# Patient Record
Sex: Male | Born: 1950 | Race: White | Hispanic: No | Marital: Married | Smoking: Former smoker
Health system: Southern US, Community
[De-identification: ages and names within clinical notes are randomized; demographics above are authoritative.]

## PROBLEM LIST (undated history)

## (undated) DIAGNOSIS — I10 Essential (primary) hypertension: Secondary | ICD-10-CM

## (undated) DIAGNOSIS — E119 Type 2 diabetes mellitus without complications: Secondary | ICD-10-CM

## (undated) DIAGNOSIS — I639 Cerebral infarction, unspecified: Secondary | ICD-10-CM

## (undated) DIAGNOSIS — I2510 Atherosclerotic heart disease of native coronary artery without angina pectoris: Secondary | ICD-10-CM

## (undated) DIAGNOSIS — C801 Malignant (primary) neoplasm, unspecified: Secondary | ICD-10-CM

## (undated) HISTORY — PX: OTHER SURGICAL HISTORY: SHX169

## (undated) HISTORY — PX: TUMOR EXCISION: SHX421

---

## 2013-08-10 DIAGNOSIS — L719 Rosacea, unspecified: Secondary | ICD-10-CM | POA: Insufficient documentation

## 2016-10-06 DIAGNOSIS — I639 Cerebral infarction, unspecified: Secondary | ICD-10-CM | POA: Insufficient documentation

## 2016-10-06 DIAGNOSIS — E785 Hyperlipidemia, unspecified: Secondary | ICD-10-CM | POA: Insufficient documentation

## 2016-11-26 DIAGNOSIS — I6509 Occlusion and stenosis of unspecified vertebral artery: Secondary | ICD-10-CM | POA: Insufficient documentation

## 2016-12-08 DIAGNOSIS — R7989 Other specified abnormal findings of blood chemistry: Secondary | ICD-10-CM | POA: Insufficient documentation

## 2016-12-08 DIAGNOSIS — R972 Elevated prostate specific antigen [PSA]: Secondary | ICD-10-CM | POA: Insufficient documentation

## 2016-12-08 DIAGNOSIS — N419 Inflammatory disease of prostate, unspecified: Secondary | ICD-10-CM | POA: Insufficient documentation

## 2018-09-23 DIAGNOSIS — I6930 Unspecified sequelae of cerebral infarction: Secondary | ICD-10-CM | POA: Insufficient documentation

## 2018-09-28 DIAGNOSIS — H2512 Age-related nuclear cataract, left eye: Secondary | ICD-10-CM | POA: Insufficient documentation

## 2018-10-19 DIAGNOSIS — H2511 Age-related nuclear cataract, right eye: Secondary | ICD-10-CM | POA: Insufficient documentation

## 2019-05-10 DIAGNOSIS — M5416 Radiculopathy, lumbar region: Secondary | ICD-10-CM | POA: Insufficient documentation

## 2019-05-10 DIAGNOSIS — M545 Low back pain, unspecified: Secondary | ICD-10-CM | POA: Insufficient documentation

## 2019-05-10 DIAGNOSIS — M47816 Spondylosis without myelopathy or radiculopathy, lumbar region: Secondary | ICD-10-CM | POA: Insufficient documentation

## 2019-06-28 DIAGNOSIS — F418 Other specified anxiety disorders: Secondary | ICD-10-CM | POA: Insufficient documentation

## 2020-02-22 ENCOUNTER — Ambulatory Visit
Admission: EM | Admit: 2020-02-22 | Discharge: 2020-02-22 | Disposition: A | Discharge status: Home / Self Care | Payer: Medicare HMO

## 2020-02-22 ENCOUNTER — Other Ambulatory Visit: Payer: Self-pay

## 2020-02-22 ENCOUNTER — Encounter: Payer: Self-pay | Admitting: Emergency Medicine

## 2020-02-22 DIAGNOSIS — S61402A Unspecified open wound of left hand, initial encounter: Secondary | ICD-10-CM

## 2020-02-22 DIAGNOSIS — L03011 Cellulitis of right finger: Secondary | ICD-10-CM | POA: Diagnosis not present

## 2020-02-22 HISTORY — DX: Essential (primary) hypertension: I10

## 2020-02-22 HISTORY — DX: Type 2 diabetes mellitus without complications: E11.9

## 2020-02-22 HISTORY — DX: Malignant (primary) neoplasm, unspecified: C80.1

## 2020-02-22 HISTORY — DX: Cerebral infarction, unspecified: I63.9

## 2020-02-22 HISTORY — DX: Atherosclerotic heart disease of native coronary artery without angina pectoris: I25.10

## 2020-02-22 MED ORDER — TETANUS-DIPHTH-ACELL PERTUSSIS 5-2.5-18.5 LF-MCG/0.5 IM SUSP
0.5000 mL | Freq: Once | INTRAMUSCULAR | Status: AC
  Administered 2020-02-22: 20:00:00 0.5 mL via INTRAMUSCULAR

## 2020-02-22 MED ORDER — DOXYCYCLINE HYCLATE 100 MG PO CAPS: 100.0000 mg | ORAL_CAPSULE | Freq: Two times a day (BID) | ORAL | 0 refills | Status: DC

## 2020-02-22 NOTE — ED Provider Notes (Signed)
[place]   [id number] 02/22/20 Arrival Time: [id number]  CC:  Skin infection; wound check  SUBJECTIVE:  Robert Brown is a 69 y.o. male who presents with a possible infection of RT ring finger x 4 days. Had hang nail he was picking at, and then started having redness and swelling.  Localizes the infection to RT ring finger.  Describes it as painful, red, and swelling.  Has tried expressing/ picking at it at home without relief.  Symptoms are made worse to the touch.  Denies fever, chills, nausea, vomiting, changes in bowel or bladder function.    Also reports wound to left hand x 1 day.  Occurred after catching inside of hand on screen door.  Has tried covering, but bandage always comes off.    Unknown last tetanus.    ROS: As per HPI.  All other pertinent ROS negative.     Past Medical History:  Diagnosis Date  . CAD (coronary artery disease)   . Cancer ([place])   . Diabetes mellitus without complication ([place])   . Hypertension   . Stroke [facility])    Past Surgical History:  Procedure Laterality Date  . bone grafts    . TUMOR EXCISION     No Known Allergies No current facility-administered medications on file prior to encounter.   Current Outpatient Medications on File Prior to Encounter  Medication Sig Dispense Refill  . finasteride (PROSCAR) 5 MG tablet     . isosorbide mononitrate (IMDUR) 30 MG 24 hr tablet Take by mouth.    . losartan (COZAAR) 100 MG tablet Take by mouth.    . metFORMIN (GLUCOPHAGE-XR) 500 MG 24 hr tablet TAKE 2 TABLETS BY MOUTH WITH BREAKFAST    . aspirin 325 MG tablet Take by mouth.    . tamsulosin (FLOMAX) 0.4 MG CAPS capsule Take 0.4 mg by mouth 2 (two) times daily.     Social History   Socioeconomic History  . Marital status: Married    Spouse name: Not on file  . Number of children: Not on file  . Years of education: Not on file  . Highest education level: Not on file  Occupational History  . Not on file  Tobacco Use  . Smoking status:  Never Smoker  Substance and Sexual Activity  . Alcohol use: Never  . Drug use: Never  . Sexual activity: Not on file  Other Topics Concern  . Not on file  Social History Narrative  . Not on file   Social Determinants of Health   Financial Resource Strain:   . Difficulty of Paying Living Expenses:   Food Insecurity:   . Worried About Charity fundraiser in the Last Year:   . Arboriculturist in the Last Year:   Transportation Needs:   . Film/video editor (Medical):   Marland Kitchen Lack of Transportation (Non-Medical):   Physical Activity:   . Days of Exercise per Week:   . Minutes of Exercise per Session:   Stress:   . Feeling of Stress :   Social Connections:   . Frequency of Communication with Friends and Family:   . Frequency of Social Gatherings with Friends and Family:   . Attends Religious Services:   . Active Member of Clubs or Organizations:   . Attends Archivist Meetings:   Marland Kitchen Marital Status:   Intimate Partner Violence:   . Fear of Current or Ex-Partner:   . Emotionally Abused:   Marland Kitchen Physically Abused:   .  Sexually Abused:    Family History  Problem Relation Age of Onset  . Diabetes Mother   . Diabetes Father     OBJECTIVE: Vitals:   02/22/20 1804  BP: (!) 162/83  Pulse: 75  Resp: 20  Temp: 97.6 F (36.4 C)  TempSrc: Oral  SpO2: 92%    General appearance: alert; no distress Head: NCAT Lungs: Normal respiratory effort Heart:  Radial pulse 2+ bilaterally Extremities: no edema Skin: warm and dry; paronychia present to medial aspect of RT fourth digit with surrounding erythema and swelling, no obvious drainage or bleeding, scab formation; healed avulsion skin tear < 1 cm in length to medial aspect of LT hand, scab formation, no obvious bleeding or discharge Psychological: alert and cooperative; normal mood and affect  ASSESSMENT & PLAN:  1. Acute paronychia of finger of right hand   2. Avulsion of skin of left hand, initial encounter     Meds  ordered this encounter  Medications  . doxycycline (VIBRAMYCIN) 100 MG capsule    Sig: Take 1 capsule (100 mg total) by mouth 2 (two) times daily.    Dispense:  20 capsule    Refill:  0    Order Specific Question:   Supervising Provider    Answer:   Raylene Everts [id number]    Perform frequent warm soak to help facilitate drainage Wash daily with warm water and mild soap Keep covered to avoid secondary infection Dressing applied Doxycycline prescribed for infection.  Take as directed and to completion Use OTC ibuprofen/tylenol as needed for pain  Return or go to the ED if you have any new or worsening symptoms such as worsening toe pain, nausea, vomiting, increased redness, swelling, fever, chills, etc...  Reviewed expectations re: course of current medical issues. Questions answered. Outlined signs and symptoms indicating need for more acute intervention. Patient verbalized understanding. After Visit Summary given.   Lestine Box, PA-C 02/22/20 1909

## 2020-02-22 NOTE — Discharge Instructions (Signed)
Perform frequent warm soak to help facilitate drainage Wash daily with warm water and mild soap Keep covered to avoid secondary infection Dressing applied Doxycycline prescribed for infection.  Take as directed and to completion Use OTC ibuprofen/tylenol as needed for pain  Return or go to the ED if you have any new or worsening symptoms such as worsening toe pain, nausea, vomiting, increased redness, swelling, fever, chills, etc..Marland Kitchen

## 2020-02-22 NOTE — ED Triage Notes (Addendum)
Patient has a paronychia to right ring finger.  Patient thought there was a hang nail, but pulled it.  Now there is redness, swelling and pain to area around nail.  This injury for 4 days.    Left hand has a skin laceration to lateral left hand, just behind little finger. Looks like a skin tear, wound looks closed.  This occurred yesterday .  Injured with a screen door  Unknown last tetanus

## 2020-03-06 ENCOUNTER — Ambulatory Visit
Admission: EM | Admit: 2020-03-06 | Discharge: 2020-03-06 | Disposition: A | Discharge status: Home / Self Care | Payer: Medicare HMO | Attending: Emergency Medicine | Admitting: Emergency Medicine

## 2020-03-06 ENCOUNTER — Other Ambulatory Visit: Payer: Self-pay

## 2020-03-06 ENCOUNTER — Encounter: Payer: Self-pay | Admitting: Emergency Medicine

## 2020-03-06 ENCOUNTER — Ambulatory Visit (INDEPENDENT_AMBULATORY_CARE_PROVIDER_SITE_OTHER): Payer: Medicare HMO

## 2020-03-06 DIAGNOSIS — L03011 Cellulitis of right finger: Secondary | ICD-10-CM

## 2020-03-06 MED ORDER — CEFTRIAXONE SODIUM 1 G IJ SOLR
1.0000 g | Freq: Once | INTRAMUSCULAR | Status: AC
  Administered 2020-03-06: 1 g via INTRAMUSCULAR

## 2020-03-06 MED ORDER — CEPHALEXIN 500 MG PO CAPS: 500.0000 mg | ORAL_CAPSULE | Freq: Four times a day (QID) | ORAL | 0 refills | Status: DC

## 2020-03-06 NOTE — Discharge Instructions (Addendum)
Perform frequent warm soaks to help facilitate drainage Wash daily with warm water and mild soap Keep covered to avoid secondary infection Dressing applied Rocephin 1 g was given in office keflex will be prescribed Use OTC ibuprofen/Tylenol as needed for pain relief Return or go to ED if you have any new or worsening symptoms such as worsening pain, nausea, vomiting, increased redness, swelling, fever, chills, etc..Marland KitchenMarland Kitchen

## 2020-03-06 NOTE — ED Provider Notes (Signed)
[facility]    CSN: [id number] Arrival date & time: 03/06/20  1317      History   Chief Complaint Chief Complaint  Patient presents with  . Finger Injury    HPI Robert Brown is a 69 y.o. male.   With hx of Diabetes present to the urgent care with a possible infection of the right ring finger for the past 17 days.  Was seen previously seen at the urgent care on 02/22/2020 and was prescribed doxycycline.  Reports symptom has been getting worse.  Localized infection to the right ring finger.  Described as painful red and swollen.  Has tried to express it at home without relief.  Symptoms are made worse to touch.  Denies fever, chills, nausea, vomiting, diarrhea, change in bladder function.  The history is provided by the patient. No language interpreter was used.    Past Medical History:  Diagnosis Date  . CAD (coronary artery disease)   . Cancer ([place])   . Diabetes mellitus without complication ([place])   . Hypertension   . Stroke [facility])     There are no problems to display for this patient.   Past Surgical History:  Procedure Laterality Date  . bone grafts    . TUMOR EXCISION         Home Medications    Prior to Admission medications   Medication Sig Start Date End Date Taking? Authorizing Provider  aspirin 325 MG tablet Take by mouth.    [provider]  doxycycline (VIBRAMYCIN) 100 MG capsule Take 1 capsule (100 mg total) by mouth 2 (two) times daily. 02/22/20   Wurst, [place], PA-C  finasteride (PROSCAR) 5 MG tablet  02/09/20   [provider]  isosorbide mononitrate (IMDUR) 30 MG 24 hr tablet Take by mouth. 01/26/20   [provider]  losartan (COZAAR) 100 MG tablet Take by mouth. 01/12/20   [provider]  metFORMIN (GLUCOPHAGE-XR) 500 MG 24 hr tablet TAKE 2 TABLETS BY MOUTH WITH BREAKFAST 01/12/20   [provider]  tamsulosin (FLOMAX) 0.4 MG CAPS capsule Take 0.4 mg by mouth 2 (two) times daily. 12/23/19    [provider]    Family History Family History  Problem Relation Age of Onset  . Diabetes Mother   . Diabetes Father     Social History Social History   Tobacco Use  . Smoking status: Never Smoker  Substance Use Topics  . Alcohol use: Never  . Drug use: Never     Allergies   Patient has no known allergies.   Review of Systems Review of Systems  Constitutional: Negative.   Respiratory: Negative.   Cardiovascular: Negative.   Skin: Positive for color change and wound.  All other systems reviewed and are negative.    Physical Exam Triage Vital Signs ED Triage Vitals  Enc Vitals Group     BP      Pulse      Resp      Temp      Temp src      SpO2      Weight      Height      Head Circumference      Peak Flow      Pain Score      Pain Loc      Pain Edu?      Excl. in [place]?    No data found.  Updated Vital Signs BP (!) 164/80 (BP Location: Right Arm)  Pulse 71   Temp 97.8 F (36.6 C) (Oral)   Resp 18   SpO2 96%   Visual Acuity Right Eye Distance:   Left Eye Distance:   Bilateral Distance:    Right Eye Near:   Left Eye Near:    Bilateral Near:     Physical Exam Vitals and nursing note reviewed.  Constitutional:      General: He is not in acute distress.    Appearance: Normal appearance. He is normal weight. He is not ill-appearing, toxic-appearing or diaphoretic.  Cardiovascular:     Rate and Rhythm: Normal rate and regular rhythm.     Pulses: Normal pulses.     Heart sounds: Normal heart sounds. No murmur. No friction rub. No gallop.   Pulmonary:     Effort: Pulmonary effort is normal. No respiratory distress.     Breath sounds: Normal breath sounds. No stridor. No wheezing, rhonchi or rales.  Chest:     Chest wall: No tenderness.  Musculoskeletal:     Right hand: Swelling and tenderness present. Normal sensation.  Skin:    General: Skin is warm.     Findings: Erythema present.     Comments: Paronychia present to medial  aspect of right fourth digit with surrounding erythema and swelling.  No obvious drainage.  Bleeding present.  Scab was removed.  Neurological:     Mental Status: He is alert.      UC Treatments / Results  Labs (all labs ordered are listed, but only abnormal results are displayed) Labs Reviewed - No data to display  EKG   Radiology DG Finger Ring Right  Result Date: 03/06/2020 CLINICAL DATA:  Worsening infection around the right 4th fingernail. EXAM: RIGHT RING FINGER 2+V COMPARISON:  None. FINDINGS: There is no evidence of fracture or dislocation. There is no evidence of arthropathy or other focal bone abnormality. Soft tissues are unremarkable. IMPRESSION: Normal examination.  No soft tissue gas or changes of osteomyelitis. Electronically Signed   By: Claudie Revering M.D.   On: 03/06/2020 14:40    Procedures Procedures (including critical care time)  Medications Ordered in UC Medications  cefTRIAXone (ROCEPHIN) injection 1 g (1 g Intramuscular Given 03/06/20 1409)    Initial Impression / Assessment and Plan / UC Course  I have reviewed the triage vital signs and the nursing notes.  Pertinent labs & imaging results that were available during my care of the patient were reviewed by me and considered in my medical decision making (see chart for details).     X-ray is negative for soft tissue gas or changes of osteomyelitis.  I have reviewed the x-ray myself and the radiologist interpretation.  I am in agreement with the radiologist interpretation.  Patient is stable for discharge.  Rocephin 1 g IM was given in office.  Keflex will be prescribed.  Was advised to follow-up with PCP if symptoms does not resolve for further evaluation.  Final Clinical Impressions(s) / UC Diagnoses   Final diagnoses:  Acute paronychia of finger of right hand     Discharge Instructions     Perform frequent warm soaks to help facilitate drainage Wash daily with warm water and mild soap Keep  covered to avoid secondary infection Dressing applied Rocephin 1 g was given in office keflex will be prescribed Use OTC ibuprofen/Tylenol as needed for pain relief Return or go to ED if you have any new or worsening symptoms such as worsening pain, nausea, vomiting, increased redness, swelling, fever, chills,  etc..Marland KitchenMarland Kitchen    ED Prescriptions    None     PDMP not reviewed this encounter.   Emerson [place], [place] 03/06/20 1447

## 2020-03-06 NOTE — ED Triage Notes (Signed)
Pt sts seen here on 4/7 of infection around right ring finger nail; pt sts was given antibiotics but finger has increased pain and redness; pt with some drainage noted

## 2020-06-21 ENCOUNTER — Encounter (HOSPITAL_COMMUNITY): Payer: Self-pay | Admitting: Emergency Medicine

## 2020-06-21 ENCOUNTER — Emergency Department (HOSPITAL_COMMUNITY): Payer: Medicare HMO

## 2020-06-21 ENCOUNTER — Emergency Department (HOSPITAL_COMMUNITY)
Admission: EM | Admit: 2020-06-21 | Discharge: 2020-06-21 | Disposition: A | Discharge status: Home / Self Care | Payer: Medicare HMO | Attending: Emergency Medicine | Admitting: Emergency Medicine

## 2020-06-21 ENCOUNTER — Other Ambulatory Visit: Payer: Self-pay

## 2020-06-21 DIAGNOSIS — R509 Fever, unspecified: Secondary | ICD-10-CM | POA: Diagnosis not present

## 2020-06-21 DIAGNOSIS — I10 Essential (primary) hypertension: Secondary | ICD-10-CM | POA: Diagnosis not present

## 2020-06-21 DIAGNOSIS — Z7982 Long term (current) use of aspirin: Secondary | ICD-10-CM | POA: Diagnosis not present

## 2020-06-21 DIAGNOSIS — T63331A Toxic effect of venom of brown recluse spider, accidental (unintentional), initial encounter: Secondary | ICD-10-CM | POA: Insufficient documentation

## 2020-06-21 DIAGNOSIS — E119 Type 2 diabetes mellitus without complications: Secondary | ICD-10-CM | POA: Diagnosis not present

## 2020-06-21 DIAGNOSIS — Z7984 Long term (current) use of oral hypoglycemic drugs: Secondary | ICD-10-CM | POA: Diagnosis not present

## 2020-06-21 DIAGNOSIS — I2510 Atherosclerotic heart disease of native coronary artery without angina pectoris: Secondary | ICD-10-CM | POA: Diagnosis not present

## 2020-06-21 DIAGNOSIS — Z79899 Other long term (current) drug therapy: Secondary | ICD-10-CM | POA: Insufficient documentation

## 2020-06-21 DIAGNOSIS — R21 Rash and other nonspecific skin eruption: Secondary | ICD-10-CM | POA: Diagnosis not present

## 2020-06-21 DIAGNOSIS — L03313 Cellulitis of chest wall: Secondary | ICD-10-CM | POA: Insufficient documentation

## 2020-06-21 DIAGNOSIS — Z859 Personal history of malignant neoplasm, unspecified: Secondary | ICD-10-CM | POA: Diagnosis not present

## 2020-06-21 DIAGNOSIS — R609 Edema, unspecified: Secondary | ICD-10-CM | POA: Diagnosis present

## 2020-06-21 LAB — CBC WITH DIFFERENTIAL/PLATELET
Abs Immature Granulocytes: 0.07 10*3/uL (ref 0.00–0.07)
Basophils Absolute: 0 10*3/uL (ref 0.0–0.1)
Basophils Relative: 0 %
Eosinophils Absolute: 0.4 10*3/uL (ref 0.0–0.5)
Eosinophils Relative: 4 %
HCT: 41.1 % (ref 39.0–52.0)
Hemoglobin: 12.7 g/dL — ABNORMAL LOW (ref 13.0–17.0)
Immature Granulocytes: 1 %
Lymphocytes Relative: 14 %
Lymphs Abs: 1.4 10*3/uL (ref 0.7–4.0)
MCH: 27.8 pg (ref 26.0–34.0)
MCHC: 30.9 g/dL (ref 30.0–36.0)
MCV: 89.9 fL (ref 80.0–100.0)
Monocytes Absolute: 0.9 10*3/uL (ref 0.1–1.0)
Monocytes Relative: 8 %
Neutro Abs: 7.7 10*3/uL (ref 1.7–7.7)
Neutrophils Relative %: 73 %
Platelets: 158 10*3/uL (ref 150–400)
RBC: 4.57 MIL/uL (ref 4.22–5.81)
RDW: 13.9 % (ref 11.5–15.5)
WBC: 10.5 10*3/uL (ref 4.0–10.5)
nRBC: 0 % (ref 0.0–0.2)

## 2020-06-21 LAB — COMPREHENSIVE METABOLIC PANEL
ALT: 17 U/L (ref 0–44)
AST: 14 U/L — ABNORMAL LOW (ref 15–41)
Albumin: 3.4 g/dL — ABNORMAL LOW (ref 3.5–5.0)
Alkaline Phosphatase: 95 U/L (ref 38–126)
Anion gap: 10 (ref 5–15)
BUN: 20 mg/dL (ref 8–23)
CO2: 25 mmol/L (ref 22–32)
Calcium: 8.6 mg/dL — ABNORMAL LOW (ref 8.9–10.3)
Chloride: 100 mmol/L (ref 98–111)
Creatinine, Ser: 1.3 mg/dL — ABNORMAL HIGH (ref 0.61–1.24)
GFR calc Af Amer: >60 mL/min (ref >60)
GFR calc non Af Amer: 56 mL/min — ABNORMAL LOW (ref >60)
Glucose, Bld: 224 mg/dL — ABNORMAL HIGH (ref 70–99)
Potassium: 3.7 mmol/L (ref 3.5–5.1)
Sodium: 135 mmol/L (ref 135–145)
Total Bilirubin: 0.5 mg/dL (ref 0.3–1.2)
Total Protein: 7.1 g/dL (ref 6.5–8.1)

## 2020-06-21 LAB — LACTIC ACID, PLASMA: Lactic Acid, Venous: 1.3 mmol/L (ref 0.5–1.9)

## 2020-06-21 MED ORDER — DOXYCYCLINE HYCLATE 100 MG PO CAPS: 100.0000 mg | ORAL_CAPSULE | Freq: Two times a day (BID) | ORAL | 0 refills | Status: DC

## 2020-06-21 MED ORDER — DIPHENHYDRAMINE HCL 25 MG PO CAPS
25.0000 mg | ORAL_CAPSULE | Freq: Once | ORAL | Status: AC
  Administered 2020-06-21: 25 mg via ORAL
  Filled 2020-06-21: qty 1

## 2020-06-21 MED ORDER — DOXYCYCLINE HYCLATE 100 MG PO TABS
100.0000 mg | ORAL_TABLET | Freq: Once | ORAL | Status: AC
  Administered 2020-06-21: 100 mg via ORAL
  Filled 2020-06-21: qty 1

## 2020-06-21 NOTE — Discharge Instructions (Addendum)
Please take the doxycycline antibiotics, as directed.  I would like you to continue monitoring your evidence of infection.   As of today's encounter, there is no abscess that is amenable to drainage.  You do have induration and skin changes concerning for bacterial infection.  You have been taking Bactrim, yet your symptoms have continued to worsen.  I would like for you to discontinue your Bactrim and take doxycycline instead.  Please return to the ED or seek immediate medical attention if your symptoms continue to worsen.  If you develop any fevers or chills, that could also suggest systemic illness.  Check your temperature regularly.

## 2020-06-21 NOTE — ED Provider Notes (Signed)
[facility] EMERGENCY DEPARTMENT Provider Note   CSN: [id number] Arrival date & time: 06/21/20  1446     History Chief Complaint  Patient presents with  . Insect Bite    Robert Brown is a 69 y.o. male with PMH significant for type II DM on Metformin, HTN, and HLD who presents the ED with complaints of insect bite.  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  He was evaluated by his [place] PA-C provider on 06/18/2020 and he has been taking his prescribed Bactrim each day since, with little improvement.  In fact, he states that his swelling and spreading redness has worsened.  He notes that he had mild drainage from the bite this morning, mostly bloody.  He also endorses intermittent fevers and chills.  He states that he had similar bites in the area of his groin a few weeks ago that have largely improved.  He denies any obvious tick bites.  HPI     Past Medical History:  Diagnosis Date  . CAD (coronary artery disease)   . Cancer ([place])   . Diabetes mellitus without complication ([place])   . Hypertension   . Stroke [facility])     There are no problems to display for this patient.   Past Surgical History:  Procedure Laterality Date  . bone grafts    . TUMOR EXCISION         Family History  Problem Relation Age of Onset  . Diabetes Mother   . Diabetes Father     Social History   Tobacco Use  . Smoking status: Never Smoker  . Smokeless tobacco: Never Used  Vaping Use  . Vaping Use: Never used  Substance Use Topics  . Alcohol use: Never  . Drug use: Never    Home Medications Prior to Admission medications   Medication Sig Start Date End Date Taking? Authorizing Provider  aspirin 325 MG tablet Take by mouth.    [provider]  cephALEXin (KEFLEX) 500 MG capsule Take 1 capsule (500 mg total) by mouth 4 (four) times daily. 03/06/20   Avegno, Darrelyn Hillock, FNP  doxycycline (VIBRAMYCIN) 100 MG capsule Take 1 capsule  (100 mg total) by mouth 2 (two) times daily. 06/21/20   Corena Herter, PA-C  finasteride (PROSCAR) 5 MG tablet  02/09/20   [provider]  isosorbide mononitrate (IMDUR) 30 MG 24 hr tablet Take by mouth. 01/26/20   [provider]  losartan (COZAAR) 100 MG tablet Take by mouth. 01/12/20   [provider]  metFORMIN (GLUCOPHAGE-XR) 500 MG 24 hr tablet TAKE 2 TABLETS BY MOUTH WITH BREAKFAST 01/12/20   [provider]  tamsulosin (FLOMAX) 0.4 MG CAPS capsule Take 0.4 mg by mouth 2 (two) times daily. 12/23/19   [provider]    Allergies    Patient has no known allergies.  Review of Systems   Review of Systems  Constitutional: Positive for chills and fever.  Respiratory: Negative for shortness of breath.   Cardiovascular: Negative for chest pain.  Skin: Positive for rash and wound.  Neurological: Negative for weakness and numbness.    Physical Exam Updated Vital Signs BP 128/81 (BP Location: Right Arm)   Pulse 76   Temp 98.5 F (36.9 C) (Oral)   Resp 20   Ht 5\' 10"  (1.778 m)   Wt 104.3 kg   SpO2 100%   BMI 33.00 kg/m   Physical Exam Vitals and  nursing note reviewed. Exam conducted with a chaperone present.  HENT:     Head: Normocephalic and atraumatic.  Eyes:     General: No scleral icterus.    Conjunctiva/sclera: Conjunctivae normal.  Cardiovascular:     Rate and Rhythm: Normal rate and regular rhythm.     Pulses: Normal pulses.     Heart sounds: Normal heart sounds.  Pulmonary:     Effort: Pulmonary effort is normal. No respiratory distress.     Breath sounds: Normal breath sounds.  Skin:    General: Skin is dry.     Capillary Refill: Capillary refill takes less than 2 seconds.     Comments: Large area of erythema and warmth extending from right axillary region towards right breast.  5 x 2 cm area of induration around central bite lesion.  Small area of mild necrosis, no significant ulceration.  Nonbleeding.  No active  drainage.  No obvious fluctuance noted.  Neurological:     Mental Status: He is alert and oriented to person, place, and time.     GCS: GCS eye subscore is 4. GCS verbal subscore is 5. GCS motor subscore is 6.  Psychiatric:        Mood and Affect: Mood normal.        Behavior: Behavior normal.        Thought Content: Thought content normal.         ED Results / Procedures / Treatments   Labs (all labs ordered are listed, but only abnormal results are displayed) Labs Reviewed  COMPREHENSIVE METABOLIC PANEL - Abnormal; Notable for the following components:      Result Value   Glucose, Bld 224 (*)    Creatinine, Ser 1.30 (*)    Calcium 8.6 (*)    Albumin 3.4 (*)    AST 14 (*)    GFR calc non Af Amer 56 (*)    All other components within normal limits  CBC WITH DIFFERENTIAL/PLATELET - Abnormal; Notable for the following components:   Hemoglobin 12.7 (*)    All other components within normal limits  LACTIC ACID, PLASMA  URINALYSIS, ROUTINE W REFLEX MICROSCOPIC    EKG None  Radiology DG Chest 2 View  Result Date: 06/21/2020 CLINICAL DATA:  Possible spider bite and cellulitis. EXAM: CHEST - 2 VIEW COMPARISON:  None. FINDINGS: The heart size and mediastinal contours are within normal limits. Both lungs are clear. The visualized skeletal structures are unremarkable. IMPRESSION: No active cardiopulmonary disease. Electronically Signed   By: Marijo Conception M.D.   On: 06/21/2020 15:34    Procedures Procedures (including critical care time)  Medications Ordered in ED Medications  doxycycline (VIBRA-TABS) tablet 100 mg (has no administration in time range)  diphenhydrAMINE (BENADRYL) capsule 25 mg (25 mg Oral Provided for home use 06/21/20 2237)    ED Course  I have reviewed the triage vital signs and the nursing notes.  Pertinent labs & imaging results that were available during my care of the patient were reviewed by me and considered in my medical decision making (see chart  for details).    MDM Rules/Calculators/A&P                          Patient presents to the ED with history and physical exam suggestive of cellulitis infection.  I placed an ultrasound on the area which revealed cobblestoning concerning for cellulitis, but no large fluid collections concerning for an abscess that would  otherwise be amenable to drainage.  Patient's laboratory work-up was notable for hyperglycemia 224, but otherwise largely unremarkable.  His lactic acid was WNL and his WBC borderline at 10.5.  No large leukocytosis.  His vital signs have been stable and WNL here in the ED.  No fevers or tachycardia.  Patient had been started on Bactrim and he has not had improvement.  Rather than concluding that he has failed outpatient therapy, feels it is reasonable to first discharge him home with doxycycline to see if that helps to remedy his infection.  If the redness and swelling continues to extend, he can then return to the ED for possible admission for IV antibiotics.  He is not ill-appearing at this time.  Discussed case with Dr. Karle Starch who saw images and agrees that patient is reasonable for discharge.  Patient be placed on doxycycline and encouraged him to continue monitoring for spreading infection.  If patient develops fevers and his condition worsens, he is strongly encouraged to return to the ED for repeat work-up.  At that point, I feel as though he would warrant admission for IV antibiotics.  All of the evaluation and work-up results were discussed with the patient and any family at bedside.  Patient and/or family were informed that while patient is appropriate for discharge at this time, some medical emergencies may only develop or become detectable after a period of time.  I specifically instructed patient and/or family to return to return to the ED or seek immediate medical attention for any new or worsening symptoms.  They were provided opportunity to ask any additional questions  and have none at this time.  Prior to discharge patient is feeling well, agreeable with plan for discharge home.  They have expressed understanding of verbal discharge instructions as well as return precautions and are agreeable to the plan.    Final Clinical Impression(s) / ED Diagnoses Final diagnoses:  Cellulitis of chest wall  Brown recluse spider bite or sting, accidental or unintentional, initial encounter    Rx / DC Orders ED Discharge Orders         Ordered    doxycycline (VIBRAMYCIN) 100 MG capsule  2 times daily     Discontinue  Reprint     06/21/20 2251           Corena Herter, PA-C 06/21/20 2253    Truddie Hidden, MD 06/22/20 [phone]

## 2020-06-21 NOTE — ED Notes (Signed)
Pt aggressive and yelling at staff about not having his wife in the back with him. Explained to pt that due to our policy with covid, pts in the hallway are not allowed to have visitors for their safety. Pt continues to be disrespectful towards staff after explanation. EDP made aware and stated he would see him next.

## 2020-06-21 NOTE — ED Triage Notes (Signed)
Pt c/o a potential spider bite on RT chest. Pt states Home Health came and evaluated him and thought it was brown recluse bite. Pt started on antibiotics. Pt reports worsening pain and swelling and subjective fever.

## 2021-05-25 ENCOUNTER — Encounter (HOSPITAL_COMMUNITY): Payer: Self-pay

## 2021-05-25 ENCOUNTER — Other Ambulatory Visit: Payer: Self-pay

## 2021-05-25 ENCOUNTER — Observation Stay (HOSPITAL_COMMUNITY)
Admission: EM | Admit: 2021-05-25 | Discharge: 2021-05-27 | Disposition: A | Discharge status: Home / Self Care | Payer: Medicare HMO | Attending: Internal Medicine | Admitting: Internal Medicine

## 2021-05-25 ENCOUNTER — Emergency Department (HOSPITAL_COMMUNITY): Payer: Medicare HMO

## 2021-05-25 DIAGNOSIS — I2510 Atherosclerotic heart disease of native coronary artery without angina pectoris: Secondary | ICD-10-CM | POA: Diagnosis present

## 2021-05-25 DIAGNOSIS — Z8673 Personal history of transient ischemic attack (TIA), and cerebral infarction without residual deficits: Secondary | ICD-10-CM | POA: Insufficient documentation

## 2021-05-25 DIAGNOSIS — R0902 Hypoxemia: Secondary | ICD-10-CM

## 2021-05-25 DIAGNOSIS — Z8546 Personal history of malignant neoplasm of prostate: Secondary | ICD-10-CM | POA: Diagnosis not present

## 2021-05-25 DIAGNOSIS — E119 Type 2 diabetes mellitus without complications: Secondary | ICD-10-CM

## 2021-05-25 DIAGNOSIS — E1159 Type 2 diabetes mellitus with other circulatory complications: Secondary | ICD-10-CM

## 2021-05-25 DIAGNOSIS — C61 Malignant neoplasm of prostate: Secondary | ICD-10-CM | POA: Diagnosis present

## 2021-05-25 DIAGNOSIS — Z7984 Long term (current) use of oral hypoglycemic drugs: Secondary | ICD-10-CM | POA: Insufficient documentation

## 2021-05-25 DIAGNOSIS — U071 COVID-19: Secondary | ICD-10-CM | POA: Diagnosis not present

## 2021-05-25 DIAGNOSIS — E785 Hyperlipidemia, unspecified: Secondary | ICD-10-CM | POA: Diagnosis not present

## 2021-05-25 DIAGNOSIS — J9601 Acute respiratory failure with hypoxia: Secondary | ICD-10-CM | POA: Diagnosis not present

## 2021-05-25 DIAGNOSIS — R0602 Shortness of breath: Secondary | ICD-10-CM | POA: Diagnosis not present

## 2021-05-25 DIAGNOSIS — E039 Hypothyroidism, unspecified: Secondary | ICD-10-CM | POA: Diagnosis present

## 2021-05-25 DIAGNOSIS — Z79899 Other long term (current) drug therapy: Secondary | ICD-10-CM | POA: Diagnosis not present

## 2021-05-25 DIAGNOSIS — I10 Essential (primary) hypertension: Secondary | ICD-10-CM | POA: Diagnosis not present

## 2021-05-25 DIAGNOSIS — J449 Chronic obstructive pulmonary disease, unspecified: Secondary | ICD-10-CM | POA: Diagnosis not present

## 2021-05-25 DIAGNOSIS — Z7982 Long term (current) use of aspirin: Secondary | ICD-10-CM | POA: Diagnosis not present

## 2021-05-25 DIAGNOSIS — R059 Cough, unspecified: Secondary | ICD-10-CM | POA: Diagnosis present

## 2021-05-25 LAB — LACTIC ACID, PLASMA
Lactic Acid, Venous: 1.4 mmol/L (ref 0.5–1.9)
Lactic Acid, Venous: 1.5 mmol/L (ref 0.5–1.9)

## 2021-05-25 LAB — COMPREHENSIVE METABOLIC PANEL
ALT: 21 U/L (ref 0–44)
AST: 22 U/L (ref 15–41)
Albumin: 3.9 g/dL (ref 3.5–5.0)
Alkaline Phosphatase: 83 U/L (ref 38–126)
Anion gap: 11 (ref 5–15)
BUN: 18 mg/dL (ref 8–23)
CO2: 27 mmol/L (ref 22–32)
Calcium: 8.7 mg/dL — ABNORMAL LOW (ref 8.9–10.3)
Chloride: 101 mmol/L (ref 98–111)
Creatinine, Ser: 1 mg/dL (ref 0.61–1.24)
GFR, Estimated: >60 mL/min (ref >60)
Glucose, Bld: 105 mg/dL — ABNORMAL HIGH (ref 70–99)
Potassium: 3.2 mmol/L — ABNORMAL LOW (ref 3.5–5.1)
Sodium: 139 mmol/L (ref 135–145)
Total Bilirubin: 0.7 mg/dL (ref 0.3–1.2)
Total Protein: 7.3 g/dL (ref 6.5–8.1)

## 2021-05-25 LAB — PROCALCITONIN: Procalcitonin: <0.1 ng/mL

## 2021-05-25 LAB — FERRITIN: Ferritin: 131 ng/mL (ref 24–336)

## 2021-05-25 LAB — URINALYSIS, ROUTINE W REFLEX MICROSCOPIC
Bacteria, UA: NONE SEEN
Bilirubin Urine: NEGATIVE
Glucose, UA: NEGATIVE mg/dL
Ketones, ur: NEGATIVE mg/dL
Nitrite: NEGATIVE
Protein, ur: NEGATIVE mg/dL
Specific Gravity, Urine: 1.013 (ref 1.005–1.030)
pH: 7 (ref 5.0–8.0)

## 2021-05-25 LAB — CBC WITH DIFFERENTIAL/PLATELET
Abs Immature Granulocytes: 0.08 10*3/uL — ABNORMAL HIGH (ref 0.00–0.07)
Basophils Absolute: 0.1 10*3/uL (ref 0.0–0.1)
Basophils Relative: 0 %
Eosinophils Absolute: 0.2 10*3/uL (ref 0.0–0.5)
Eosinophils Relative: 1 %
HCT: 43.2 % (ref 39.0–52.0)
Hemoglobin: 14.1 g/dL (ref 13.0–17.0)
Immature Granulocytes: 1 %
Lymphocytes Relative: 6 %
Lymphs Abs: 0.8 10*3/uL (ref 0.7–4.0)
MCH: 28.9 pg (ref 26.0–34.0)
MCHC: 32.6 g/dL (ref 30.0–36.0)
MCV: 88.5 fL (ref 80.0–100.0)
Monocytes Absolute: 1.2 10*3/uL — ABNORMAL HIGH (ref 0.1–1.0)
Monocytes Relative: 9 %
Neutro Abs: 11.3 10*3/uL — ABNORMAL HIGH (ref 1.7–7.7)
Neutrophils Relative %: 83 %
Platelets: ADEQUATE 10*3/uL (ref 150–400)
RBC: 4.88 MIL/uL (ref 4.22–5.81)
RDW: 13.7 % (ref 11.5–15.5)
WBC: 13.6 10*3/uL — ABNORMAL HIGH (ref 4.0–10.5)
nRBC: 0 % (ref 0.0–0.2)

## 2021-05-25 LAB — HEMOGLOBIN A1C
Hgb A1c MFr Bld: 7.2 % — ABNORMAL HIGH (ref 4.8–5.6)
Mean Plasma Glucose: 159.94 mg/dL

## 2021-05-25 LAB — RESP PANEL BY RT-PCR (FLU A&B, COVID) ARPGX2
Influenza A by PCR: NEGATIVE
Influenza B by PCR: NEGATIVE
SARS Coronavirus 2 by RT PCR: POSITIVE — AB

## 2021-05-25 LAB — MRSA NEXT GEN BY PCR, NASAL: MRSA by PCR Next Gen: NOT DETECTED

## 2021-05-25 LAB — HIV ANTIBODY (ROUTINE TESTING W REFLEX): HIV Screen 4th Generation wRfx: NONREACTIVE

## 2021-05-25 LAB — TROPONIN I (HIGH SENSITIVITY): Troponin I (High Sensitivity): 4 ng/L (ref <18)

## 2021-05-25 LAB — GLUCOSE, CAPILLARY
Glucose-Capillary: 115 mg/dL — ABNORMAL HIGH (ref 70–99)
Glucose-Capillary: 292 mg/dL — ABNORMAL HIGH (ref 70–99)
Glucose-Capillary: 302 mg/dL — ABNORMAL HIGH (ref 70–99)

## 2021-05-25 LAB — BRAIN NATRIURETIC PEPTIDE: B Natriuretic Peptide: 81 pg/mL (ref 0.0–100.0)

## 2021-05-25 LAB — D-DIMER, QUANTITATIVE: D-Dimer, Quant: 0.77 ug/mL-FEU — ABNORMAL HIGH (ref 0.00–0.50)

## 2021-05-25 LAB — C-REACTIVE PROTEIN: CRP: 2.1 mg/dL — ABNORMAL HIGH (ref <1.0)

## 2021-05-25 MED ORDER — ONDANSETRON HCL 4 MG/2ML IJ SOLN: 4.0000 mg | Freq: Four times a day (QID) | INTRAMUSCULAR | Status: DC | PRN

## 2021-05-25 MED ORDER — SODIUM CHLORIDE 0.9 % IV SOLN
100.0000 mg | Freq: Every day | INTRAVENOUS | Status: DC
  Administered 2021-05-26: 100 mg via INTRAVENOUS
  Filled 2021-05-25: qty 100
  Filled 2021-05-25 (×2): qty 20

## 2021-05-25 MED ORDER — AMLODIPINE BESYLATE 5 MG PO TABS
5.0000 mg | ORAL_TABLET | Freq: Two times a day (BID) | ORAL | Status: DC
  Administered 2021-05-25 – 2021-05-27 (×5): 5 mg via ORAL
  Filled 2021-05-25 (×5): qty 1

## 2021-05-25 MED ORDER — ONDANSETRON HCL 4 MG PO TABS: 4.0000 mg | ORAL_TABLET | Freq: Four times a day (QID) | ORAL | Status: DC | PRN

## 2021-05-25 MED ORDER — FINASTERIDE 5 MG PO TABS
5.0000 mg | ORAL_TABLET | Freq: Every day | ORAL | Status: DC
  Administered 2021-05-25 – 2021-05-27 (×3): 5 mg via ORAL
  Filled 2021-05-25 (×3): qty 1

## 2021-05-25 MED ORDER — REMDESIVIR 100 MG IV SOLR: 200.0000 mg | Freq: Once | INTRAVENOUS | Status: DC

## 2021-05-25 MED ORDER — SODIUM CHLORIDE 0.9 % IV SOLN: 100.0000 mg | Freq: Every day | INTRAVENOUS | Status: DC

## 2021-05-25 MED ORDER — MUPIROCIN 2 % EX OINT
1.0000 "application " | TOPICAL_OINTMENT | Freq: Two times a day (BID) | CUTANEOUS | Status: DC
  Administered 2021-05-25: 1 via NASAL
  Filled 2021-05-25: qty 22

## 2021-05-25 MED ORDER — ROSUVASTATIN CALCIUM 10 MG PO TABS
5.0000 mg | ORAL_TABLET | Freq: Every day | ORAL | Status: DC
  Administered 2021-05-25 – 2021-05-27 (×3): 5 mg via ORAL
  Filled 2021-05-25 (×3): qty 1

## 2021-05-25 MED ORDER — CARVEDILOL 12.5 MG PO TABS
25.0000 mg | ORAL_TABLET | Freq: Two times a day (BID) | ORAL | Status: DC
  Administered 2021-05-25 – 2021-05-27 (×5): 25 mg via ORAL
  Filled 2021-05-25 (×5): qty 2

## 2021-05-25 MED ORDER — HYDROCOD POLST-CPM POLST ER 10-8 MG/5ML PO SUER
5.0000 mL | Freq: Two times a day (BID) | ORAL | Status: DC | PRN
Start: 2021-05-25 — End: 2021-05-27
  Administered 2021-05-27: 5 mL via ORAL
  Filled 2021-05-25: qty 5

## 2021-05-25 MED ORDER — ALBUTEROL SULFATE HFA 108 (90 BASE) MCG/ACT IN AERS
2.0000 | INHALATION_SPRAY | RESPIRATORY_TRACT | Status: DC | PRN
  Administered 2021-05-25: 2 via RESPIRATORY_TRACT
  Filled 2021-05-25 (×2): qty 6.7

## 2021-05-25 MED ORDER — LEVOTHYROXINE SODIUM 100 MCG PO TABS
100.0000 ug | ORAL_TABLET | Freq: Every day | ORAL | Status: DC
  Administered 2021-05-25 – 2021-05-27 (×3): 100 ug via ORAL
  Filled 2021-05-25 (×2): qty 1
  Filled 2021-05-25: qty 2

## 2021-05-25 MED ORDER — TAMSULOSIN HCL 0.4 MG PO CAPS
0.4000 mg | ORAL_CAPSULE | Freq: Two times a day (BID) | ORAL | Status: DC
  Administered 2021-05-25 – 2021-05-27 (×5): 0.4 mg via ORAL
  Filled 2021-05-25 (×5): qty 1

## 2021-05-25 MED ORDER — ACETAMINOPHEN 325 MG PO TABS: 650.0000 mg | ORAL_TABLET | Freq: Four times a day (QID) | ORAL | Status: DC | PRN

## 2021-05-25 MED ORDER — CHLORHEXIDINE GLUCONATE CLOTH 2 % EX PADS: 6.0000 | MEDICATED_PAD | Freq: Every day | CUTANEOUS | Status: DC

## 2021-05-25 MED ORDER — ENOXAPARIN SODIUM 40 MG/0.4ML IJ SOSY
40.0000 mg | PREFILLED_SYRINGE | INTRAMUSCULAR | Status: DC
  Administered 2021-05-25 – 2021-05-27 (×3): 40 mg via SUBCUTANEOUS
  Filled 2021-05-25 (×3): qty 0.4

## 2021-05-25 MED ORDER — IPRATROPIUM-ALBUTEROL 20-100 MCG/ACT IN AERS
1.0000 | INHALATION_SPRAY | Freq: Four times a day (QID) | RESPIRATORY_TRACT | Status: DC
  Administered 2021-05-25: 1 via RESPIRATORY_TRACT
  Filled 2021-05-25: qty 4

## 2021-05-25 MED ORDER — GUAIFENESIN-DM 100-10 MG/5ML PO SYRP
10.0000 mL | ORAL_SOLUTION | ORAL | Status: DC | PRN
  Administered 2021-05-26 – 2021-05-27 (×2): 10 mL via ORAL
  Filled 2021-05-25 (×2): qty 10

## 2021-05-25 MED ORDER — SODIUM CHLORIDE 0.9 % IV SOLN
100.0000 mg | INTRAVENOUS | Status: AC
  Administered 2021-05-25 (×2): 100 mg via INTRAVENOUS
  Filled 2021-05-25 (×2): qty 20

## 2021-05-25 MED ORDER — IPRATROPIUM-ALBUTEROL 20-100 MCG/ACT IN AERS: 1.0000 | INHALATION_SPRAY | Freq: Four times a day (QID) | RESPIRATORY_TRACT | Status: DC | PRN

## 2021-05-25 MED ORDER — METHYLPREDNISOLONE SODIUM SUCC 125 MG IJ SOLR
0.5000 mg/kg | Freq: Two times a day (BID) | INTRAMUSCULAR | Status: DC
  Administered 2021-05-25 – 2021-05-27 (×5): 51.875 mg via INTRAVENOUS
  Filled 2021-05-25 (×5): qty 2

## 2021-05-25 MED ORDER — INSULIN ASPART 100 UNIT/ML IJ SOLN
0.0000 [IU] | Freq: Three times a day (TID) | INTRAMUSCULAR | Status: DC
  Administered 2021-05-25 – 2021-05-26 (×2): 8 [IU] via SUBCUTANEOUS
  Administered 2021-05-26 – 2021-05-27 (×4): 5 [IU] via SUBCUTANEOUS

## 2021-05-25 MED ORDER — INSULIN ASPART 100 UNIT/ML IJ SOLN
0.0000 [IU] | Freq: Every day | INTRAMUSCULAR | Status: DC
  Administered 2021-05-25: 4 [IU] via SUBCUTANEOUS
  Administered 2021-05-26: 2 [IU] via SUBCUTANEOUS

## 2021-05-25 MED ORDER — ISOSORBIDE MONONITRATE ER 60 MG PO TB24
60.0000 mg | ORAL_TABLET | Freq: Every day | ORAL | Status: DC
  Administered 2021-05-25 – 2021-05-27 (×3): 60 mg via ORAL
  Filled 2021-05-25 (×3): qty 1

## 2021-05-25 MED ORDER — EZETIMIBE 10 MG PO TABS
10.0000 mg | ORAL_TABLET | Freq: Every day | ORAL | Status: DC
  Administered 2021-05-25 – 2021-05-27 (×3): 10 mg via ORAL
  Filled 2021-05-25 (×3): qty 1

## 2021-05-25 MED ORDER — ASPIRIN EC 81 MG PO TBEC
81.0000 mg | DELAYED_RELEASE_TABLET | Freq: Every day | ORAL | Status: DC
  Administered 2021-05-25 – 2021-05-27 (×3): 81 mg via ORAL
  Filled 2021-05-25 (×3): qty 1

## 2021-05-25 MED ORDER — PREDNISONE 20 MG PO TABS: 50.0000 mg | ORAL_TABLET | Freq: Every day | ORAL | Status: DC

## 2021-05-25 NOTE — ED Notes (Signed)
Patient sitting on edge of bed. Alert & oriented X4, Currently awaiting bed assignment

## 2021-05-25 NOTE — ED Triage Notes (Signed)
Pt BIB [facility] from home for SOB. Also c/o nausea. Hx copd and CHF. No o2 at baseline, 87-91% on room air. Placed on 2LNC 95-97% on 2L.

## 2021-05-25 NOTE — ED Provider Notes (Signed)
[facility] EMERGENCY DEPARTMENT Provider Note   CSN: [id number] Arrival date & time: 05/25/21  7622     History Chief Complaint  Patient presents with   Cough    Robert Brown is a 70 y.o. male.   Cough Cough characteristics:  Dry Sputum characteristics:  White Severity:  Mild Onset quality:  Gradual Duration:  2 days Timing:  Constant Progression:  Worsening Chronicity:  New Smoker: no   Context: upper respiratory infection   Relieved by:  None tried Worsened by:  Nothing Ineffective treatments:  None tried Associated symptoms: chills, myalgias, shortness of breath and sore throat   Associated symptoms: no chest pain, no ear pain, no fever and no sinus congestion       Past Medical History:  Diagnosis Date   CAD (coronary artery disease)    Cancer ([place])    Diabetes mellitus without complication ([place])    Hypertension    Stroke [facility])     Patient Active Problem List   Diagnosis Date Noted   Acute respiratory failure with hypoxia ([place]) 05/25/2021    Past Surgical History:  Procedure Laterality Date   bone grafts     TUMOR EXCISION         Family History  Problem Relation Age of Onset   Diabetes Mother    Diabetes Father     Social History   Tobacco Use   Smoking status: Never   Smokeless tobacco: Never  Vaping Use   Vaping Use: Never used  Substance Use Topics   Alcohol use: Never   Drug use: Never    Home Medications Prior to Admission medications   Medication Sig Start Date End Date Taking? Authorizing Provider  aspirin 325 MG tablet Take by mouth.    [provider]  cephALEXin (KEFLEX) 500 MG capsule Take 1 capsule (500 mg total) by mouth 4 (four) times daily. 03/06/20   Avegno, Darrelyn Hillock, FNP  doxycycline (VIBRAMYCIN) 100 MG capsule Take 1 capsule (100 mg total) by mouth 2 (two) times daily. 06/21/20   Corena Herter, PA-C  finasteride (PROSCAR) 5 MG tablet  02/09/20   [provider]  isosorbide mononitrate (IMDUR)  30 MG 24 hr tablet Take by mouth. 01/26/20   [provider]  losartan (COZAAR) 100 MG tablet Take by mouth. 01/12/20   [provider]  metFORMIN (GLUCOPHAGE-XR) 500 MG 24 hr tablet TAKE 2 TABLETS BY MOUTH WITH BREAKFAST 01/12/20   [provider]  tamsulosin (FLOMAX) 0.4 MG CAPS capsule Take 0.4 mg by mouth 2 (two) times daily. 12/23/19   [provider]    Allergies    Lisinopril and Statins  Review of Systems   Review of Systems  Constitutional:  Positive for chills. Negative for fever.  HENT:  Positive for sore throat. Negative for ear pain.   Respiratory:  Positive for cough and shortness of breath.   Cardiovascular:  Negative for chest pain.  Musculoskeletal:  Positive for myalgias.  All other systems reviewed and are negative.  Physical Exam Updated Vital Signs BP (!) 151/87   Pulse 96   Temp 99.5 F (37.5 C) (Oral)   Resp (!) 24   Ht 5\' 10"  (1.778 m)   Wt 104 kg   SpO2 95%   BMI 32.90 kg/m   Physical Exam Vitals and nursing note reviewed.  Constitutional:      Appearance: He is well-developed.  HENT:     Head: Normocephalic and atraumatic.     Mouth/Throat:  Mouth: Mucous membranes are moist.     Pharynx: Oropharynx is clear.  Eyes:     Pupils: Pupils are equal, round, and reactive to light.  Cardiovascular:     Rate and Rhythm: Normal rate.  Pulmonary:     Effort: Pulmonary effort is normal. Tachypnea present. No respiratory distress.     Breath sounds: Wheezing present.  Abdominal:     General: There is no distension.  Musculoskeletal:        General: Normal range of motion.     Cervical back: Normal range of motion.  Skin:    General: Skin is warm and dry.     Coloration: Skin is not jaundiced or pale.  Neurological:     General: No focal deficit present.     Mental Status: He is alert.    ED Results / Procedures / Treatments   Labs (all labs ordered are listed, but only abnormal results are displayed) Labs  Reviewed  RESP PANEL BY RT-PCR (FLU A&B, COVID) ARPGX2 - Abnormal; Notable for the following components:      Result Value   SARS Coronavirus 2 by RT PCR POSITIVE (*)    All other components within normal limits  URINALYSIS, ROUTINE W REFLEX MICROSCOPIC - Abnormal; Notable for the following components:   Hgb urine dipstick SMALL (*)    Leukocytes,Ua SMALL (*)    All other components within normal limits  CBC WITH DIFFERENTIAL/PLATELET - Abnormal; Notable for the following components:   WBC 13.6 (*)    Neutro Abs 11.3 (*)    Monocytes Absolute 1.2 (*)    Abs Immature Granulocytes 0.08 (*)    All other components within normal limits  COMPREHENSIVE METABOLIC PANEL - Abnormal; Notable for the following components:   Potassium 3.2 (*)    Glucose, Bld 105 (*)    Calcium 8.7 (*)    All other components within normal limits  LACTIC ACID, PLASMA  BRAIN NATRIURETIC PEPTIDE  LACTIC ACID, PLASMA  TROPONIN I (HIGH SENSITIVITY)    EKG None  Radiology DG Chest 2 View  Result Date: 05/25/2021 CLINICAL DATA:  Shortness of breath, nausea. History of COPD and CHF. EXAM: CHEST - 2 VIEW COMPARISON:  Chest x-ray dated 06/21/2020. FINDINGS: Heart size and mediastinal contours are stable. Lungs are clear. No pleural effusion or pneumothorax is seen. Osseous structures about the chest are unremarkable. IMPRESSION: No active cardiopulmonary disease. No evidence of pneumonia or pulmonary edema. Electronically Signed   By: Franki Cabot M.D.   On: 05/25/2021 04:56    Procedures .Critical Care  Date/Time: 05/25/2021 5:16 AM Performed by: Merrily Pew, MD Authorized by: Merrily Pew, MD   Critical care provider statement:    Critical care time (minutes):  45   Critical care was necessary to treat or prevent imminent or life-threatening deterioration of the following conditions:  Respiratory failure   Critical care was time spent personally by me on the following activities:  Discussions with  consultants, evaluation of patient's response to treatment, examination of patient, ordering and performing treatments and interventions, ordering and review of laboratory studies, ordering and review of radiographic studies, pulse oximetry, re-evaluation of patient's condition, obtaining history from patient or surrogate and review of old charts  Medications Ordered in ED Medications  albuterol (VENTOLIN HFA) 108 (90 Base) MCG/ACT inhaler 2 puff (2 puffs Inhalation Given 05/25/21 0402)    ED Course  I have reviewed the triage vital signs and the nursing notes.  Pertinent labs & imaging results  that were available during my care of the patient were reviewed by me and considered in my medical decision making (see chart for details).    MDM Rules/Calculators/A&P                         Hypoxia with covid. Will need admitted. Will check labs.  Labs ok.   Final Clinical Impression(s) / ED Diagnoses Final diagnoses:  Hypoxia  COVID    Rx / DC Orders ED Discharge Orders     None        Parth Mccormac, Corene Cornea, MD 05/25/21 [phone]

## 2021-05-25 NOTE — ED Notes (Signed)
Date and time results received: 05/25/21 0451   Test: COVID Critical Value: POSITIVE  Name of Provider Notified: [place], MD

## 2021-05-25 NOTE — H&P (Signed)
History and Physical    Robert Brown:[id number] DOB: 1951/06/30 DOA: 05/25/2021  PCP: Chester Holstein, MD  Patient coming from: home  I have personally briefly reviewed patient's old medical records in [place]  Chief Complaint: shortness of breath  HPI: Robert Brown is a 70 y.o. male with medical history significant of diabetes, hypertension, coronary artery disease, hypothyroidism, prostate cancer, COPD, prior tobacco abuse but stopped smoking more than 12 years ago.  Patient reports that for the past 2 to 3 days he has had a mildly productive cough.  Yesterday he became increasingly short of breath.  When his shortness of breath worsened, he came to the ER for evaluation.  He was unaware of having any fevers at home, but felt that he may have had a fever on arrival to the ER.  He reports that last week he did have some nausea and vomiting.  He has not had any chest pain.  His wife also has similar symptoms.  He reports that he has been vaccinated for COVID-19.  He received to initial vaccine doses and also received 2 booster doses, the most recent dose being approximately 4 months ago.  He received Pfizer vaccine.  ED Course: On arrival to the emergency room, he was noted to be short of breath and wheezing.  Chest x-ray did not indicate any evidence of pneumonia.  COVID-19 is positive.  Oxygen saturations noted to be 87 to 91% on room air.  He was placed on 2 L of oxygen with improvement of oxygen saturation to 95 to 97%. He was hypertensive on arrival and tachycardic.  He has been referred for admission.  Review of Systems: Review of Systems  Constitutional:  Positive for chills and malaise/fatigue. Negative for fever.  HENT:  Negative for congestion and sore throat.   Eyes:  Negative for blurred vision and double vision.  Respiratory:  Positive for cough, sputum production, shortness of breath and wheezing.   Cardiovascular:  Positive for leg swelling. Negative for chest pain.   Gastrointestinal:  Positive for nausea and vomiting. Negative for abdominal pain and diarrhea.  Genitourinary:  Positive for frequency. Negative for dysuria.  Musculoskeletal:  Positive for myalgias.  Neurological:  Negative for dizziness, focal weakness, loss of consciousness and headaches.     Past Medical History:  Diagnosis Date   CAD (coronary artery disease)    Cancer ([place])    Diabetes mellitus without complication ([place])    Hypertension    Stroke [facility])     Past Surgical History:  Procedure Laterality Date   bone grafts     TUMOR EXCISION      Social History:  reports that he has never smoked. He has never used smokeless tobacco. He reports that he does not drink alcohol and does not use drugs.  Allergies  Allergen Reactions   Lisinopril     Medicines ending in -pril   Statins     Family History  Problem Relation Age of Onset   Diabetes Mother    Diabetes Father    Family history: Family history reviewed and not pertinent  Prior to Admission medications   Medication Sig Start Date End Date Taking? Authorizing Provider  aspirin 81 MG EC tablet Take by mouth. 04/18/21 04/18/22 Yes [provider]  amLODipine (NORVASC) 5 MG tablet Take 5 mg by mouth 2 (two) times daily. 02/21/21   [provider]  carvedilol (COREG) 25 MG tablet Take 25 mg by mouth 2 (two) times  daily. 02/21/21   [provider]  ezetimibe (ZETIA) 10 MG tablet Take 10 mg by mouth daily. 04/03/21   [provider]  finasteride (PROSCAR) 5 MG tablet  02/09/20   [provider]  glipiZIDE (GLUCOTROL XL) 5 MG 24 hr tablet Take 5 mg by mouth daily. 02/21/21   [provider]  hydrochlorothiazide (MICROZIDE) 12.5 MG capsule Take 12.5 mg by mouth daily. 11/29/20   [provider]  isosorbide mononitrate (IMDUR) 60 MG 24 hr tablet Take 60 mg by mouth daily. 02/27/21   [provider]  levothyroxine (SYNTHROID) 100 MCG tablet Take 100 mcg by mouth  daily. 04/18/21   [provider]  losartan (COZAAR) 100 MG tablet Take by mouth. 01/12/20   [provider]  metFORMIN (GLUCOPHAGE-XR) 500 MG 24 hr tablet TAKE 2 TABLETS BY MOUTH WITH BREAKFAST 01/12/20   [provider]  nabumetone (RELAFEN) 750 MG tablet Take 750 mg by mouth 2 (two) times daily. 02/21/21   [provider]  predniSONE (DELTASONE) 10 MG tablet Take by mouth. 01/26/21   [provider]  rosuvastatin (CRESTOR) 5 MG tablet Take 5 mg by mouth daily. 04/26/21   [provider]  tamsulosin (FLOMAX) 0.4 MG CAPS capsule Take 0.4 mg by mouth 2 (two) times daily. 12/23/19   [provider]    Physical Exam: Vitals:   05/25/21 0412 05/25/21 0415 05/25/21 0630 05/25/21 0700  BP:   107/61   Pulse:  96 82 78  Resp:  (!) 24 19 15   Temp:      TempSrc:      SpO2:  95% 96% 96%  Weight: 104 kg     Height: 5\' 10"  (1.778 m)       Constitutional: NAD, calm, comfortable Eyes: PERRL, lids and conjunctivae normal ENMT: Mucous membranes are moist. Posterior pharynx clear of any exudate or lesions.Normal dentition. Nose with prior surgical scar and discoloration of skin Neck: normal, supple, no masses, no thyromegaly Respiratory: bilateral wheezes. Normal respiratory effort. No accessory muscle use.  Cardiovascular: Regular rate and rhythm, no murmurs / rubs / gallops. 1+ extremity edema. 2+ pedal pulses. No carotid bruits.  Abdomen: no tenderness, no masses palpated. No hepatosplenomegaly. Bowel sounds positive.  Musculoskeletal: no clubbing / cyanosis. No joint deformity upper and lower extremities. Good ROM, no contractures. Normal muscle tone.  Skin: no rashes, lesions, ulcers. No induration Neurologic: CN 2-12 grossly intact. Sensation intact, DTR normal. Strength 5/5 in all 4.  Psychiatric: Normal judgment and insight. Alert and oriented x 3. Normal mood.    Labs on Admission: I have personally reviewed following labs and imaging  studies  CBC: Recent Labs  Lab 05/25/21 0335  WBC 13.6*  NEUTROABS 11.3*  HGB 14.1  HCT 43.2  MCV 88.5  PLT PLATELET CLUMPS NOTED ON SMEAR, COUNT APPEARS ADEQUATE   Basic Metabolic Panel: Recent Labs  Lab 05/25/21 0335  NA 139  K 3.2*  CL 101  CO2 27  GLUCOSE 105*  BUN 18  CREATININE 1.00  CALCIUM 8.7*   GFR: Estimated Creatinine Clearance: 83 mL/min (by C-G formula based on SCr of 1 mg/dL). Liver Function Tests: Recent Labs  Lab 05/25/21 0335  AST 22  ALT 21  ALKPHOS 83  BILITOT 0.7  PROT 7.3  ALBUMIN 3.9   No results for input(s): LIPASE, AMYLASE in the last 168 hours. No results for input(s): AMMONIA in the last 168 hours. Coagulation Profile: No results for input(s): INR, PROTIME in the  last 168 hours. Cardiac Enzymes: No results for input(s): CKTOTAL, CKMB, CKMBINDEX, TROPONINI in the last 168 hours. BNP (last 3 results) No results for input(s): PROBNP in the last 8760 hours. HbA1C: No results for input(s): HGBA1C in the last 72 hours. CBG: No results for input(s): GLUCAP in the last 168 hours. Lipid Profile: No results for input(s): CHOL, HDL, LDLCALC, TRIG, CHOLHDL, LDLDIRECT in the last 72 hours. Thyroid Function Tests: No results for input(s): TSH, T4TOTAL, FREET4, T3FREE, THYROIDAB in the last 72 hours. Anemia Panel: No results for input(s): VITAMINB12, FOLATE, FERRITIN, TIBC, IRON, RETICCTPCT in the last 72 hours. Urine analysis:    Component Value Date/Time   COLORURINE YELLOW 05/25/2021 0336   APPEARANCEUR CLEAR 05/25/2021 0336   LABSPEC 1.013 05/25/2021 0336   PHURINE 7.0 05/25/2021 0336   GLUCOSEU NEGATIVE 05/25/2021 0336   HGBUR SMALL (A) 05/25/2021 0336   BILIRUBINUR NEGATIVE 05/25/2021 0336   KETONESUR NEGATIVE 05/25/2021 0336   PROTEINUR NEGATIVE 05/25/2021 0336   NITRITE NEGATIVE 05/25/2021 0336   LEUKOCYTESUR SMALL (A) 05/25/2021 0336    Radiological Exams on Admission: DG Chest 2 View  Result Date: 05/25/2021 CLINICAL  DATA:  Shortness of breath, nausea. History of COPD and CHF. EXAM: CHEST - 2 VIEW COMPARISON:  Chest x-ray dated 06/21/2020. FINDINGS: Heart size and mediastinal contours are stable. Lungs are clear. No pleural effusion or pneumothorax is seen. Osseous structures about the chest are unremarkable. IMPRESSION: No active cardiopulmonary disease. No evidence of pneumonia or pulmonary edema. Electronically Signed   By: Franki Cabot M.D.   On: 05/25/2021 04:56    EKG: Independently reviewed. Sinus tachycardia  Assessment/Plan Active Problems:   Acute respiratory failure with hypoxia ([facility])   COVID-19 virus infection   HTN (hypertension)   HLD (hyperlipidemia)   DM (diabetes mellitus), type 2 ([facility])   CAD (coronary artery disease)   Hypothyroidism   Prostate cancer ([facility])   COPD (chronic obstructive pulmonary disease) ([facility])     Acute respiratory failure with hypoxia secondary to COVID-19 infection -Currently on 2 L of oxygen, wean down as tolerated -Started on remdesivir -Started on intravenous steroids -Continue supportive measures with antitussives, pulmonary hygiene, bronchodilators  COPD -He does have wheezing at this time -Reports that he normally uses albuterol as needed, he is not on any chronic steroid inhalers -We will continue on steroids and bronchodilators  Diabetes -Non-insulin-dependent, will hold oral agents for now -Start on sliding scale insulin, monitor blood sugars  Hypertension -Continue on amlodipine, Coreg -Hold losartan and hydrochlorothiazide for now  Hypothyroidism -Continue Synthroid  Hyperlipidemia -Continue statin  Prostate cancer -Chronically on tamsulosin and finasteride -Follow-up urology  Coronary artery disease -No complaints of chest pain at this time -Continue on Imdur, Coreg, statin, aspirin  DVT prophylaxis: lovenox  Code Status: partial code, no cpr/defibrillation  Family Communication: discussed with patient  Disposition Plan:  discharge home once resp status has improved  Consults called:   Admission status: telemetry, observation   Kathie Dike MD [facility]   If 7PM-7AM, please contact night-coverage [email or web address]   05/25/2021, 9:12 AM

## 2021-05-25 NOTE — ED Notes (Signed)
ED Provider at bedside. 

## 2021-05-25 NOTE — ED Notes (Signed)
Patient transported to X-ray 

## 2021-05-26 DIAGNOSIS — J9601 Acute respiratory failure with hypoxia: Secondary | ICD-10-CM | POA: Diagnosis not present

## 2021-05-26 DIAGNOSIS — U071 COVID-19: Secondary | ICD-10-CM | POA: Diagnosis not present

## 2021-05-26 DIAGNOSIS — J449 Chronic obstructive pulmonary disease, unspecified: Secondary | ICD-10-CM | POA: Diagnosis not present

## 2021-05-26 DIAGNOSIS — E1159 Type 2 diabetes mellitus with other circulatory complications: Secondary | ICD-10-CM | POA: Diagnosis not present

## 2021-05-26 LAB — CBC WITH DIFFERENTIAL/PLATELET
Abs Immature Granulocytes: 0.07 10*3/uL (ref 0.00–0.07)
Basophils Absolute: 0 10*3/uL (ref 0.0–0.1)
Basophils Relative: 0 %
Eosinophils Absolute: 0 10*3/uL (ref 0.0–0.5)
Eosinophils Relative: 0 %
HCT: 41.6 % (ref 39.0–52.0)
Hemoglobin: 13.1 g/dL (ref 13.0–17.0)
Immature Granulocytes: 1 %
Lymphocytes Relative: 8 %
Lymphs Abs: 0.7 10*3/uL (ref 0.7–4.0)
MCH: 28.4 pg (ref 26.0–34.0)
MCHC: 31.5 g/dL (ref 30.0–36.0)
MCV: 90.2 fL (ref 80.0–100.0)
Monocytes Absolute: 0.4 10*3/uL (ref 0.1–1.0)
Monocytes Relative: 5 %
Neutro Abs: 7.7 10*3/uL (ref 1.7–7.7)
Neutrophils Relative %: 86 %
Platelets: 140 10*3/uL — ABNORMAL LOW (ref 150–400)
RBC: 4.61 MIL/uL (ref 4.22–5.81)
RDW: 13.8 % (ref 11.5–15.5)
WBC: 8.9 10*3/uL (ref 4.0–10.5)
nRBC: 0 % (ref 0.0–0.2)

## 2021-05-26 LAB — COMPREHENSIVE METABOLIC PANEL
ALT: 20 U/L (ref 0–44)
AST: 17 U/L (ref 15–41)
Albumin: 3.3 g/dL — ABNORMAL LOW (ref 3.5–5.0)
Alkaline Phosphatase: 73 U/L (ref 38–126)
Anion gap: 7 (ref 5–15)
BUN: 25 mg/dL — ABNORMAL HIGH (ref 8–23)
CO2: 30 mmol/L (ref 22–32)
Calcium: 8.2 mg/dL — ABNORMAL LOW (ref 8.9–10.3)
Chloride: 101 mmol/L (ref 98–111)
Creatinine, Ser: 1.01 mg/dL (ref 0.61–1.24)
GFR, Estimated: >60 mL/min (ref >60)
Glucose, Bld: 187 mg/dL — ABNORMAL HIGH (ref 70–99)
Potassium: 3.1 mmol/L — ABNORMAL LOW (ref 3.5–5.1)
Sodium: 138 mmol/L (ref 135–145)
Total Bilirubin: 0.4 mg/dL (ref 0.3–1.2)
Total Protein: 6.6 g/dL (ref 6.5–8.1)

## 2021-05-26 LAB — FERRITIN: Ferritin: 159 ng/mL (ref 24–336)

## 2021-05-26 LAB — GLUCOSE, CAPILLARY
Glucose-Capillary: 203 mg/dL — ABNORMAL HIGH (ref 70–99)
Glucose-Capillary: 296 mg/dL — ABNORMAL HIGH (ref 70–99)
Glucose-Capillary: 234 mg/dL — ABNORMAL HIGH (ref 70–99)
Glucose-Capillary: 225 mg/dL — ABNORMAL HIGH (ref 70–99)

## 2021-05-26 LAB — C-REACTIVE PROTEIN: CRP: 11.9 mg/dL — ABNORMAL HIGH (ref <1.0)

## 2021-05-26 LAB — D-DIMER, QUANTITATIVE: D-Dimer, Quant: 0.53 ug/mL-FEU — ABNORMAL HIGH (ref 0.00–0.50)

## 2021-05-26 MED ORDER — POTASSIUM CHLORIDE CRYS ER 20 MEQ PO TBCR
40.0000 meq | EXTENDED_RELEASE_TABLET | ORAL | Status: AC
  Administered 2021-05-26 (×2): 40 meq via ORAL
  Filled 2021-05-26 (×2): qty 2

## 2021-05-26 MED ORDER — INSULIN GLARGINE 100 UNIT/ML ~~LOC~~ SOLN
15.0000 [IU] | Freq: Every day | SUBCUTANEOUS | Status: DC
  Administered 2021-05-26 – 2021-05-27 (×2): 15 [IU] via SUBCUTANEOUS
  Filled 2021-05-26 (×3): qty 0.15

## 2021-05-26 NOTE — Progress Notes (Signed)
Report given to 300 dept nurse.  Taken to room via wheelchair.

## 2021-05-26 NOTE — Progress Notes (Signed)
SATURATION QUALIFICATIONS: (This note is used to comply with regulatory documentation for home oxygen)  Patient Saturations on Room Air at Rest = 93%  Patient Saturations on Room Air while Ambulating = 90%  Ambulated in room only due to being COVID patient.

## 2021-05-26 NOTE — Progress Notes (Signed)
PROGRESS NOTE    Robert Brown  VHQ:[id number] DOB: Jul 16, 1951 DOA: 05/25/2021 PCP: Chester Holstein, MD    Brief Narrative:  70 year old male with a history of coronary artery disease, hypertension, diabetes, COPD, admitted to the hospital with shortness of breath and productive cough.  Found to have acute respiratory failure with hypoxia secondary to COVID-19 infection.   Assessment & Plan:   Active Problems:   Acute respiratory failure with hypoxia (HCC)   COVID-19 virus infection   HTN (hypertension)   HLD (hyperlipidemia)   DM (diabetes mellitus), type 2 (HCC)   CAD (coronary artery disease)   Hypothyroidism   Prostate cancer (HCC)   COPD (chronic obstructive pulmonary disease) (HCC)   Acute respiratory failure with hypoxia secondary to COVID-19 infection -Initially required 2 L of oxygen, weaning off as tolerated -Continue on remdesivir -Continue on intravenous steroids -Continue supportive measures with antitussives, pulmonary hygiene, bronchodilators -Overall inflammatory markers have been trending up, CRP 2.1->11.9 -Encouraged to lay in prone position as much as possible -We will continue to monitor   COPD -Continues to have mild wheeze -Reports that he normally uses albuterol as needed, he is not on any chronic steroid inhalers -We will continue on steroids and bronchodilators   Diabetes -Non-insulin-dependent, will hold oral agents for now -Start on sliding scale insulin, monitor blood sugars -Blood sugars consistently elevated, will start Lantus   Hypertension -Continue on amlodipine, Coreg -Hold losartan and hydrochlorothiazide for now   Hypothyroidism -Continue Synthroid   Hyperlipidemia -Continue statin  Hypokalemia -Replace   Prostate cancer -Chronically on tamsulosin and finasteride -Follow-up urology   Coronary artery disease -No complaints of chest pain at this time -Continue on Imdur, Coreg, statin, aspirin   DVT prophylaxis:  enoxaparin (LOVENOX) injection 40 mg Start: 05/25/21 1200  Code Status: Partial code, no CPR or defibrillation Family Communication: Discussed with patient Disposition Plan: Status is: Observation  The patient remains OBS appropriate and will d/c before 2 midnights.  Dispo: The patient is from: Home              Anticipated d/c is to: Home              Patient currently is not medically stable to d/c.   Difficult to place patient No         Consultants:    Procedures:    Antimicrobials:      Subjective: He feels that his breathing may be mildly better, but has been having frequent coughing spells where he feels he loses his breath.  Objective: Vitals:   05/26/21 0803 05/26/21 0819 05/26/21 0930 05/26/21 1208  BP: (!) 145/72  (!) 168/55   Pulse: 87 93 89 81  Resp: (!) 23 (!) 26 (!) 23 16  Temp:    98.4 F (36.9 C)  TempSrc:    Oral  SpO2: 91% 93% 95% 92%  Weight:      Height:        Intake/Output Summary (Last 24 hours) at 05/26/2021 1210 Last data filed at 05/25/2021 1900 Gross per 24 hour  Intake 0 ml  Output 250 ml  Net -250 ml   Filed Weights   05/25/21 0412 05/25/21 1132  Weight: 104 kg 106.4 kg    Examination:  General exam: Appears calm and comfortable  Respiratory system: Clear to auscultation. Respiratory effort normal. Cardiovascular system: S1 & S2 heard, RRR. No JVD, murmurs, rubs, gallops or clicks. No pedal edema. Gastrointestinal system: Abdomen is nondistended, soft and nontender. No  organomegaly or masses felt. Normal bowel sounds heard. Central nervous system: Alert and oriented. No focal neurological deficits. Extremities: Symmetric 5 x 5 power. Skin: No rashes, lesions or ulcers Psychiatry: Judgement and insight appear normal. Mood & affect appropriate.     Data Reviewed: I have personally reviewed following labs and imaging studies  CBC: Recent Labs  Lab 05/25/21 0335 05/26/21 0204  WBC 13.6* 8.9  NEUTROABS 11.3* 7.7   HGB 14.1 13.1  HCT 43.2 41.6  MCV 88.5 90.2  PLT PLATELET CLUMPS NOTED ON SMEAR, COUNT APPEARS ADEQUATE 737*   Basic Metabolic Panel: Recent Labs  Lab 05/25/21 0335 05/26/21 0204  NA 139 138  K 3.2* 3.1*  CL 101 101  CO2 27 30  GLUCOSE 105* 187*  BUN 18 25*  CREATININE 1.00 1.01  CALCIUM 8.7* 8.2*   GFR: Estimated Creatinine Clearance: 83.2 mL/min (by C-G formula based on SCr of 1.01 mg/dL). Liver Function Tests: Recent Labs  Lab 05/25/21 0335 05/26/21 0204  AST 22 17  ALT 21 20  ALKPHOS 83 73  BILITOT 0.7 0.4  PROT 7.3 6.6  ALBUMIN 3.9 3.3*   No results for input(s): LIPASE, AMYLASE in the last 168 hours. No results for input(s): AMMONIA in the last 168 hours. Coagulation Profile: No results for input(s): INR, PROTIME in the last 168 hours. Cardiac Enzymes: No results for input(s): CKTOTAL, CKMB, CKMBINDEX, TROPONINI in the last 168 hours. BNP (last 3 results) No results for input(s): PROBNP in the last 8760 hours. HbA1C: Recent Labs    05/25/21 0539  HGBA1C 7.2*   CBG: Recent Labs  Lab 05/25/21 1239 05/25/21 1723 05/25/21 2058 05/26/21 0757 05/26/21 1158  GLUCAP 115* 292* 302* 203* 296*   Lipid Profile: No results for input(s): CHOL, HDL, LDLCALC, TRIG, CHOLHDL, LDLDIRECT in the last 72 hours. Thyroid Function Tests: No results for input(s): TSH, T4TOTAL, FREET4, T3FREE, THYROIDAB in the last 72 hours. Anemia Panel: Recent Labs    05/25/21 0729 05/26/21 0204  FERRITIN 131 159   Sepsis Labs: Recent Labs  Lab 05/25/21 0539 05/25/21 0546 05/25/21 0739  PROCALCITON  --  <0.10  --   LATICACIDVEN 1.4  --  1.5    Recent Results (from the past 240 hour(s))  Resp Panel by RT-PCR (Flu A&B, Covid) Nasopharyngeal Swab     Status: Abnormal   Collection Time: 05/25/21  3:36 AM   Specimen: Nasopharyngeal Swab; Nasopharyngeal(NP) swabs in vial transport medium  Result Value Ref Range Status   SARS Coronavirus 2 by RT PCR POSITIVE (A) NEGATIVE  Final    Comment: RESULT CALLED TO, READ BACK BY AND VERIFIED WITH: SAPPELT,J @ 0449 ON 05/25/21 BY JUW (NOTE) SARS-CoV-2 target nucleic acids are DETECTED.  The SARS-CoV-2 RNA is generally detectable in upper respiratory specimens during the acute phase of infection. Positive results are indicative of the presence of the identified virus, but do not rule out bacterial infection or co-infection with other pathogens not detected by the test. Clinical correlation with patient history and other diagnostic information is necessary to determine patient infection status. The expected result is Negative.  Fact Sheet for Patients: EntrepreneurPulse.com.au  Fact Sheet for Healthcare Providers: IncredibleEmployment.be  This test is not yet approved or cleared by the [place] FDA and  has been authorized for detection and/or diagnosis of SARS-CoV-2 by FDA under an Emergency Use Authorization (EUA).  This EUA will remain in effect (meaning this test can be  used) for the duration of  the COVID-19 declaration  under Section 564(b)(1) of the Act, 21 U.S.C. section 360bbb-3(b)(1), unless the authorization is terminated or revoked sooner.     Influenza A by PCR NEGATIVE NEGATIVE Final   Influenza B by PCR NEGATIVE NEGATIVE Final    Comment: (NOTE) The Xpert Xpress SARS-CoV-2/FLU/RSV plus assay is intended as an aid in the diagnosis of influenza from Nasopharyngeal swab specimens and should not be used as a sole basis for treatment. Nasal washings and aspirates are unacceptable for Xpert Xpress SARS-CoV-2/FLU/RSV testing.  Fact Sheet for Patients: EntrepreneurPulse.com.au  Fact Sheet for Healthcare Providers: IncredibleEmployment.be  This test is not yet approved or cleared by the [place] FDA and has been authorized for detection and/or diagnosis of SARS-CoV-2 by FDA under an Emergency Use Authorization (EUA).  This EUA will remain in effect (meaning this test can be used) for the duration of the COVID-19 declaration under Section 564(b)(1) of the Act, 21 U.S.C. section 360bbb-3(b)(1), unless the authorization is terminated or revoked.  Performed at [facility], [address] [place], [place] [address]   MRSA Next Gen by PCR, Nasal     Status: None   Collection Time: 05/25/21 12:00 PM   Specimen: Nasal Mucosa; Nasal Swab  Result Value Ref Range Status   MRSA by PCR Next Gen NOT DETECTED NOT DETECTED Final    Comment: (NOTE) The GeneXpert MRSA Assay (FDA approved for NASAL specimens only), is one component of a comprehensive MRSA colonization surveillance program. It is not intended to diagnose MRSA infection nor to guide or monitor treatment for MRSA infections. Test performance is not FDA approved in patients less than 40 years old. Performed at [facility], [address], [place], [place] [address]          Radiology Studies: DG Chest 2 View  Result Date: 05/25/2021 CLINICAL DATA:  Shortness of breath, nausea. History of COPD and CHF. EXAM: CHEST - 2 VIEW COMPARISON:  Chest x-ray dated 06/21/2020. FINDINGS: Heart size and mediastinal contours are stable. Lungs are clear. No pleural effusion or pneumothorax is seen. Osseous structures about the chest are unremarkable. IMPRESSION: No active cardiopulmonary disease. No evidence of pneumonia or pulmonary edema. Electronically Signed   By: Franki Cabot M.D.   On: 05/25/2021 04:56        Scheduled Meds:  amLODipine  5 mg Oral BID   aspirin EC  81 mg Oral Daily   carvedilol  25 mg Oral BID WC   Chlorhexidine Gluconate Cloth  6 each Topical Q0600   enoxaparin (LOVENOX) injection  40 mg Subcutaneous Q24H   ezetimibe  10 mg Oral Daily   finasteride  5 mg Oral Daily   insulin aspart  0-15 Units Subcutaneous TID WC   insulin aspart  0-5 Units Subcutaneous QHS   isosorbide mononitrate  60 mg Oral Daily   levothyroxine  100 mcg Oral  Daily   methylPREDNISolone (SOLU-MEDROL) injection  0.5 mg/kg Intravenous Q12H   Followed by   Derrill Memo ON 05/28/2021] predniSONE  50 mg Oral Daily   rosuvastatin  5 mg Oral Daily   tamsulosin  0.4 mg Oral BID   Continuous Infusions:  remdesivir 100 mg in NS 100 mL 100 mg (05/26/21 0933)     LOS: 0 days    Time spent: 43mins    Kathie Dike, MD [facility]   If 7PM-7AM, please contact night-coverage [email or web address]  05/26/2021, 12:10 PM

## 2021-05-26 NOTE — Care Management Obs Status (Signed)
[place] NOTIFICATION   Patient Details  Name: Danon Lograsso MRN: [id number] Date of Birth: 12-02-1950   Medicare Observation Status Notification Given:  Yes    Natasha Bence, LCSW 05/26/2021, 4:32 PM

## 2021-05-27 DIAGNOSIS — J9601 Acute respiratory failure with hypoxia: Secondary | ICD-10-CM | POA: Diagnosis not present

## 2021-05-27 DIAGNOSIS — J449 Chronic obstructive pulmonary disease, unspecified: Secondary | ICD-10-CM | POA: Diagnosis not present

## 2021-05-27 DIAGNOSIS — U071 COVID-19: Secondary | ICD-10-CM | POA: Diagnosis not present

## 2021-05-27 DIAGNOSIS — E1159 Type 2 diabetes mellitus with other circulatory complications: Secondary | ICD-10-CM | POA: Diagnosis not present

## 2021-05-27 LAB — COMPREHENSIVE METABOLIC PANEL
ALT: 25 U/L (ref 0–44)
AST: 21 U/L (ref 15–41)
Albumin: 3.1 g/dL — ABNORMAL LOW (ref 3.5–5.0)
Alkaline Phosphatase: 64 U/L (ref 38–126)
Anion gap: 8 (ref 5–15)
BUN: 35 mg/dL — ABNORMAL HIGH (ref 8–23)
CO2: 30 mmol/L (ref 22–32)
Calcium: 8.6 mg/dL — ABNORMAL LOW (ref 8.9–10.3)
Chloride: 103 mmol/L (ref 98–111)
Creatinine, Ser: 1.16 mg/dL (ref 0.61–1.24)
GFR, Estimated: >60 mL/min (ref >60)
Glucose, Bld: 205 mg/dL — ABNORMAL HIGH (ref 70–99)
Potassium: 4.3 mmol/L (ref 3.5–5.1)
Sodium: 141 mmol/L (ref 135–145)
Total Bilirubin: 0.3 mg/dL (ref 0.3–1.2)
Total Protein: 6.3 g/dL — ABNORMAL LOW (ref 6.5–8.1)

## 2021-05-27 LAB — CBC WITH DIFFERENTIAL/PLATELET
Abs Immature Granulocytes: 0.13 10*3/uL — ABNORMAL HIGH (ref 0.00–0.07)
Basophils Absolute: 0 10*3/uL (ref 0.0–0.1)
Basophils Relative: 0 %
Eosinophils Absolute: 0 10*3/uL (ref 0.0–0.5)
Eosinophils Relative: 0 %
HCT: 42.3 % (ref 39.0–52.0)
Hemoglobin: 13.2 g/dL (ref 13.0–17.0)
Immature Granulocytes: 1 %
Lymphocytes Relative: 7 %
Lymphs Abs: 1.1 10*3/uL (ref 0.7–4.0)
MCH: 28.6 pg (ref 26.0–34.0)
MCHC: 31.2 g/dL (ref 30.0–36.0)
MCV: 91.6 fL (ref 80.0–100.0)
Monocytes Absolute: 0.8 10*3/uL (ref 0.1–1.0)
Monocytes Relative: 5 %
Neutro Abs: 13.7 10*3/uL — ABNORMAL HIGH (ref 1.7–7.7)
Neutrophils Relative %: 87 %
Platelets: 147 10*3/uL — ABNORMAL LOW (ref 150–400)
RBC: 4.62 MIL/uL (ref 4.22–5.81)
RDW: 14.2 % (ref 11.5–15.5)
WBC: 15.7 10*3/uL — ABNORMAL HIGH (ref 4.0–10.5)
nRBC: 0 % (ref 0.0–0.2)

## 2021-05-27 LAB — GLUCOSE, CAPILLARY
Glucose-Capillary: 232 mg/dL — ABNORMAL HIGH (ref 70–99)
Glucose-Capillary: 228 mg/dL — ABNORMAL HIGH (ref 70–99)
Glucose-Capillary: 219 mg/dL — ABNORMAL HIGH (ref 70–99)

## 2021-05-27 LAB — C-REACTIVE PROTEIN: CRP: 4.5 mg/dL — ABNORMAL HIGH (ref <1.0)

## 2021-05-27 LAB — FERRITIN: Ferritin: 238 ng/mL (ref 24–336)

## 2021-05-27 LAB — MAGNESIUM: Magnesium: 2.4 mg/dL (ref 1.7–2.4)

## 2021-05-27 LAB — D-DIMER, QUANTITATIVE: D-Dimer, Quant: 0.35 ug/mL-FEU (ref 0.00–0.50)

## 2021-05-27 MED ORDER — HYDROXYZINE HCL 25 MG PO TABS: 25.0000 mg | ORAL_TABLET | Freq: Three times a day (TID) | ORAL | Status: DC | PRN

## 2021-05-27 MED ORDER — GUAIFENESIN-DM 100-10 MG/5ML PO SYRP: 10.0000 mL | ORAL_SOLUTION | Freq: Four times a day (QID) | ORAL | 0 refills | Status: DC

## 2021-05-27 MED ORDER — HYDROCOD POLST-CPM POLST ER 10-8 MG/5ML PO SUER: 5.0000 mL | Freq: Two times a day (BID) | ORAL | 0 refills | Status: DC | PRN

## 2021-05-27 MED ORDER — PREDNISONE 50 MG PO TABS: 50.0000 mg | ORAL_TABLET | Freq: Every day | ORAL | 0 refills | Status: DC

## 2021-05-27 NOTE — Clinical Social Work Note (Signed)
Listing of providers accepting new patients placed in patient's discharge packet.    Chandria Rookstool, Clydene Pugh, LCSW

## 2021-05-27 NOTE — Discharge Summary (Signed)
Physician Discharge Summary  Robert Brown HFW:[id number] DOB: February 18, 1951 DOA: 05/25/2021  PCP: Chester Holstein, MD  Admit date: 05/25/2021 Discharge date: 05/27/2021  Admitted From: home Disposition:  home  Recommendations for Outpatient Follow-up:  Follow up with PCP in 1-2 weeks Please obtain BMP/CBC in one week  Discharge Condition:stable CODE STATUS:full code Diet recommendation: heart healthy, carb modified  Brief/Interim Summary: 70 year old male with a history of coronary artery disease, hypertension, diabetes, COPD, admitted to the hospital with shortness of breath and productive cough.  Found to have acute respiratory failure with hypoxia secondary to COVID-19 infection.  Discharge Diagnoses:  Active Problems:   Acute respiratory failure with hypoxia (HCC)   COVID-19 virus infection   HTN (hypertension)   HLD (hyperlipidemia)   DM (diabetes mellitus), type 2 (HCC)   CAD (coronary artery disease)   Hypothyroidism   Prostate cancer (HCC)   COPD (chronic obstructive pulmonary disease) ([place])  Acute respiratory failure with hypoxia secondary to COVID-19 infection -Initially required 2 L of oxygen. He has now weaned off to room air -Treated with remdesivir -Treated with steroids -Continue supportive measures with antitussives, pulmonary hygiene, bronchodilators -Overall inflammatory markers have been trending down, CRP 2.1->11.9->4.5 -Encouraged to lay in prone position as much as possible   COPD -Continues to have mild wheeze -Reports that he normally uses albuterol as needed, he is not on any chronic steroid inhalers -We will continue on steroids and bronchodilators   Diabetes -blood sugars elevated in the setting of steroids -expect blood sugars to normalized as steroids are tapered -resume oral agents on discharge   Hypertension -Continue on amlodipine, Coreg, losartan and hctz   Hypothyroidism -Continue Synthroid   Hyperlipidemia -Continue statin    Hypokalemia -Replace   Prostate cancer -Chronically on tamsulosin and finasteride -Follow-up urology   Coronary artery disease -No complaints of chest pain at this time -Continue on Imdur, Coreg, statin, aspirin  Discharge Instructions  Discharge Instructions     Diet - low sodium heart healthy   Complete by: As directed    Increase activity slowly   Complete by: As directed       Allergies as of 05/27/2021       Reactions   Lisinopril    Medicines ending in -pril   Statins         Medication List     TAKE these medications    albuterol 108 (90 Base) MCG/ACT inhaler Commonly known as: VENTOLIN HFA Inhale 1-2 puffs into the lungs every 6 (six) hours as needed for shortness of breath or wheezing.   amLODipine 5 MG tablet Commonly known as: NORVASC Take 5 mg by mouth 2 (two) times daily.   aspirin 81 MG EC tablet Take 81 mg by mouth daily.   carvedilol 25 MG tablet Commonly known as: COREG Take 25 mg by mouth 2 (two) times daily.   chlorpheniramine-HYDROcodone 10-8 MG/5ML Suer Commonly known as: Tussionex Pennkinetic ER Take 5 mLs by mouth every 12 (twelve) hours as needed for cough.   ezetimibe 10 MG tablet Commonly known as: ZETIA Take 10 mg by mouth daily.   finasteride 5 MG tablet Commonly known as: PROSCAR Take 5 mg by mouth daily.   glipiZIDE 5 MG 24 hr tablet Commonly known as: GLUCOTROL XL Take 5 mg by mouth daily.   guaiFENesin-dextromethorphan 100-10 MG/5ML syrup Commonly known as: ROBITUSSIN DM Take 10 mLs by mouth every 6 (six) hours.   hydrochlorothiazide 12.5 MG capsule Commonly known as: MICROZIDE Take 12.5 mg  by mouth daily.   isosorbide mononitrate 60 MG 24 hr tablet Commonly known as: IMDUR Take 60 mg by mouth daily.   levothyroxine 100 MCG tablet Commonly known as: SYNTHROID Take 100 mcg by mouth daily.   losartan 100 MG tablet Commonly known as: COZAAR Take 100 mg by mouth daily.   metFORMIN 500 MG 24 hr  tablet Commonly known as: GLUCOPHAGE-XR Take 1,000 mg by mouth daily with breakfast.   nabumetone 750 MG tablet Commonly known as: RELAFEN Take 750 mg by mouth 2 (two) times daily.   predniSONE 50 MG tablet Commonly known as: DELTASONE Take 1 tablet (50 mg total) by mouth daily. Start taking on: May 28, 2021   rosuvastatin 5 MG tablet Commonly known as: CRESTOR Take 5 mg by mouth daily.   tamsulosin 0.4 MG Caps capsule Commonly known as: FLOMAX Take 0.4 mg by mouth 2 (two) times daily.        Allergies  Allergen Reactions   Lisinopril     Medicines ending in -pril   Statins     Consultations:    Procedures/Studies: DG Chest 2 View  Result Date: 05/25/2021 CLINICAL DATA:  Shortness of breath, nausea. History of COPD and CHF. EXAM: CHEST - 2 VIEW COMPARISON:  Chest x-ray dated 06/21/2020. FINDINGS: Heart size and mediastinal contours are stable. Lungs are clear. No pleural effusion or pneumothorax is seen. Osseous structures about the chest are unremarkable. IMPRESSION: No active cardiopulmonary disease. No evidence of pneumonia or pulmonary edema. Electronically Signed   By: Franki Cabot M.D.   On: 05/25/2021 04:56      Subjective: Shortness of breath is improving. Continues to have cough which is minimally productive. Having some difficulty expectorating sputum  Discharge Exam: Vitals:   05/27/21 0415 05/27/21 0755 05/27/21 0805 05/27/21 1503  BP: (!) 148/75 138/75 138/75 (!) 174/89  Pulse: 79 72  85  Resp: 18 18  18   Temp: 98 F (36.7 C)   98.1 F (36.7 C)  TempSrc: Oral   Oral  SpO2: 93% 94%  95%  Weight:      Height:        General: Pt is alert, awake, not in acute distress Cardiovascular: RRR, S1/S2 +, no rubs, no gallops Respiratory: scattered rhonchi and wheezes bilaterally Abdominal: Soft, NT, ND, bowel sounds + Extremities: no edema, no cyanosis    The results of significant diagnostics from this hospitalization (including imaging,  microbiology, ancillary and laboratory) are listed below for reference.     Microbiology: Recent Results (from the past 240 hour(s))  Resp Panel by RT-PCR (Flu A&B, Covid) Nasopharyngeal Swab     Status: Abnormal   Collection Time: 05/25/21  3:36 AM   Specimen: Nasopharyngeal Swab; Nasopharyngeal(NP) swabs in vial transport medium  Result Value Ref Range Status   SARS Coronavirus 2 by RT PCR POSITIVE (A) NEGATIVE Final    Comment: RESULT CALLED TO, READ BACK BY AND VERIFIED WITH: SAPPELT,J @ 0449 ON 05/25/21 BY JUW (NOTE) SARS-CoV-2 target nucleic acids are DETECTED.  The SARS-CoV-2 RNA is generally detectable in upper respiratory specimens during the acute phase of infection. Positive results are indicative of the presence of the identified virus, but do not rule out bacterial infection or co-infection with other pathogens not detected by the test. Clinical correlation with patient history and other diagnostic information is necessary to determine patient infection status. The expected result is Negative.  Fact Sheet for Patients: EntrepreneurPulse.com.au  Fact Sheet for Healthcare Providers: IncredibleEmployment.be  This test  is not yet approved or cleared by the [place] and  has been authorized for detection and/or diagnosis of SARS-CoV-2 by FDA under an Emergency Use Authorization (EUA).  This EUA will remain in effect (meaning this test can be  used) for the duration of  the COVID-19 declaration under Section 564(b)(1) of the Act, 21 U.S.C. section 360bbb-3(b)(1), unless the authorization is terminated or revoked sooner.     Influenza A by PCR NEGATIVE NEGATIVE Final   Influenza B by PCR NEGATIVE NEGATIVE Final    Comment: (NOTE) The Xpert Xpress SARS-CoV-2/FLU/RSV plus assay is intended as an aid in the diagnosis of influenza from Nasopharyngeal swab specimens and should not be used as a sole basis for treatment. Nasal  washings and aspirates are unacceptable for Xpert Xpress SARS-CoV-2/FLU/RSV testing.  Fact Sheet for Patients: EntrepreneurPulse.com.au  Fact Sheet for Healthcare Providers: IncredibleEmployment.be  This test is not yet approved or cleared by the [place] FDA and has been authorized for detection and/or diagnosis of SARS-CoV-2 by FDA under an Emergency Use Authorization (EUA). This EUA will remain in effect (meaning this test can be used) for the duration of the COVID-19 declaration under Section 564(b)(1) of the Act, 21 U.S.C. section 360bbb-3(b)(1), unless the authorization is terminated or revoked.  Performed at [facility], [address], [place], [place] [address]   MRSA Next Gen by PCR, Nasal     Status: None   Collection Time: 05/25/21 12:00 PM   Specimen: Nasal Mucosa; Nasal Swab  Result Value Ref Range Status   MRSA by PCR Next Gen NOT DETECTED NOT DETECTED Final    Comment: (NOTE) The GeneXpert MRSA Assay (FDA approved for NASAL specimens only), is one component of a comprehensive MRSA colonization surveillance program. It is not intended to diagnose MRSA infection nor to guide or monitor treatment for MRSA infections. Test performance is not FDA approved in patients less than 36 years old. Performed at [facility], [address], [place], [place] [address]      Labs: BNP (last 3 results) Recent Labs    05/25/21 0539  BNP 85.2   Basic Metabolic Panel: Recent Labs  Lab 05/25/21 0335 05/26/21 0204 05/27/21 0515  NA 139 138 141  K 3.2* 3.1* 4.3  CL 101 101 103  CO2 27 30 30   GLUCOSE 105* 187* 205*  BUN 18 25* 35*  CREATININE 1.00 1.01 1.16  CALCIUM 8.7* 8.2* 8.6*  MG  --   --  2.4   Liver Function Tests: Recent Labs  Lab 05/25/21 0335 05/26/21 0204 05/27/21 0515  AST 22 17 21   ALT 21 20 25   ALKPHOS 83 73 64  BILITOT 0.7 0.4 0.3  PROT 7.3 6.6 6.3*  ALBUMIN 3.9 3.3* 3.1*   No results for input(s):  LIPASE, AMYLASE in the last 168 hours. No results for input(s): AMMONIA in the last 168 hours. CBC: Recent Labs  Lab 05/25/21 0335 05/26/21 0204 05/27/21 0515  WBC 13.6* 8.9 15.7*  NEUTROABS 11.3* 7.7 13.7*  HGB 14.1 13.1 13.2  HCT 43.2 41.6 42.3  MCV 88.5 90.2 91.6  PLT PLATELET CLUMPS NOTED ON SMEAR, COUNT APPEARS ADEQUATE 140* 147*   Cardiac Enzymes: No results for input(s): CKTOTAL, CKMB, CKMBINDEX, TROPONINI in the last 168 hours. BNP: Invalid input(s): POCBNP CBG: Recent Labs  Lab 05/26/21 1158 05/26/21 1635 05/26/21 2128 05/27/21 0744 05/27/21 1146  GLUCAP 296* 234* 225* 232* 228*   D-Dimer Recent Labs    05/26/21 0204 05/27/21 0515  DDIMER 0.53* 0.35   Hgb A1c Recent Labs    05/25/21 0539  HGBA1C 7.2*   Lipid Profile No results for input(s): CHOL, HDL, LDLCALC, TRIG, CHOLHDL, LDLDIRECT in the last 72 hours. Thyroid function studies No results for input(s): TSH, T4TOTAL, T3FREE, THYROIDAB in the last 72 hours.  Invalid input(s): FREET3 Anemia work up Recent Labs    05/26/21 0204 05/27/21 0700  FERRITIN 159 238   Urinalysis    Component Value Date/Time   COLORURINE YELLOW 05/25/2021 0336   APPEARANCEUR CLEAR 05/25/2021 0336   LABSPEC 1.013 05/25/2021 0336   PHURINE 7.0 05/25/2021 0336   GLUCOSEU NEGATIVE 05/25/2021 0336   HGBUR SMALL (A) 05/25/2021 0336   BILIRUBINUR NEGATIVE 05/25/2021 0336   KETONESUR NEGATIVE 05/25/2021 0336   PROTEINUR NEGATIVE 05/25/2021 0336   NITRITE NEGATIVE 05/25/2021 0336   LEUKOCYTESUR SMALL (A) 05/25/2021 0336   Sepsis Labs Invalid input(s): PROCALCITONIN,  WBC,  LACTICIDVEN Microbiology Recent Results (from the past 240 hour(s))  Resp Panel by RT-PCR (Flu A&B, Covid) Nasopharyngeal Swab     Status: Abnormal   Collection Time: 05/25/21  3:36 AM   Specimen: Nasopharyngeal Swab; Nasopharyngeal(NP) swabs in vial transport medium  Result Value Ref Range Status   SARS Coronavirus 2 by RT PCR POSITIVE (A)  NEGATIVE Final    Comment: RESULT CALLED TO, READ BACK BY AND VERIFIED WITH: SAPPELT,J @ 0449 ON 05/25/21 BY JUW (NOTE) SARS-CoV-2 target nucleic acids are DETECTED.  The SARS-CoV-2 RNA is generally detectable in upper respiratory specimens during the acute phase of infection. Positive results are indicative of the presence of the identified virus, but do not rule out bacterial infection or co-infection with other pathogens not detected by the test. Clinical correlation with patient history and other diagnostic information is necessary to determine patient infection status. The expected result is Negative.  Fact Sheet for Patients: EntrepreneurPulse.com.au  Fact Sheet for Healthcare Providers: IncredibleEmployment.be  This test is not yet approved or cleared by the [place] FDA and  has been authorized for detection and/or diagnosis of SARS-CoV-2 by FDA under an Emergency Use Authorization (EUA).  This EUA will remain in effect (meaning this test can be  used) for the duration of  the COVID-19 declaration under Section 564(b)(1) of the Act, 21 U.S.C. section 360bbb-3(b)(1), unless the authorization is terminated or revoked sooner.     Influenza A by PCR NEGATIVE NEGATIVE Final   Influenza B by PCR NEGATIVE NEGATIVE Final    Comment: (NOTE) The Xpert Xpress SARS-CoV-2/FLU/RSV plus assay is intended as an aid in the diagnosis of influenza from Nasopharyngeal swab specimens and should not be used as a sole basis for treatment. Nasal washings and aspirates are unacceptable for Xpert Xpress SARS-CoV-2/FLU/RSV testing.  Fact Sheet for Patients: EntrepreneurPulse.com.au  Fact Sheet for Healthcare Providers: IncredibleEmployment.be  This test is not yet approved or cleared by the [place] FDA and has been authorized for detection and/or diagnosis of SARS-CoV-2 by FDA under an Emergency Use  Authorization (EUA). This EUA will remain in effect (meaning this test can be used) for the duration of the COVID-19 declaration under Section 564(b)(1) of the Act, 21 U.S.C. section 360bbb-3(b)(1), unless the authorization is terminated or revoked.  Performed at [facility], [address] [place], [place] [address]   MRSA Next Gen by PCR, Nasal     Status: None   Collection Time: 05/25/21 12:00 PM   Specimen: Nasal Mucosa; Nasal Swab  Result Value Ref Range Status   MRSA by  PCR Next Gen NOT DETECTED NOT DETECTED Final    Comment: (NOTE) The GeneXpert MRSA Assay (FDA approved for NASAL specimens only), is one component of a comprehensive MRSA colonization surveillance program. It is not intended to diagnose MRSA infection nor to guide or monitor treatment for MRSA infections. Test performance is not FDA approved in patients less than 62 years old. Performed at [facility], [address], [place], [place] [address]      Time coordinating discharge: 49mins  SIGNED:   Kathie Dike, MD  [facility] 05/27/2021, 3:58 PM   If 7PM-7AM, please contact night-coverage [email or web address]

## 2021-05-27 NOTE — Progress Notes (Signed)
Inpatient Diabetes Program Recommendations  AACE/ADA: New Consensus Statement on Inpatient Glycemic Control   Target Ranges:  Prepandial:   less than 140 mg/dL      Peak postprandial:   less than 180 mg/dL (1-2 hours)      Critically ill patients:  140 - 180 mg/dL   Results for Robert Brown, Robert Brown (MRN [id number]) as of 05/27/2021 08:11  Ref. Range 05/26/2021 07:57 05/26/2021 11:58 05/26/2021 16:35 05/26/2021 21:28 05/27/2021 07:44  Glucose-Capillary Latest Ref Range: 70 - 99 mg/dL 203 (H) 296 (H) 234 (H) 225 (H) 232 (H)    Review of Glycemic Control  Diabetes history: DM2 Outpatient Diabetes medications: Glipizide XL 5 mg daily, Metformin XR 1000 mg QAM Current orders for Inpatient glycemic control: Lantus 15 units daily, Novolog 0-15 units TID with meals, Novolog 0-5 units QHS; Solumedrol 51.875 mg Q12H  Inpatient Diabetes Program Recommendations:    Insulin: If steroids are continued, please consider increasing Lantus to 18 units QHS and ordering Novolog 6 units TID with meals for meal coverage if patient eats at least 50% of meals.  Thanks, Barnie Alderman, RN, MSN, CDE Diabetes Coordinator Inpatient Diabetes Program [phone] (Team Pager from 8am to 5pm)

## 2021-05-27 NOTE — Discharge Instructions (Signed)
You will need to isolate at home until 06/15/21

## 2021-07-25 ENCOUNTER — Encounter (INDEPENDENT_AMBULATORY_CARE_PROVIDER_SITE_OTHER): Payer: Self-pay | Admitting: *Deleted

## 2021-08-26 ENCOUNTER — Ambulatory Visit: Payer: Medicare HMO | Admitting: Urology

## 2021-10-16 ENCOUNTER — Ambulatory Visit: Payer: Medicare HMO | Admitting: Urology

## 2021-10-22 ENCOUNTER — Ambulatory Visit: Payer: Medicare HMO | Admitting: Urology

## 2021-10-22 NOTE — Progress Notes (Deleted)
   Assessment: 1. Prostate cancer [facility])      Plan: ***  Chief Complaint: No chief complaint on file.   History of Present Illness:  Robert Brown is a 70 y.o. year old male who is seen in consultation from [place], [facility]  for evaluation of ***.   Past Medical History:  Past Medical History:  Diagnosis Date   CAD (coronary artery disease)    Cancer ([place])    Diabetes mellitus without complication ([place])    Hypertension    Stroke [facility])     Past Surgical History:  Past Surgical History:  Procedure Laterality Date   bone grafts     TUMOR EXCISION      Allergies:  Allergies  Allergen Reactions   Lisinopril     Medicines ending in -pril   Statins     Family History:  Family History  Problem Relation Age of Onset   Diabetes Mother    Diabetes Father     Social History:  Social History   Tobacco Use   Smoking status: Never   Smokeless tobacco: Never  Vaping Use   Vaping Use: Never used  Substance Use Topics   Alcohol use: Never   Drug use: Never    Review of symptoms:  Constitutional:  Negative for unexplained weight loss, night sweats, fever, chills ENT:  Negative for nose bleeds, sinus pain, painful swallowing CV:  Negative for chest pain, shortness of breath, exercise intolerance, palpitations, loss of consciousness Resp:  Negative for cough, wheezing, shortness of breath GI:  Negative for nausea, vomiting, diarrhea, bloody stools GU:  Positives noted in HPI; otherwise negative for gross hematuria, dysuria, urinary incontinence Neuro:  Negative for seizures, poor balance, limb weakness, slurred speech Psych:  Negative for lack of energy, depression, anxiety Endocrine:  Negative for polydipsia, polyuria, symptoms of hypoglycemia (dizziness, hunger, sweating) Hematologic:  Negative for anemia, purpura, petechia, prolonged or excessive bleeding, use of anticoagulants  Allergic:  Negative for difficulty breathing or choking as a  result of exposure to anything; no shellfish allergy; no allergic response (rash/itch) to materials, foods  Physical exam: There were no vitals taken for this visit. GENERAL APPEARANCE:  Well appearing, well developed, well nourished, NAD HEENT: Atraumatic, Normocephalic, oropharynx clear. NECK: Supple without lymphadenopathy or thyromegaly. LUNGS: Clear to auscultation bilaterally. HEART: Regular Rate and Rhythm without murmurs, gallops, or rubs. ABDOMEN: Soft, non-tender, No Masses. EXTREMITIES: Moves all extremities well.  Without clubbing, cyanosis, or edema. NEUROLOGIC:  Alert and oriented x 3, normal gait, CN II-XII grossly intact.  MENTAL STATUS:  Appropriate. BACK:  Non-tender to palpation.  No CVAT SKIN:  Warm, dry and intact.    Results: No results found for this or any previous visit (from the past 24 hour(s)).

## 2021-10-29 DIAGNOSIS — D123 Benign neoplasm of transverse colon: Secondary | ICD-10-CM | POA: Insufficient documentation

## 2021-12-20 ENCOUNTER — Encounter: Payer: Self-pay | Admitting: Emergency Medicine

## 2021-12-20 ENCOUNTER — Emergency Department (HOSPITAL_COMMUNITY): Payer: Medicare HMO

## 2021-12-20 ENCOUNTER — Ambulatory Visit
Admission: EM | Admit: 2021-12-20 | Discharge: 2021-12-20 | Disposition: A | Discharge status: Nursing Home (Includes ICF) | Payer: Medicare HMO

## 2021-12-20 ENCOUNTER — Other Ambulatory Visit: Payer: Self-pay

## 2021-12-20 ENCOUNTER — Inpatient Hospital Stay (HOSPITAL_COMMUNITY)
Admission: EM | Admit: 2021-12-20 | Discharge: 2021-12-23 | DRG: 193 | Disposition: A | Discharge status: Home / Self Care | Payer: Medicare HMO | Attending: Internal Medicine | Admitting: Internal Medicine

## 2021-12-20 ENCOUNTER — Encounter (HOSPITAL_COMMUNITY): Payer: Self-pay

## 2021-12-20 DIAGNOSIS — Z6834 Body mass index (BMI) 34.0-34.9, adult: Secondary | ICD-10-CM

## 2021-12-20 DIAGNOSIS — Z8616 Personal history of COVID-19: Secondary | ICD-10-CM

## 2021-12-20 DIAGNOSIS — Z8673 Personal history of transient ischemic attack (TIA), and cerebral infarction without residual deficits: Secondary | ICD-10-CM

## 2021-12-20 DIAGNOSIS — J9621 Acute and chronic respiratory failure with hypoxia: Secondary | ICD-10-CM | POA: Diagnosis present

## 2021-12-20 DIAGNOSIS — E039 Hypothyroidism, unspecified: Secondary | ICD-10-CM | POA: Diagnosis present

## 2021-12-20 DIAGNOSIS — I2583 Coronary atherosclerosis due to lipid rich plaque: Secondary | ICD-10-CM

## 2021-12-20 DIAGNOSIS — I2510 Atherosclerotic heart disease of native coronary artery without angina pectoris: Secondary | ICD-10-CM | POA: Diagnosis present

## 2021-12-20 DIAGNOSIS — E1165 Type 2 diabetes mellitus with hyperglycemia: Secondary | ICD-10-CM | POA: Diagnosis present

## 2021-12-20 DIAGNOSIS — Z79899 Other long term (current) drug therapy: Secondary | ICD-10-CM | POA: Diagnosis not present

## 2021-12-20 DIAGNOSIS — J441 Chronic obstructive pulmonary disease with (acute) exacerbation: Secondary | ICD-10-CM

## 2021-12-20 DIAGNOSIS — Z8546 Personal history of malignant neoplasm of prostate: Secondary | ICD-10-CM

## 2021-12-20 DIAGNOSIS — J189 Pneumonia, unspecified organism: Secondary | ICD-10-CM | POA: Diagnosis present

## 2021-12-20 DIAGNOSIS — J440 Chronic obstructive pulmonary disease with acute lower respiratory infection: Secondary | ICD-10-CM | POA: Diagnosis present

## 2021-12-20 DIAGNOSIS — E785 Hyperlipidemia, unspecified: Secondary | ICD-10-CM | POA: Diagnosis present

## 2021-12-20 DIAGNOSIS — N400 Enlarged prostate without lower urinary tract symptoms: Secondary | ICD-10-CM | POA: Diagnosis present

## 2021-12-20 DIAGNOSIS — I10 Essential (primary) hypertension: Secondary | ICD-10-CM | POA: Diagnosis present

## 2021-12-20 DIAGNOSIS — I160 Hypertensive urgency: Secondary | ICD-10-CM | POA: Diagnosis not present

## 2021-12-20 DIAGNOSIS — E1159 Type 2 diabetes mellitus with other circulatory complications: Secondary | ICD-10-CM | POA: Diagnosis not present

## 2021-12-20 DIAGNOSIS — C61 Malignant neoplasm of prostate: Secondary | ICD-10-CM | POA: Diagnosis present

## 2021-12-20 DIAGNOSIS — Z7984 Long term (current) use of oral hypoglycemic drugs: Secondary | ICD-10-CM | POA: Diagnosis not present

## 2021-12-20 DIAGNOSIS — Z7982 Long term (current) use of aspirin: Secondary | ICD-10-CM | POA: Diagnosis not present

## 2021-12-20 DIAGNOSIS — E669 Obesity, unspecified: Secondary | ICD-10-CM | POA: Diagnosis present

## 2021-12-20 DIAGNOSIS — J449 Chronic obstructive pulmonary disease, unspecified: Secondary | ICD-10-CM | POA: Diagnosis not present

## 2021-12-20 DIAGNOSIS — Z833 Family history of diabetes mellitus: Secondary | ICD-10-CM

## 2021-12-20 DIAGNOSIS — J9601 Acute respiratory failure with hypoxia: Secondary | ICD-10-CM | POA: Diagnosis not present

## 2021-12-20 DIAGNOSIS — Z7952 Long term (current) use of systemic steroids: Secondary | ICD-10-CM

## 2021-12-20 DIAGNOSIS — E119 Type 2 diabetes mellitus without complications: Secondary | ICD-10-CM

## 2021-12-20 LAB — TROPONIN I (HIGH SENSITIVITY)
Troponin I (High Sensitivity): 5 ng/L (ref <18)
Troponin I (High Sensitivity): 5 ng/L (ref <18)

## 2021-12-20 LAB — CBC WITH DIFFERENTIAL/PLATELET
Abs Immature Granulocytes: 0.03 10*3/uL (ref 0.00–0.07)
Basophils Absolute: 0 10*3/uL (ref 0.0–0.1)
Basophils Relative: 1 %
Eosinophils Absolute: 0.5 10*3/uL (ref 0.0–0.5)
Eosinophils Relative: 8 %
HCT: 44.1 % (ref 39.0–52.0)
Hemoglobin: 14.4 g/dL (ref 13.0–17.0)
Immature Granulocytes: 1 %
Lymphocytes Relative: 27 %
Lymphs Abs: 1.7 10*3/uL (ref 0.7–4.0)
MCH: 28.9 pg (ref 26.0–34.0)
MCHC: 32.7 g/dL (ref 30.0–36.0)
MCV: 88.4 fL (ref 80.0–100.0)
Monocytes Absolute: 0.8 10*3/uL (ref 0.1–1.0)
Monocytes Relative: 13 %
Neutro Abs: 3.2 10*3/uL (ref 1.7–7.7)
Neutrophils Relative %: 50 %
Platelets: 158 10*3/uL (ref 150–400)
RBC: 4.99 MIL/uL (ref 4.22–5.81)
RDW: 13.9 % (ref 11.5–15.5)
WBC: 6.4 10*3/uL (ref 4.0–10.5)
nRBC: 0 % (ref 0.0–0.2)

## 2021-12-20 LAB — RESP PANEL BY RT-PCR (FLU A&B, COVID) ARPGX2
Influenza A by PCR: NEGATIVE
Influenza B by PCR: NEGATIVE
SARS Coronavirus 2 by RT PCR: NEGATIVE

## 2021-12-20 LAB — HEMOGLOBIN A1C
Hgb A1c MFr Bld: 7 % — ABNORMAL HIGH (ref 4.8–5.6)
Mean Plasma Glucose: 154.2 mg/dL

## 2021-12-20 LAB — COMPREHENSIVE METABOLIC PANEL
ALT: 23 U/L (ref 0–44)
AST: 20 U/L (ref 15–41)
Albumin: 3.8 g/dL (ref 3.5–5.0)
Alkaline Phosphatase: 85 U/L (ref 38–126)
Anion gap: 10 (ref 5–15)
BUN: 24 mg/dL — ABNORMAL HIGH (ref 8–23)
CO2: 30 mmol/L (ref 22–32)
Calcium: 9 mg/dL (ref 8.9–10.3)
Chloride: 101 mmol/L (ref 98–111)
Creatinine, Ser: 1.16 mg/dL (ref 0.61–1.24)
GFR, Estimated: >60 mL/min (ref >60)
Glucose, Bld: 139 mg/dL — ABNORMAL HIGH (ref 70–99)
Potassium: 3.3 mmol/L — ABNORMAL LOW (ref 3.5–5.1)
Sodium: 141 mmol/L (ref 135–145)
Total Bilirubin: 0.6 mg/dL (ref 0.3–1.2)
Total Protein: 7.6 g/dL (ref 6.5–8.1)

## 2021-12-20 LAB — GLUCOSE, CAPILLARY: Glucose-Capillary: 384 mg/dL — ABNORMAL HIGH (ref 70–99)

## 2021-12-20 LAB — STREP PNEUMONIAE URINARY ANTIGEN: Strep Pneumo Urinary Antigen: NEGATIVE

## 2021-12-20 LAB — BRAIN NATRIURETIC PEPTIDE: B Natriuretic Peptide: 24 pg/mL (ref 0.0–100.0)

## 2021-12-20 MED ORDER — IPRATROPIUM-ALBUTEROL 0.5-2.5 (3) MG/3ML IN SOLN
3.0000 mL | Freq: Once | RESPIRATORY_TRACT | Status: AC
  Administered 2021-12-20: 3 mL via RESPIRATORY_TRACT
  Filled 2021-12-20: qty 3

## 2021-12-20 MED ORDER — SODIUM CHLORIDE 0.9 % IV SOLN
500.0000 mg | Freq: Once | INTRAVENOUS | Status: AC
  Administered 2021-12-20: 500 mg via INTRAVENOUS
  Filled 2021-12-20: qty 5

## 2021-12-20 MED ORDER — NABUMETONE 500 MG PO TABS
750.0000 mg | ORAL_TABLET | Freq: Two times a day (BID) | ORAL | Status: DC
  Administered 2021-12-21 – 2021-12-23 (×5): 750 mg via ORAL
  Filled 2021-12-20 (×10): qty 2

## 2021-12-20 MED ORDER — SODIUM CHLORIDE 0.9 % IV SOLN
500.0000 mg | INTRAVENOUS | Status: DC
  Administered 2021-12-21: 500 mg via INTRAVENOUS
  Filled 2021-12-20: qty 5

## 2021-12-20 MED ORDER — IOHEXOL 350 MG/ML SOLN
100.0000 mL | Freq: Once | INTRAVENOUS | Status: AC | PRN
  Administered 2021-12-20: 100 mL via INTRAVENOUS

## 2021-12-20 MED ORDER — LOSARTAN POTASSIUM 50 MG PO TABS
100.0000 mg | ORAL_TABLET | Freq: Every day | ORAL | Status: DC
  Administered 2021-12-20 – 2021-12-23 (×4): 100 mg via ORAL
  Filled 2021-12-20 (×2): qty 2
  Filled 2021-12-20: qty 4
  Filled 2021-12-20: qty 2

## 2021-12-20 MED ORDER — IPRATROPIUM-ALBUTEROL 0.5-2.5 (3) MG/3ML IN SOLN
3.0000 mL | Freq: Four times a day (QID) | RESPIRATORY_TRACT | Status: DC
  Administered 2021-12-20 – 2021-12-23 (×12): 3 mL via RESPIRATORY_TRACT
  Filled 2021-12-20 (×12): qty 3

## 2021-12-20 MED ORDER — INSULIN ASPART 100 UNIT/ML IJ SOLN
0.0000 [IU] | Freq: Every day | INTRAMUSCULAR | Status: DC
  Administered 2021-12-20: 5 [IU] via SUBCUTANEOUS
  Administered 2021-12-21: 3 [IU] via SUBCUTANEOUS
  Administered 2021-12-22: 2 [IU] via SUBCUTANEOUS

## 2021-12-20 MED ORDER — SODIUM CHLORIDE 0.9 % IV SOLN
2.0000 g | INTRAVENOUS | Status: DC
  Administered 2021-12-21: 2 g via INTRAVENOUS
  Filled 2021-12-20: qty 20

## 2021-12-20 MED ORDER — ALBUTEROL SULFATE (2.5 MG/3ML) 0.083% IN NEBU
2.5000 mg | INHALATION_SOLUTION | RESPIRATORY_TRACT | Status: DC | PRN
  Administered 2021-12-21 – 2021-12-23 (×2): 2.5 mg via RESPIRATORY_TRACT
  Filled 2021-12-20 (×2): qty 3

## 2021-12-20 MED ORDER — LEVOTHYROXINE SODIUM 100 MCG PO TABS
100.0000 ug | ORAL_TABLET | Freq: Every day | ORAL | Status: DC
  Administered 2021-12-21 – 2021-12-23 (×3): 100 ug via ORAL
  Filled 2021-12-20 (×3): qty 1

## 2021-12-20 MED ORDER — INSULIN ASPART 100 UNIT/ML IJ SOLN
0.0000 [IU] | Freq: Three times a day (TID) | INTRAMUSCULAR | Status: DC
  Administered 2021-12-21 (×3): 5 [IU] via SUBCUTANEOUS
  Administered 2021-12-22: 3 [IU] via SUBCUTANEOUS
  Administered 2021-12-22: 8 [IU] via SUBCUTANEOUS
  Administered 2021-12-22: 5 [IU] via SUBCUTANEOUS
  Administered 2021-12-23: 2 [IU] via SUBCUTANEOUS
  Administered 2021-12-23: 3 [IU] via SUBCUTANEOUS

## 2021-12-20 MED ORDER — METHYLPREDNISOLONE SODIUM SUCC 125 MG IJ SOLR
125.0000 mg | Freq: Every day | INTRAMUSCULAR | Status: AC
  Administered 2021-12-21: 125 mg via INTRAVENOUS
  Filled 2021-12-20: qty 2

## 2021-12-20 MED ORDER — ISOSORBIDE MONONITRATE ER 60 MG PO TB24
60.0000 mg | ORAL_TABLET | Freq: Every day | ORAL | Status: DC
  Administered 2021-12-20 – 2021-12-23 (×4): 60 mg via ORAL
  Filled 2021-12-20 (×4): qty 1

## 2021-12-20 MED ORDER — CARVEDILOL 12.5 MG PO TABS
25.0000 mg | ORAL_TABLET | Freq: Two times a day (BID) | ORAL | Status: DC
  Administered 2021-12-20 – 2021-12-23 (×6): 25 mg via ORAL
  Filled 2021-12-20 (×6): qty 2

## 2021-12-20 MED ORDER — SODIUM CHLORIDE 0.9 % IV SOLN
1.0000 g | Freq: Once | INTRAVENOUS | Status: AC
  Administered 2021-12-20: 1 g via INTRAVENOUS
  Filled 2021-12-20: qty 10

## 2021-12-20 MED ORDER — TAMSULOSIN HCL 0.4 MG PO CAPS
0.4000 mg | ORAL_CAPSULE | Freq: Two times a day (BID) | ORAL | Status: DC
Start: 2021-12-20 — End: 2021-12-23
  Administered 2021-12-20 – 2021-12-23 (×6): 0.4 mg via ORAL
  Filled 2021-12-20 (×6): qty 1

## 2021-12-20 MED ORDER — FINASTERIDE 5 MG PO TABS
5.0000 mg | ORAL_TABLET | Freq: Every day | ORAL | Status: DC
  Administered 2021-12-20 – 2021-12-23 (×4): 5 mg via ORAL
  Filled 2021-12-20 (×4): qty 1

## 2021-12-20 MED ORDER — ACETAMINOPHEN 325 MG PO TABS
650.0000 mg | ORAL_TABLET | Freq: Four times a day (QID) | ORAL | Status: DC | PRN
  Administered 2021-12-20: 650 mg via ORAL
  Filled 2021-12-20: qty 2

## 2021-12-20 MED ORDER — ACETAMINOPHEN 650 MG RE SUPP: 650.0000 mg | Freq: Four times a day (QID) | RECTAL | Status: DC | PRN

## 2021-12-20 MED ORDER — ROSUVASTATIN CALCIUM 10 MG PO TABS
5.0000 mg | ORAL_TABLET | Freq: Every day | ORAL | Status: DC
  Administered 2021-12-20 – 2021-12-23 (×4): 5 mg via ORAL
  Filled 2021-12-20 (×4): qty 1

## 2021-12-20 MED ORDER — PREDNISONE 20 MG PO TABS
40.0000 mg | ORAL_TABLET | Freq: Every day | ORAL | Status: DC
  Administered 2021-12-22: 40 mg via ORAL
  Filled 2021-12-20: qty 2

## 2021-12-20 MED ORDER — ENOXAPARIN SODIUM 60 MG/0.6ML IJ SOSY
50.0000 mg | PREFILLED_SYRINGE | INTRAMUSCULAR | Status: DC
  Administered 2021-12-20 – 2021-12-22 (×3): 50 mg via SUBCUTANEOUS
  Filled 2021-12-20 (×3): qty 0.6

## 2021-12-20 MED ORDER — LEVOTHYROXINE SODIUM 50 MCG PO TABS: 100.0000 ug | ORAL_TABLET | Freq: Every day | ORAL | Status: DC

## 2021-12-20 MED ORDER — METHYLPREDNISOLONE SODIUM SUCC 125 MG IJ SOLR
125.0000 mg | Freq: Once | INTRAMUSCULAR | Status: AC
  Administered 2021-12-20: 125 mg via INTRAVENOUS
  Filled 2021-12-20: qty 2

## 2021-12-20 MED ORDER — ASPIRIN EC 81 MG PO TBEC
81.0000 mg | DELAYED_RELEASE_TABLET | Freq: Every day | ORAL | Status: DC
  Administered 2021-12-20 – 2021-12-23 (×4): 81 mg via ORAL
  Filled 2021-12-20 (×4): qty 1

## 2021-12-20 MED ORDER — GUAIFENESIN ER 600 MG PO TB12
1200.0000 mg | ORAL_TABLET | Freq: Two times a day (BID) | ORAL | Status: DC
  Administered 2021-12-20 – 2021-12-23 (×6): 1200 mg via ORAL
  Filled 2021-12-20 (×6): qty 2

## 2021-12-20 NOTE — ED Notes (Addendum)
EMS at bedside. Report given to EMS and Nira Conn, Agricultural consultant at [facility] ED.

## 2021-12-20 NOTE — ED Notes (Signed)
Patient transported to CT 

## 2021-12-20 NOTE — ED Notes (Signed)
Patient is being discharged from the Urgent Care and sent to the Emergency Department via EMS . Per PA, patient is in need of higher level of care due to shortness of breath/chest pain. Patient is aware and verbalizes understanding of plan of care.  Vitals:   12/20/21 1335 12/20/21 1336  BP: (!) 191/112   Pulse: 87   Resp: (!) 26   Temp: 97.7 F (36.5 C)   SpO2: (!) 88% 90%

## 2021-12-20 NOTE — Plan of Care (Signed)

## 2021-12-20 NOTE — ED Provider Notes (Signed)
[facility] EMERGENCY DEPARTMENT Provider Note   CSN: [id number] Arrival date & time: 12/20/21  1407     History  Chief Complaint  Patient presents with   Shortness of Breath    Robert Brown is a 71 y.o. male.   Shortness of Breath  Patient reporting with progressively worsening chest tightness and shortness of breath x1 week.  States has been having shortness of breath and chest pain intermittently since "the summer 1952".  He does endorse is gotten worse in all of the last week.  Chest pain feels more like tightness, does not radiate elsewhere.  Associated with shortness of breath, denies any exertional component.  Does endorse the chest pain is worse with inhalation.  He is also having a productive cough and feeling fatigued.  Denies any fevers, body aches, nausea, vomiting, diarrhea.  He has been using his albuterol inhaler without any relief.  Patient does not use oxygen at home.  He was seen earlier today at urgent care and he was found to be hypoxic, was put on 4 L of oxygen and advised to go to the ED for additional work-up at that time.  PMH: hypertension, diabetes, hyperlipidemia, coronary artery disease, prostate cancer.  Home Medications Prior to Admission medications   Medication Sig Start Date End Date Taking? Authorizing Provider  albuterol (VENTOLIN HFA) 108 (90 Base) MCG/ACT inhaler Inhale 1-2 puffs into the lungs every 6 (six) hours as needed for shortness of breath or wheezing. 11/25/18   [provider]  amLODipine (NORVASC) 5 MG tablet Take 5 mg by mouth 2 (two) times daily. 02/21/21   [provider]  aspirin 81 MG EC tablet Take 81 mg by mouth daily. 04/18/21 04/18/22  [provider]  carvedilol (COREG) 25 MG tablet Take 25 mg by mouth 2 (two) times daily. 02/21/21   [provider]  chlorpheniramine-HYDROcodone (TUSSIONEX PENNKINETIC ER) 10-8 MG/5ML SUER Take 5 mLs by mouth every 12 (twelve) hours as needed for cough. 05/27/21   Kathie Dike, MD  ezetimibe (ZETIA) 10 MG tablet Take 10 mg by mouth daily. 04/03/21   [provider]  finasteride (PROSCAR) 5 MG tablet Take 5 mg by mouth daily. 02/09/20   [provider]  glipiZIDE (GLUCOTROL XL) 5 MG 24 hr tablet Take 5 mg by mouth daily. 02/21/21   [provider]  guaiFENesin-dextromethorphan (ROBITUSSIN DM) 100-10 MG/5ML syrup Take 10 mLs by mouth every 6 (six) hours. 05/27/21   Kathie Dike, MD  hydrochlorothiazide (MICROZIDE) 12.5 MG capsule Take 12.5 mg by mouth daily. 11/29/20   [provider]  isosorbide mononitrate (IMDUR) 60 MG 24 hr tablet Take 60 mg by mouth daily. 02/27/21   [provider]  levothyroxine (SYNTHROID) 100 MCG tablet Take 100 mcg by mouth daily. 04/18/21   [provider]  losartan (COZAAR) 100 MG tablet Take 100 mg by mouth daily. 01/12/20   [provider]  metFORMIN (GLUCOPHAGE-XR) 500 MG 24 hr tablet Take 1,000 mg by mouth daily with breakfast. 01/12/20   [provider]  nabumetone (RELAFEN) 750 MG tablet Take 750 mg by mouth 2 (two) times daily. 02/21/21   [provider]  predniSONE (DELTASONE) 50 MG tablet Take 1 tablet (50 mg total) by mouth daily. 05/28/21   Kathie Dike, MD  rosuvastatin (CRESTOR) 5 MG tablet Take 5 mg by mouth daily. 04/26/21   [provider]  tamsulosin (FLOMAX) 0.4 MG CAPS capsule Take 0.4 mg by mouth 2 (two) times daily. 12/23/19  [provider]      Allergies    Lisinopril and Statins    Review of Systems   Review of Systems  Respiratory:  Positive for shortness of breath.    Physical Exam Updated Vital Signs BP (!) 150/70    Pulse 83    Temp 97.9 F (36.6 C) (Oral)    Resp (!) 25    Ht 5\' 10"  (1.778 m)    Wt 108.9 kg    SpO2 93%    BMI 34.44 kg/m  Physical Exam Vitals and nursing note reviewed. Exam conducted with a chaperone present.  Constitutional:      Appearance: Normal appearance. He is obese. He is  ill-appearing.  HENT:     Head: Normocephalic and atraumatic.  Eyes:     General: No scleral icterus.       Right eye: No discharge.        Left eye: No discharge.     Extraocular Movements: Extraocular movements intact.     Pupils: Pupils are equal, round, and reactive to light.  Cardiovascular:     Rate and Rhythm: Regular rhythm. Tachycardia present.     Pulses: Normal pulses.     Heart sounds: Normal heart sounds. No murmur heard.   No friction rub. No gallop.  Pulmonary:     Effort: Tachypnea and accessory muscle usage present. No respiratory distress.     Breath sounds: Rhonchi and rales present. No wheezing.  Abdominal:     General: Abdomen is flat. Bowel sounds are normal. There is no distension.     Palpations: Abdomen is soft.     Tenderness: There is no abdominal tenderness.  Skin:    General: Skin is warm and dry.     Coloration: Skin is not jaundiced.  Neurological:     Mental Status: He is alert. Mental status is at baseline.     Coordination: Coordination normal.    ED Results / Procedures / Treatments   Labs (all labs ordered are listed, but only abnormal results are displayed) Labs Reviewed  COMPREHENSIVE METABOLIC PANEL - Abnormal; Notable for the following components:      Result Value   Potassium 3.3 (*)    Glucose, Bld 139 (*)    BUN 24 (*)    All other components within normal limits  RESP PANEL BY RT-PCR (FLU A&B, COVID) ARPGX2  CBC WITH DIFFERENTIAL/PLATELET  BRAIN NATRIURETIC PEPTIDE  TROPONIN I (HIGH SENSITIVITY)  TROPONIN I (HIGH SENSITIVITY)    EKG EKG Interpretation  Date/Time:  Friday December 20 2021 14:15:24 EST Ventricular Rate:  89 PR Interval:  179 QRS Duration: 91 QT Interval:  370 QTC Calculation: 451 R Axis:   46 Text Interpretation: Sinus rhythm Anteroseptal infarct, age indeterminate Confirmed by Fredia Sorrow [phone]) on 12/20/2021 2:22:32 PM  Radiology CT Angio Chest PE W/Cm &/Or Wo Cm  Result Date:  12/20/2021 CLINICAL DATA:  Cough and shortness of breath for the past week. EXAM: CT ANGIOGRAPHY CHEST WITH CONTRAST TECHNIQUE: Multidetector CT imaging of the chest was performed using the standard protocol during bolus administration of intravenous contrast. Multiplanar CT image reconstructions and MIPs were obtained to evaluate the vascular anatomy. RADIATION DOSE REDUCTION: This exam was performed according to the departmental dose-optimization program which includes automated exposure control, adjustment of the mA and/or kV according to patient size and/or use of iterative reconstruction technique. CONTRAST:  145mL OMNIPAQUE IOHEXOL 350 MG/ML SOLN COMPARISON:  Chest x-ray from same day. FINDINGS: Cardiovascular: Satisfactory opacification  of the pulmonary arteries to the segmental level. No evidence of pulmonary embolism. Mild cardiomegaly. Trace pericardial fluid. Reflux of contrast into the IVC and hepatic veins. No thoracic aortic aneurysm or dissection. Coronary, aortic arch, and branch vessel atherosclerotic vascular disease. Mediastinum/Nodes: Mildly enlarged mediastinal and bilateral hilar lymph nodes measuring up to 1.4 cm in short axis. The thyroid gland, trachea, and esophagus demonstrate no significant findings. Lungs/Pleura: Prominent central peribronchial thickening. Small peribronchovascular consolidation in the central right lower lobe with surrounding scattered small ground-glass nodules. Volume loss, atelectasis/scarring, and small chronic cyst in the left lower lobe. No pleural effusion or pneumothorax. Upper Abdomen: No acute abnormality. Musculoskeletal: No chest wall abnormality. No acute or significant osseous findings. Review of the MIP images confirms the above findings. IMPRESSION: 1. No evidence of pulmonary embolism. 2. Small peribronchovascular consolidation in the central right lower lobe with surrounding scattered small ground-glass nodules, suggestive of pneumonia. 3. Mildly  enlarged mediastinal and bilateral hilar lymph nodes, nonspecific, but likely reactive. 4. Mild cardiomegaly with reflux of contrast into the IVC and hepatic veins, suggestive of right heart dysfunction. 5. Aortic Atherosclerosis (ICD10-I70.0). Electronically Signed   By: Titus Dubin M.D.   On: 12/20/2021 15:48   DG Chest Portable 1 View  Result Date: 12/20/2021 CLINICAL DATA:  Shortness of breath, chest pain EXAM: PORTABLE CHEST 1 VIEW COMPARISON:  05/25/2021 FINDINGS: Transverse diameter of heart is increased. There are no signs of pulmonary edema or focal pulmonary consolidation. There is no pleural effusion or pneumothorax. IMPRESSION: Cardiomegaly. There are no signs of pulmonary edema or focal pulmonary consolidation. Electronically Signed   By: Elmer Picker M.D.   On: 12/20/2021 14:36    Procedures .Critical Care Performed by: Sherrill Raring, PA-C Authorized by: Sherrill Raring, PA-C   Critical care provider statement:    Critical care time (minutes):  30   Critical care start time:  12/20/2021 3:30 PM   Critical care end time:  12/20/2021 4:00 PM   Critical care time was exclusive of:  Separately billable procedures and treating other patients   Critical care was necessary to treat or prevent imminent or life-threatening deterioration of the following conditions:  Respiratory failure   Critical care was time spent personally by me on the following activities:  Development of treatment plan with patient or surrogate, discussions with consultants, evaluation of patient's response to treatment, examination of patient, ordering and review of laboratory studies, ordering and review of radiographic studies, ordering and performing treatments and interventions, pulse oximetry, re-evaluation of patient's condition and review of old charts   Care discussed with: admitting provider      Medications Ordered in ED Medications  ipratropium-albuterol (DUONEB) 0.5-2.5 (3) MG/3ML nebulizer solution 3  mL (has no administration in time range)  cefTRIAXone (ROCEPHIN) 1 g in sodium chloride 0.9 % 100 mL IVPB (1 g Intravenous New Bag/Given 12/20/21 1615)  azithromycin (ZITHROMAX) 500 mg in sodium chloride 0.9 % 250 mL IVPB (has no administration in time range)  ipratropium-albuterol (DUONEB) 0.5-2.5 (3) MG/3ML nebulizer solution 3 mL (3 mLs Nebulization Given 12/20/21 1509)  methylPREDNISolone sodium succinate (SOLU-MEDROL) 125 mg/2 mL injection 125 mg (125 mg Intravenous Given 12/20/21 1553)  iohexol (OMNIPAQUE) 350 MG/ML injection 100 mL (100 mLs Intravenous Contrast Given 12/20/21 1528)    ED Course/ Medical Decision Making/ A&P                           Medical Decision Making Amount and/or Complexity  of Data Reviewed Labs: ordered. Radiology: ordered.  Risk Prescription drug management. Decision regarding hospitalization.   This patient presents to the ED for concern of SOB and CP, this involves an extensive number of treatment options, and is a complaint that carries with it a high risk of complications and morbidity.  The differential diagnosis includes PE, pneumonia, COPD exacerbation, other   Co morbidities that complicate the patient evaluation: COPD, CAD, type 2 diabetes   Additional history obtained: -Additional history obtained from previous urgent care provider note taken earlier today.  Patient was newly hypoxic requiring oxygen, also tachypneic on exam. -External records from outside source obtained and reviewed including: Chart review including previous notes, labs, imaging, consultation notes   Lab Tests: -I ordered, reviewed, and interpreted labs.  The pertinent results include: No leukocytosis or anemia, no gross electrolyte derangement or AKI.  He is negative for COVID and flu, BNP is also within normal limits.     EKG -Sinus rhythm, no ischemic findings   Imaging Studies ordered: -I ordered imaging studies including chest x-ray and CTA -I independently  visualized and interpreted imaging which showed Chest x-ray notable for new cardiomegaly, CTA notable for right-sided pneumonia.  Also there is some underlying evidence of cardiomegaly and right heart dysfunction.  -I agree with the radiologist interpretation   Medicines ordered and prescription drug management: -I ordered medication including DuoNeb, Solu-Medrol for shortness of breath. ABX for PNA -Reevaluation of the patient after these medicines showed that the patient improved -I have reviewed the patients home medicines and have made adjustments as needed   Consultations Obtained: I requested consultation with the hospitalist,  and discussed lab and imaging findings as well as pertinent plan - they recommend: Admission   ED Course: Patient presented with new hypoxia, tachypnea, adventitious lung sounds bilaterally.  He normally does not ambulate with oxygen, newly requiring 3 L. Was 88% on RA at [facility].  DuoNeb given, some improvement of his shortness of breath.  He is still tachypneic, also still having chest pain.  CT shows evidence of pneumonia, I suspect the hypoxia is secondary to acute on chronic exacerbation of his COPD secondary to pneumonia.  There is no evidence of a PE on the CTA, there is some secondary evidence of right heart dysfunction.  Patient has a history of CAD, troponin is low making acute ACS unlikely.  Additionally there is cardiomegaly on the chest x-ray but his BNP is within normal limits.   Cardiac Monitoring: The patient was maintained on a cardiac monitor.  I personally viewed and interpreted the cardiac monitored which showed an underlying rhythm of: NSR   Reevaluation: After the interventions noted above, I reevaluated the patient and found that they have :stayed the same   Dispostion: Given new oxygen requirement and acute on chronic COPD exacerbation resulting in respiratory distress and new hypoxia new think patient should be admitted.  We will start  patient on IV antibiotics given PNA.          Final Clinical Impression(s) / ED Diagnoses Final diagnoses:  Acute on chronic respiratory failure with hypoxia ([place])  Community acquired pneumonia of right lung, unspecified part of lung    Rx / DC Orders ED Discharge Orders     None         Sherrill Raring, [place] 12/20/21 1628    Margette Fast, MD 12/31/21 [phone]

## 2021-12-20 NOTE — ED Triage Notes (Incomplete)
Pt reports cough and shortness of breath x 1 week.

## 2021-12-20 NOTE — H&P (Signed)
TRH H&P   Patient Demographics:    Pharell Rolfson, is a 70 y.o. male  MRN: [id number]   DOB - 16-Jun-1951  Admit Date - 12/20/2021  Outpatient Primary MD for the patient is Leonie Douglas, MD  Referring MD/NP/PA: PA Hildred Alamin   Patient coming from: home  Chief Complaint  Patient presents with   Shortness of Breath      HPI:    Rigo Letts  is a 71 y.o. male, with a history of coronary artery disease, hypertension, diabetes, COPD, no oxygen requirement at baseline, hyperlipidemia, CAD, prostate cancer, hypothyroidism, 4 last November, report he never felt back to baseline after his COVID infection, patient presents to ED secondary to complaints of shortness of breath, cough and chest tightness, as well reports cough is productive with orange-colored sputum, he does reports chest pain worsening with dyspnea, denies any fever, chills, reports feeling fatigue.  Patient went to urgent care, as was found to be hypoxic, was started on 4 L oxygen and was sent to ED for further evaluation -in ED given his chest pain CTA was obtained, no PE, but significant for pneumonia, EKG nonacute, first troponin is negative, he is on 4 L nasal cannula upon my evaluation, dips to 86% once put on room air, nevic and wheezing which has improved with steroids and nebulizer treatment, given his hypoxia, pneumonia, [facility] hospitalist consulted to admit.    Review of systems:    In addition to the HPI above,  A full 10 point Review of Systems was done, except as stated above, all other Review of Systems were negative.   With Past History of the following :    Past Medical History:  Diagnosis Date   CAD (coronary artery disease)    Cancer ([place])    Diabetes mellitus without complication ([place])    Hypertension    Stroke [facility])       Past Surgical History:  Procedure Laterality Date   bone grafts     TUMOR  EXCISION        Social History:     Social History   Tobacco Use   Smoking status: Never   Smokeless tobacco: Never  Substance Use Topics   Alcohol use: Never       Family History :     Family History  Problem Relation Age of Onset   Diabetes Mother    Diabetes Father       Home Medications:   Prior to Admission medications   Medication Sig Start Date End Date Taking? Authorizing Provider  albuterol (VENTOLIN HFA) 108 (90 Base) MCG/ACT inhaler Inhale 1-2 puffs into the lungs every 6 (six) hours as needed for shortness of breath or wheezing. 11/25/18   [provider]  amLODipine (NORVASC) 5 MG tablet Take 5 mg by mouth 2 (two) times daily. 02/21/21   [provider]  aspirin 81 MG EC  tablet Take 81 mg by mouth daily. 04/18/21 04/18/22  [provider]  carvedilol (COREG) 25 MG tablet Take 25 mg by mouth 2 (two) times daily. 02/21/21   [provider]  chlorpheniramine-HYDROcodone (TUSSIONEX PENNKINETIC ER) 10-8 MG/5ML SUER Take 5 mLs by mouth every 12 (twelve) hours as needed for cough. 05/27/21   Kathie Dike, MD  ezetimibe (ZETIA) 10 MG tablet Take 10 mg by mouth daily. 04/03/21   [provider]  finasteride (PROSCAR) 5 MG tablet Take 5 mg by mouth daily. 02/09/20   [provider]  glipiZIDE (GLUCOTROL XL) 5 MG 24 hr tablet Take 5 mg by mouth daily. 02/21/21   [provider]  guaiFENesin-dextromethorphan (ROBITUSSIN DM) 100-10 MG/5ML syrup Take 10 mLs by mouth every 6 (six) hours. 05/27/21   Kathie Dike, MD  hydrochlorothiazide (MICROZIDE) 12.5 MG capsule Take 12.5 mg by mouth daily. 11/29/20   [provider]  isosorbide mononitrate (IMDUR) 60 MG 24 hr tablet Take 60 mg by mouth daily. 02/27/21   [provider]  levothyroxine (SYNTHROID) 100 MCG tablet Take 100 mcg by mouth daily. 04/18/21   [provider]  losartan (COZAAR) 100 MG tablet Take 100 mg by mouth daily. 01/12/20   [provider]  metFORMIN (GLUCOPHAGE-XR) 500 MG 24 hr tablet Take 1,000 mg by mouth daily with breakfast. 01/12/20   [provider]  nabumetone (RELAFEN) 750 MG tablet Take 750 mg by mouth 2 (two) times daily. 02/21/21   [provider]  predniSONE (DELTASONE) 50 MG tablet Take 1 tablet (50 mg total) by mouth daily. 05/28/21   Kathie Dike, MD  rosuvastatin (CRESTOR) 5 MG tablet Take 5 mg by mouth daily. 04/26/21   [provider]  tamsulosin (FLOMAX) 0.4 MG CAPS capsule Take 0.4 mg by mouth 2 (two) times daily. 12/23/19   [provider]     Allergies:     Allergies  Allergen Reactions   Lisinopril     Medicines ending in -pril   Statins      Physical Exam:   Vitals  Blood pressure (!) 150/70, pulse 83, temperature 97.9 F (36.6 C), temperature source Oral, resp. rate (!) 25, height 5\' 10"  (1.778 m), weight 108.9 kg, SpO2 93 %.   1. General developed male, laying in bed in mild discomfort due to dyspnea  2. Normal affect and insight, Not Suicidal or Homicidal, Awake Alert, Oriented X 3.  3. No F.N deficits, ALL C.Nerves Intact, Strength 5/5 all 4 extremities, Sensation intact all 4 extremities, Plantars down going.  4. Ears and Eyes appear Normal, Conjunctivae clear, PERRLA. Moist Oral Mucosa.  5. Supple Neck, No JVD, No cervical lymphadenopathy appriciated, No Carotid Bruits.  6. Symmetrical Chest wall movement, Minister entry at the bases, with scattered wheezing  7. RRR, No Gallops, Rubs or Murmurs, No Parasternal Heave.  8. Positive Bowel Sounds, Abdomen Soft, No tenderness, No organomegaly appriciated,No rebound -guarding or rigidity.  9.  No Cyanosis, Normal Skin Turgor, No Skin Rash or Bruise.  10. Good muscle tone,  joints appear normal , no effusions, Normal ROM.     Data Review:    CBC Recent Labs  Lab 12/20/21 1412  WBC 6.4  HGB 14.4  HCT 44.1  PLT 158  MCV 88.4  MCH 28.9  MCHC 32.7  RDW 13.9  LYMPHSABS 1.7   MONOABS 0.8  EOSABS 0.5  BASOSABS 0.0   ------------------------------------------------------------------------------------------------------------------  Chemistries  Recent Labs  Lab 12/20/21 1412  NA 141  K 3.3*  CL 101  CO2 30  GLUCOSE 139*  BUN 24*  CREATININE 1.16  CALCIUM 9.0  AST 20  ALT 23  ALKPHOS 85  BILITOT 0.6   ------------------------------------------------------------------------------------------------------------------ estimated creatinine clearance is 73.3 mL/min (by C-G formula based on SCr of 1.16 mg/dL). ------------------------------------------------------------------------------------------------------------------ No results for input(s): TSH, T4TOTAL, T3FREE, THYROIDAB in the last 72 hours.  Invalid input(s): FREET3  Coagulation profile No results for input(s): INR, PROTIME in the last 168 hours. ------------------------------------------------------------------------------------------------------------------- No results for input(s): DDIMER in the last 72 hours. -------------------------------------------------------------------------------------------------------------------  Cardiac Enzymes No results for input(s): CKMB, TROPONINI, MYOGLOBIN in the last 168 hours.  Invalid input(s): CK ------------------------------------------------------------------------------------------------------------------    Component Value Date/Time   BNP 24.0 12/20/2021 1413     ---------------------------------------------------------------------------------------------------------------  Urinalysis    Component Value Date/Time   COLORURINE YELLOW 05/25/2021 0336   APPEARANCEUR CLEAR 05/25/2021 0336   LABSPEC 1.013 05/25/2021 0336   PHURINE 7.0 05/25/2021 0336   GLUCOSEU NEGATIVE 05/25/2021 0336   HGBUR SMALL (A) 05/25/2021 0336   BILIRUBINUR NEGATIVE 05/25/2021 0336   KETONESUR NEGATIVE 05/25/2021 0336   PROTEINUR NEGATIVE 05/25/2021 0336    NITRITE NEGATIVE 05/25/2021 0336   LEUKOCYTESUR SMALL (A) 05/25/2021 0336    ----------------------------------------------------------------------------------------------------------------   Imaging Results:    CT Angio Chest PE W/Cm &/Or Wo Cm  Result Date: 12/20/2021 CLINICAL DATA:  Cough and shortness of breath for the past week. EXAM: CT ANGIOGRAPHY CHEST WITH CONTRAST TECHNIQUE: Multidetector CT imaging of the chest was performed using the standard protocol during bolus administration of intravenous contrast. Multiplanar CT image reconstructions and MIPs were obtained to evaluate the vascular anatomy. RADIATION DOSE REDUCTION: This exam was performed according to the departmental dose-optimization program which includes automated exposure control, adjustment of the mA and/or kV according to patient size and/or use of iterative reconstruction technique. CONTRAST:  169mL OMNIPAQUE IOHEXOL 350 MG/ML SOLN COMPARISON:  Chest x-ray from same day. FINDINGS: Cardiovascular: Satisfactory opacification of the pulmonary arteries to the segmental level. No evidence of pulmonary embolism. Mild cardiomegaly. Trace pericardial fluid. Reflux of contrast into the IVC and hepatic veins. No thoracic aortic aneurysm or dissection. Coronary, aortic arch, and branch vessel atherosclerotic vascular disease. Mediastinum/Nodes: Mildly enlarged mediastinal and bilateral hilar lymph nodes measuring up to 1.4 cm in short axis. The thyroid gland, trachea, and esophagus demonstrate no significant findings. Lungs/Pleura: Prominent central peribronchial thickening. Small peribronchovascular consolidation in the central right lower lobe with surrounding scattered small ground-glass nodules. Volume loss, atelectasis/scarring, and small chronic cyst in the left lower lobe. No pleural effusion or pneumothorax. Upper Abdomen: No acute abnormality. Musculoskeletal: No chest wall abnormality. No acute or significant osseous findings.  Review of the MIP images confirms the above findings. IMPRESSION: 1. No evidence of pulmonary embolism. 2. Small peribronchovascular consolidation in the central right lower lobe with surrounding scattered small ground-glass nodules, suggestive of pneumonia. 3. Mildly enlarged mediastinal and bilateral hilar lymph nodes, nonspecific, but likely reactive. 4. Mild cardiomegaly with reflux of contrast into the IVC and hepatic veins, suggestive of right heart dysfunction. 5. Aortic Atherosclerosis (ICD10-I70.0). Electronically Signed   By: Titus Dubin M.D.   On: 12/20/2021 15:48   DG Chest Portable 1 View  Result Date: 12/20/2021 CLINICAL DATA:  Shortness of breath, chest pain EXAM: PORTABLE CHEST 1 VIEW COMPARISON:  05/25/2021 FINDINGS: Transverse diameter of heart is increased. There are no signs of pulmonary edema or focal pulmonary consolidation. There is no pleural effusion or pneumothorax. IMPRESSION: Cardiomegaly. There are no signs of  pulmonary edema or focal pulmonary consolidation. Electronically Signed   By: Elmer Picker M.D.   On: 12/20/2021 14:36    My personal review of EKG: Rhythm NSR, Rate  89 /min, QTc 451   Assessment & Plan:    Principal Problem:   Pneumonia Active Problems:   Acute respiratory failure with hypoxia (HCC)   CAP (community acquired pneumonia)   COPD (chronic obstructive pulmonary disease) (HCC)   CAD (coronary artery disease)   DM (diabetes mellitus), type 2 (HCC)   HTN (hypertension)   HLD (hyperlipidemia)   Hypothyroidism   Acute respiratory failure with hypoxia -86 to 87% on room air, was on 3 L nasal cannula, now up to 4 L upon my evaluation. -Secondary to pneumonia and COPD exacerbation. -Was encouraged use incentive spirometry, flutter valve.  Pneumonia -CT chest significant for left pneumonia, will check sputum culture, Legionella and strep pneumonia antigen, will keep on IV Rocephin and azithromycin, started on Mucinex, he was encouraged to  use incentive spirometry and flutter valve.  COPD exacerbation -Significant wheezing on presentation, improved with nebulizer treatment and steroids, continue with scheduled DuoNebs, as needed albuterol, will start on IV Solu-Medrol.   CAD.  Chest pain -Chest pain nontypical, likely related to cough, first troponin is reassuring, will trend troponins, EKG nonacute -Continue with aspirin, statin, beta-blockers, Imdur and lisinopril.  Diabetes -Oral agents glipizide and metformin during hospital stay, will keep on insulin sliding scale as he will be on steroids.   Hypertension -Continue with Coreg, losartan, will resume amlodipine and hydrochlorothiazide if blood pressure is elevated    Hypothyroidism -Continue with Synthroid   Hyperlipidemia -Continue with statin   Prostate cancer -Patient is on tamsulosin and finasteride, -Cardiology   DVT Prophylaxis Heparin -  Lovenox  AM Labs Ordered, also please review Full Orders  Family Communication: Admission, patients condition and plan of care including tests being ordered have been discussed with the patient who indicate understanding and agree with the plan and Code Status.  Code Status Full  Likely DC to  home  Condition GUARDED    Consults called: none    Admission status: inpatient    Time spent in minutes : 65 minutes   Phillips Climes M.D on 12/20/2021 at 4:30 PM   [facility] - Office  [phone]

## 2021-12-20 NOTE — ED Triage Notes (Signed)
Pt reports shortness of breath, chest pain, congested cough x1 week. Pt noted to have moderate dyspnea ambulating to room for triage. Pt room air saturation 88%. Pt placed on 2 liters of oxygen. PA aware and reported to send pt to [facility] ED. EMS called.   Pt provided number for visitor to be picked up.

## 2021-12-20 NOTE — ED Triage Notes (Signed)
Pt presents to ED with complaints of shortness of breath, congested cough x 1 week. Pt sent from UC for O2 sat 88% RA. Pt placed on 4L per [place] by EMS, sats increased to 97%

## 2021-12-20 NOTE — ED Provider Notes (Signed)
[facility]    CSN: [id number] Arrival date & time: 12/20/21  1315      History   Chief Complaint Chief Complaint  Patient presents with   Cough   Shortness of Breath    HPI Robert Brown is a 71 y.o. male.   Patient presenting today with about a week of progressively worsening chest tightness, shortness of breath, productive cough, fatigue.  Not aware of any fevers, body aches, nausea, vomiting, diarrhea.  Tried taking some over-the-counter decongestants and his albuterol inhaler with no relief and he continues to worsen to where he has been unable to sleep for the past day because he cannot catch his breath.  He has a history of COPD on albuterol only, states he has had to be hospitalized for respiratory failure in the past.  Does not use oxygen at home.  Also has a history of hypertension, diabetes, hyperlipidemia, coronary artery disease, prostate cancer.   Past Medical History:  Diagnosis Date   CAD (coronary artery disease)    Cancer ([place])    Diabetes mellitus without complication ([place])    Hypertension    Stroke [facility])     Patient Active Problem List   Diagnosis Date Noted   Acute respiratory failure with hypoxia ([place]) 05/25/2021   COVID-19 virus infection 05/25/2021   HTN (hypertension) 05/25/2021   HLD (hyperlipidemia) 05/25/2021   DM (diabetes mellitus), type 2 ([place]) 05/25/2021   CAD (coronary artery disease) 05/25/2021   Hypothyroidism 05/25/2021   Prostate cancer ([place]) 05/25/2021   COPD (chronic obstructive pulmonary disease) ([place]) 05/25/2021    Past Surgical History:  Procedure Laterality Date   bone grafts     TUMOR EXCISION         Home Medications    Prior to Admission medications   Medication Sig Start Date End Date Taking? Authorizing Provider  albuterol (VENTOLIN HFA) 108 (90 Base) MCG/ACT inhaler Inhale 1-2 puffs into the lungs every 6 (six) hours as needed for shortness of breath or wheezing. 11/25/18   [provider]   amLODipine (NORVASC) 5 MG tablet Take 5 mg by mouth 2 (two) times daily. 02/21/21   [provider]  aspirin 81 MG EC tablet Take 81 mg by mouth daily. 04/18/21 04/18/22  [provider]  carvedilol (COREG) 25 MG tablet Take 25 mg by mouth 2 (two) times daily. 02/21/21   [provider]  chlorpheniramine-HYDROcodone (TUSSIONEX PENNKINETIC ER) 10-8 MG/5ML SUER Take 5 mLs by mouth every 12 (twelve) hours as needed for cough. 05/27/21   Kathie Dike, MD  ezetimibe (ZETIA) 10 MG tablet Take 10 mg by mouth daily. 04/03/21   [provider]  finasteride (PROSCAR) 5 MG tablet Take 5 mg by mouth daily. 02/09/20   [provider]  glipiZIDE (GLUCOTROL XL) 5 MG 24 hr tablet Take 5 mg by mouth daily. 02/21/21   [provider]  guaiFENesin-dextromethorphan (ROBITUSSIN DM) 100-10 MG/5ML syrup Take 10 mLs by mouth every 6 (six) hours. 05/27/21   Kathie Dike, MD  hydrochlorothiazide (MICROZIDE) 12.5 MG capsule Take 12.5 mg by mouth daily. 11/29/20   [provider]  isosorbide mononitrate (IMDUR) 60 MG 24 hr tablet Take 60 mg by mouth daily. 02/27/21   [provider]  levothyroxine (SYNTHROID) 100 MCG tablet Take 100 mcg by mouth daily. 04/18/21   [provider]  losartan (COZAAR) 100 MG tablet Take 100 mg by mouth daily. 01/12/20   [provider]  metFORMIN (GLUCOPHAGE-XR) 500 MG 24 hr  tablet Take 1,000 mg by mouth daily with breakfast. 01/12/20   [provider]  nabumetone (RELAFEN) 750 MG tablet Take 750 mg by mouth 2 (two) times daily. 02/21/21   [provider]  predniSONE (DELTASONE) 50 MG tablet Take 1 tablet (50 mg total) by mouth daily. 05/28/21   Kathie Dike, MD  rosuvastatin (CRESTOR) 5 MG tablet Take 5 mg by mouth daily. 04/26/21   [provider]  tamsulosin (FLOMAX) 0.4 MG CAPS capsule Take 0.4 mg by mouth 2 (two) times daily. 12/23/19   [provider]    Family History Family  History  Problem Relation Age of Onset   Diabetes Mother    Diabetes Father     Social History Social History   Tobacco Use   Smoking status: Never   Smokeless tobacco: Never  Vaping Use   Vaping Use: Never used  Substance Use Topics   Alcohol use: Never   Drug use: Never     Allergies   Lisinopril and Statins   Review of Systems Review of Systems Per HPI  Physical Exam Triage Vital Signs ED Triage Vitals  Enc Vitals Group     BP 12/20/21 1335 (!) 191/112     Pulse Rate 12/20/21 1335 87     Resp 12/20/21 1335 (!) 26     Temp 12/20/21 1335 97.7 F (36.5 C)     Temp Source 12/20/21 1335 Oral     SpO2 12/20/21 1335 (!) 88 %     Weight 12/20/21 1337 250 lb (113.4 kg)     Height 12/20/21 1337 5\' 8"  (1.727 m)     Head Circumference --      Peak Flow --      Pain Score --      Pain Loc --      Pain Edu? --      Excl. in [place]? --    No data found.  Updated Vital Signs BP (!) 191/112 (BP Location: Right Arm)    Pulse 87    Temp 97.7 F (36.5 C) (Oral)    Resp (!) 26    Ht 5\' 8"  (1.727 m)    Wt 250 lb (113.4 kg)    SpO2 90%    BMI 38.01 kg/m   Visual Acuity Right Eye Distance:   Left Eye Distance:   Bilateral Distance:    Right Eye Near:   Left Eye Near:    Bilateral Near:     Physical Exam Vitals and nursing note reviewed.  Constitutional:      General: He is in acute distress.     Appearance: He is well-developed. He is ill-appearing.  HENT:     Head: Atraumatic.     Right Ear: External ear normal.     Left Ear: External ear normal.     Nose: Rhinorrhea present.     Mouth/Throat:     Mouth: Mucous membranes are moist.     Pharynx: Posterior oropharyngeal erythema present. No oropharyngeal exudate.  Eyes:     Conjunctiva/sclera: Conjunctivae normal.     Pupils: Pupils are equal, round, and reactive to light.  Cardiovascular:     Rate and Rhythm: Normal rate and regular rhythm.  Pulmonary:     Effort: Respiratory distress present.     Breath  sounds: Wheezing and rales present.     Comments: Not able to speak in full sentences even on oxygen via nasal cannula.  Using accessory muscles.  Audible wheezes and crackles with  speech Musculoskeletal:     Cervical back: Normal range of motion and neck supple.  Lymphadenopathy:     Cervical: No cervical adenopathy.  Skin:    General: Skin is warm and dry.  Neurological:     Mental Status: He is alert and oriented to person, place, and time.  Psychiatric:        Behavior: Behavior normal.     UC Treatments / Results  Labs (all labs ordered are listed, but only abnormal results are displayed) Labs Reviewed - No data to display  EKG   Radiology CT Angio Chest PE W/Cm &/Or Wo Cm  Result Date: 12/20/2021 CLINICAL DATA:  Cough and shortness of breath for the past week. EXAM: CT ANGIOGRAPHY CHEST WITH CONTRAST TECHNIQUE: Multidetector CT imaging of the chest was performed using the standard protocol during bolus administration of intravenous contrast. Multiplanar CT image reconstructions and MIPs were obtained to evaluate the vascular anatomy. RADIATION DOSE REDUCTION: This exam was performed according to the departmental dose-optimization program which includes automated exposure control, adjustment of the mA and/or kV according to patient size and/or use of iterative reconstruction technique. CONTRAST:  1110mL OMNIPAQUE IOHEXOL 350 MG/ML SOLN COMPARISON:  Chest x-ray from same day. FINDINGS: Cardiovascular: Satisfactory opacification of the pulmonary arteries to the segmental level. No evidence of pulmonary embolism. Mild cardiomegaly. Trace pericardial fluid. Reflux of contrast into the IVC and hepatic veins. No thoracic aortic aneurysm or dissection. Coronary, aortic arch, and branch vessel atherosclerotic vascular disease. Mediastinum/Nodes: Mildly enlarged mediastinal and bilateral hilar lymph nodes measuring up to 1.4 cm in short axis. The thyroid gland, trachea, and esophagus demonstrate  no significant findings. Lungs/Pleura: Prominent central peribronchial thickening. Small peribronchovascular consolidation in the central right lower lobe with surrounding scattered small ground-glass nodules. Volume loss, atelectasis/scarring, and small chronic cyst in the left lower lobe. No pleural effusion or pneumothorax. Upper Abdomen: No acute abnormality. Musculoskeletal: No chest wall abnormality. No acute or significant osseous findings. Review of the MIP images confirms the above findings. IMPRESSION: 1. No evidence of pulmonary embolism. 2. Small peribronchovascular consolidation in the central right lower lobe with surrounding scattered small ground-glass nodules, suggestive of pneumonia. 3. Mildly enlarged mediastinal and bilateral hilar lymph nodes, nonspecific, but likely reactive. 4. Mild cardiomegaly with reflux of contrast into the IVC and hepatic veins, suggestive of right heart dysfunction. 5. Aortic Atherosclerosis (ICD10-I70.0). Electronically Signed   By: Titus Dubin M.D.   On: 12/20/2021 15:48   DG Chest Portable 1 View  Result Date: 12/20/2021 CLINICAL DATA:  Shortness of breath, chest pain EXAM: PORTABLE CHEST 1 VIEW COMPARISON:  05/25/2021 FINDINGS: Transverse diameter of heart is increased. There are no signs of pulmonary edema or focal pulmonary consolidation. There is no pleural effusion or pneumothorax. IMPRESSION: Cardiomegaly. There are no signs of pulmonary edema or focal pulmonary consolidation. Electronically Signed   By: Elmer Picker M.D.   On: 12/20/2021 14:36    Procedures Procedures (including critical care time)  Medications Ordered in UC Medications - No data to display  Initial Impression / Assessment and Plan / UC Course  I have reviewed the triage vital signs and the nursing notes.  Pertinent labs & imaging results that were available during my care of the patient were reviewed by me and considered in my medical decision making (see chart for  details).     Significantly hypertensive, tachypneic, hypoxic at 88% on room air in triage.  He was immediately placed on 2 L of O2 via nasal  cannula in triage which allowed his oxygen to improve to 95% though he was still significantly short of breath.  Discussed with patient that he will require further management in the emergency department which she is agreeable to going via EMS transport.  EMS was called and successfully transported him to the hospital.  Final Clinical Impressions(s) / UC Diagnoses   Final diagnoses:  Acute respiratory failure with hypoxia ([place])  COPD exacerbation ([place])  Hypertensive urgency   Discharge Instructions   None    ED Prescriptions   None    PDMP not reviewed this encounter.   Volney American, [place] 12/20/21 1622

## 2021-12-21 DIAGNOSIS — E039 Hypothyroidism, unspecified: Secondary | ICD-10-CM

## 2021-12-21 DIAGNOSIS — I10 Essential (primary) hypertension: Secondary | ICD-10-CM

## 2021-12-21 DIAGNOSIS — J441 Chronic obstructive pulmonary disease with (acute) exacerbation: Secondary | ICD-10-CM

## 2021-12-21 DIAGNOSIS — E1159 Type 2 diabetes mellitus with other circulatory complications: Secondary | ICD-10-CM

## 2021-12-21 DIAGNOSIS — J9601 Acute respiratory failure with hypoxia: Secondary | ICD-10-CM

## 2021-12-21 DIAGNOSIS — C61 Malignant neoplasm of prostate: Secondary | ICD-10-CM

## 2021-12-21 DIAGNOSIS — E785 Hyperlipidemia, unspecified: Secondary | ICD-10-CM

## 2021-12-21 LAB — CBC
HCT: 42.1 % (ref 39.0–52.0)
Hemoglobin: 13.3 g/dL (ref 13.0–17.0)
MCH: 28.1 pg (ref 26.0–34.0)
MCHC: 31.6 g/dL (ref 30.0–36.0)
MCV: 89 fL (ref 80.0–100.0)
Platelets: 168 10*3/uL (ref 150–400)
RBC: 4.73 MIL/uL (ref 4.22–5.81)
RDW: 13.9 % (ref 11.5–15.5)
WBC: 6.3 10*3/uL (ref 4.0–10.5)
nRBC: 0 % (ref 0.0–0.2)

## 2021-12-21 LAB — GLUCOSE, CAPILLARY
Glucose-Capillary: 218 mg/dL — ABNORMAL HIGH (ref 70–99)
Glucose-Capillary: 207 mg/dL — ABNORMAL HIGH (ref 70–99)
Glucose-Capillary: 241 mg/dL — ABNORMAL HIGH (ref 70–99)
Glucose-Capillary: 274 mg/dL — ABNORMAL HIGH (ref 70–99)

## 2021-12-21 LAB — BASIC METABOLIC PANEL
Anion gap: 9 (ref 5–15)
BUN: 22 mg/dL (ref 8–23)
CO2: 29 mmol/L (ref 22–32)
Calcium: 8.7 mg/dL — ABNORMAL LOW (ref 8.9–10.3)
Chloride: 101 mmol/L (ref 98–111)
Creatinine, Ser: 0.93 mg/dL (ref 0.61–1.24)
GFR, Estimated: >60 mL/min (ref >60)
Glucose, Bld: 223 mg/dL — ABNORMAL HIGH (ref 70–99)
Potassium: 3.6 mmol/L (ref 3.5–5.1)
Sodium: 139 mmol/L (ref 135–145)

## 2021-12-21 MED ORDER — BUDESONIDE 0.5 MG/2ML IN SUSP
0.5000 mg | Freq: Two times a day (BID) | RESPIRATORY_TRACT | Status: DC
  Administered 2021-12-21 – 2021-12-23 (×5): 0.5 mg via RESPIRATORY_TRACT
  Filled 2021-12-21 (×5): qty 2

## 2021-12-21 NOTE — Assessment & Plan Note (Addendum)
-  Stable overall -Continue current antihypertensive regimen -Continue heart healthy diet. -Reassess blood pressure at follow-up visit with further adjustment of antihypertensive treatment as needed.

## 2021-12-21 NOTE — Assessment & Plan Note (Signed)
-  History of prostate cancer and BPH -Continue treatment with Flomax and Proscar. -Reports no complaints of urinary retention currently. -Continue patient follow-up with urology service.

## 2021-12-21 NOTE — Assessment & Plan Note (Addendum)
-  No complaining of chest pain -Continue treatment with Coreg, losartan, aspirin, Imdur and statin. -Continue risk factor modifications and outpatient follow-up with cardiology service.

## 2021-12-21 NOTE — Progress Notes (Signed)
Went over [employer] with patient.  Patient was able to achieve 500 ml with good patient effort.  Left at bedside.

## 2021-12-21 NOTE — Progress Notes (Signed)
Progress Note   Patient: Robert Brown ALP:[id number] DOB: Dec 03, 1950 DOA: 12/20/2021     1 DOS: the patient was seen and examined on 12/21/2021   Brief admission narrative: As per H&P written by Dr. Waldron Labs on 12/20/2021  Robert Brown  is a 71 y.o. male, with a history of coronary artery disease, hypertension, diabetes, COPD, no oxygen requirement at baseline, hyperlipidemia, CAD, prostate cancer, hypothyroidism, 12 last November, report he never felt back to baseline after his COVID infection, patient presents to ED secondary to complaints of shortness of breath, cough and chest tightness, as well reports cough is productive with orange-colored sputum, he does reports chest pain worsening with dyspnea, denies any fever, chills, reports feeling fatigue.  Patient went to urgent care, as was found to be hypoxic, was started on 4 L oxygen and was sent to ED for further evaluation -in ED given his chest pain CTA was obtained, no PE, but significant for pneumonia, EKG nonacute, first troponin is negative, he is on 4 L nasal cannula upon my evaluation, dips to 86% once put on room air, nevic and wheezing which has improved with steroids and nebulizer treatment, given his hypoxia, pneumonia, [facility] hospitalist consulted to admit.  Assessment and Plan: * Acute respiratory failure with hypoxia ([place])- (present on admission) -In the setting of community-acquired pneumonia and COPD -Continue treatment with steroids, Abx's, mucolytics and bronchodilators -Brovana and pulmicort initiated -patient started on flutter valve -wean off O2 supplementation as tolerated and check desaturation screening.   CAP (community acquired pneumonia) -As mentioned above patient presenting community-acquired pneumonia -Continue current antibiotics and follow culture results.  COPD (chronic obstructive pulmonary disease) ([place])- (present on admission) -With acute exacerbation as mentioned above -Continue treatment with the steroids,  mucolytic's and bronchodilator management. -Wean off oxygen supplementation as tolerated.  Prostate cancer ([place])- (present on admission) -History of prostate cancer and BPH -Continue treatment with Flomax and Proscar. -Reports no complaints of urinary retention currently. -Continue patient follow-up with urology service.  Hypothyroidism- (present on admission) -Continue Synthroid.  CAD (coronary artery disease)- (present on admission) -No complaining of chest pain -Continue treatment with Coreg, aspirin, Imdur and statin.   DM (diabetes mellitus), type 2 (HCC) -Continue sliding scale insulin -Follow CBGs and adjust hypoglycemic regimen as needed. -Discussed with patient importance of medication compliance and modify carbohydrate diet. -A1c 7.0  HLD (hyperlipidemia)- (present on admission) -Continue statins -Heart healthy diet discussed with patient.  HTN (hypertension)- (present on admission) -Stable overall -Continue current antihypertensive regimen -Continue heart healthy diet.   Class I obesity -Body mass index is 34.44 kg/m. -Low calorie diet, portion control and increase physical activity discussed with patient.  Subjective:  With difficulty speaking in full sentences; complaining of intermittent coughing spells and short winded sensation with activity.  2 L nasal cannula supplementation in place.  Physical Exam: Vitals:   12/21/21 0900 12/21/21 0952 12/21/21 1100 12/21/21 1319  BP:    (!) 160/81  Pulse:    76  Resp:      Temp:   97.8 F (36.6 C) 98.4 F (36.9 C)  TempSrc:   Oral Oral  SpO2: 91% 92% 93% 91%  Weight:      Height:       General exam: Alert, awake, oriented x 3; short winded with minimal activity and experiencing difficulty speaking in full sentences.  Using 2 L nasal cannula supplementation. Respiratory system: Decreased air movement bilaterally; positive expiratory wheezing and positive rhonchi.  No using accessory muscle. Cardiovascular  system:RRR. No  murmurs, rubs, gallops.  No JVD. Gastrointestinal system: Abdomen is obese, nondistended, soft and nontender. No organomegaly or masses felt. Normal bowel sounds heard. Central nervous system: Alert and oriented. No focal neurological deficits. Extremities: No cyanosis or clubbing; trace edema appreciated bilaterally. Skin: No petechiae. Psychiatry: Judgement and insight appear normal. Mood & affect appropriate.    Data Reviewed: CBGs in the 200 range given steroids usage. CT angiogram demonstrated no acute pulmonary embolism; but was positive a small peribronchovascular consolidation in the central right lower lobe with surrounding scattered small groundglass nodules suggestive of pneumonia.   Family Communication: Friend at bedside.  Disposition: Status is: Inpatient Remains inpatient appropriate because: Continue treatment with the steroids, bronchodilators and IV antibiotics.  Patient is still short of breath with minimal activity and having difficulty speaking in full sentences.   Planned Discharge Destination: Home   Author: Barton Dubois, MD 12/21/2021 2:20 PM  For on call review [email or web address].

## 2021-12-21 NOTE — Assessment & Plan Note (Addendum)
-  Continue Synthroid. -Continue to follow thyroid panel intermittently as an outpatient.

## 2021-12-21 NOTE — Assessment & Plan Note (Addendum)
-  In the setting of community-acquired pneumonia and COPD exacerbation. -Continue treatment with steroids tapering, mucolytic's and bronchodilator management. -At discharge patient not requiring oxygen supplementation. -Continue the use of flutter valve and incentive spirometer has been requested. -Recommending outpatient follow-up with pulmonologist for PFTs and further adjustment on maintenance therapy for COPD.

## 2021-12-21 NOTE — Progress Notes (Signed)
SATURATION QUALIFICATIONS: (This note is used to comply with regulatory documentation for home oxygen)  Patient Saturations on Room Air at Rest = 91%  Patient Saturations on Room Air while Ambulating = 91%  Patient Saturations on 2 Liters of oxygen while Ambulating = 95%

## 2021-12-21 NOTE — Assessment & Plan Note (Addendum)
-  Continue statins -Heart healthy diet discussed with patient. -Continue to follow LFTs and lipid panel as an outpatient.

## 2021-12-21 NOTE — Assessment & Plan Note (Addendum)
-  With acute exacerbation as mentioned above. -Continue treatment with steroids (tapering dose), mucolytic's and bronchodilator management. -Currently off oxygen and not requiring supplementation. -Encourage to use flutter valve and incentive spirometer as instructed. -Patient instructed to arrange outpatient follow-up with pulmonologist for PFTs and further adjustment to maintenance therapy. -Discharged on as needed albuterol, Breo Ellipta for maintenance and PPI therapy.

## 2021-12-21 NOTE — Assessment & Plan Note (Addendum)
-  As mentioned above patient presenting community-acquired pneumonia -Continue antibiotic management -Patient is afebrile and not requiring oxygen supplementation. -Elevation in his WBCs in the presence of his steroid usage. -Repeat chest x-ray in 6-8 weeks to assure complete resolution of infiltrates.

## 2021-12-21 NOTE — Progress Notes (Signed)
Nursing students worked with pt today and gave him morning medication. Students also helped with the o2 saturation screening. PT is currently on RA and is at 93%. No complaints of being SOB at this time.

## 2021-12-21 NOTE — Assessment & Plan Note (Addendum)
-  Patient receiving sliding scale insulin and long-acting basal insulin while inpatient. -CBGs overall well controlled. -Resume home hypoglycemic regimen and follow modified, hydrate diet. -Some elevation on his CBGs will be anticipated while receiving steroid therapy. -Continue to follow blood glucose levels/A1c with further adjustment to hypoglycemic regimen as required.   -A1c 7.0 demonstrating good diabetic control.

## 2021-12-22 LAB — GLUCOSE, CAPILLARY
Glucose-Capillary: 182 mg/dL — ABNORMAL HIGH (ref 70–99)
Glucose-Capillary: 260 mg/dL — ABNORMAL HIGH (ref 70–99)
Glucose-Capillary: 226 mg/dL — ABNORMAL HIGH (ref 70–99)
Glucose-Capillary: 208 mg/dL — ABNORMAL HIGH (ref 70–99)

## 2021-12-22 MED ORDER — TEMAZEPAM 15 MG PO CAPS: 15.0000 mg | ORAL_CAPSULE | Freq: Every evening | ORAL | Status: DC | PRN

## 2021-12-22 MED ORDER — INSULIN DETEMIR 100 UNIT/ML ~~LOC~~ SOLN
10.0000 [IU] | Freq: Every day | SUBCUTANEOUS | Status: DC
  Administered 2021-12-22: 10 [IU] via SUBCUTANEOUS
  Filled 2021-12-22 (×2): qty 0.1

## 2021-12-22 MED ORDER — CEFDINIR 300 MG PO CAPS
300.0000 mg | ORAL_CAPSULE | Freq: Two times a day (BID) | ORAL | Status: DC
  Administered 2021-12-22 – 2021-12-23 (×2): 300 mg via ORAL
  Filled 2021-12-22 (×2): qty 1

## 2021-12-22 MED ORDER — LORAZEPAM 2 MG/ML IJ SOLN
0.5000 mg | Freq: Once | INTRAMUSCULAR | Status: AC
  Administered 2021-12-22: 0.5 mg via INTRAVENOUS
  Filled 2021-12-22: qty 1

## 2021-12-22 MED ORDER — ARFORMOTEROL TARTRATE 15 MCG/2ML IN NEBU
15.0000 ug | INHALATION_SOLUTION | Freq: Two times a day (BID) | RESPIRATORY_TRACT | Status: DC
  Administered 2021-12-22 – 2021-12-23 (×2): 15 ug via RESPIRATORY_TRACT
  Filled 2021-12-22 (×2): qty 2

## 2021-12-22 MED ORDER — PREDNISONE 20 MG PO TABS
60.0000 mg | ORAL_TABLET | Freq: Every day | ORAL | Status: DC
  Administered 2021-12-23: 60 mg via ORAL
  Filled 2021-12-22: qty 3

## 2021-12-22 NOTE — Progress Notes (Signed)
Progress Note   Patient: Robert Brown YOV:[id number] DOB: 1951/10/30 DOA: 12/20/2021     2 DOS: the patient was seen and examined on 12/22/2021   Brief admission narrative: As per H&P written by Dr. Waldron Labs on 12/20/2021  Robert Brown  is a 71 y.o. male, with a history of coronary artery disease, hypertension, diabetes, COPD, no oxygen requirement at baseline, hyperlipidemia, CAD, prostate cancer, hypothyroidism, 41 last November, report he never felt back to baseline after his COVID infection, patient presents to ED secondary to complaints of shortness of breath, cough and chest tightness, as well reports cough is productive with orange-colored sputum, he does reports chest pain worsening with dyspnea, denies any fever, chills, reports feeling fatigue.  Patient went to urgent care, as was found to be hypoxic, was started on 4 L oxygen and was sent to ED for further evaluation -in ED given his chest pain CTA was obtained, no PE, but significant for pneumonia, EKG nonacute, first troponin is negative, he is on 4 L nasal cannula upon my evaluation, dips to 86% once put on room air, nevic and wheezing which has improved with steroids and nebulizer treatment, given his hypoxia, pneumonia, [facility] hospitalist consulted to admit.  Assessment and Plan: * Acute respiratory failure with hypoxia ([place])- (present on admission) -In the setting of community-acquired pneumonia and COPD -Continue treatment with steroids, mucolytic's and bronchodilators -Will also continue treatment with Brovana and pulmicort  -Flutter valve and incentive spirometer has been requested. -Antibiotics will be transition to oral route. -Patient no requiring oxygen supplementation at this time.  But is still with significant difficulty speaking in full sentences and feeling short winded with activity.  CAP (community acquired pneumonia) -As mentioned above patient presenting community-acquired pneumonia -Continue antibiotic management;  will transition to oral route in preparation for discharge. -Patient is afebrile.  COPD (chronic obstructive pulmonary disease) ([place])- (present on admission) -With acute exacerbation as mentioned above -Continue treatment with the steroids (dose has been adjusted), mucolytic's and bronchodilator management. -Currently off oxygen supplementation especially at rest; still having difficulty speaking in full sentences and short winded with activity. -Continue management. -Encourage to use flutter valve and incentive spirometer as instructed.  Prostate cancer ([place])- (present on admission) -History of prostate cancer and BPH -Continue treatment with Flomax and Proscar. -Reports no complaints of urinary retention currently. -Continue patient follow-up with urology service.  Hypothyroidism- (present on admission) -Continue Synthroid.  CAD (coronary artery disease)- (present on admission) -No complaining of chest pain -Continue treatment with Coreg, aspirin, Imdur and statin.   DM (diabetes mellitus), type 2 (HCC) -Continue sliding scale insulin -Follow CBGs and adjust hypoglycemic regimen as needed. -Discussed with patient importance of medication compliance and modify carbohydrate diet. -A1c 7.0 -Patient might experience elevated levels in his CBGs due to the use of his steroids.  HLD (hyperlipidemia)- (present on admission) -Continue statins -Heart healthy diet discussed with patient.  HTN (hypertension)- (present on admission) -Stable overall -Continue current antihypertensive regimen -Continue heart healthy diet.   Class I obesity -Body mass index is 34.44 kg/m. -Low calorie diet, portion control and increase physical activity discussed with patient.  Subjective:  Still having difficulty speaking in full sentences and complaining of intermittent coughing spells with short winded sensation with activity.  No requiring oxygen supplementation.  Patient is afebrile.  Physical  Exam: Vitals:   12/22/21 0728 12/22/21 0733 12/22/21 1405 12/22/21 1418  BP:    (!) 175/94  Pulse:    86  Resp:    20  Temp:    97.7 F (36.5 C)  TempSrc:    Oral  SpO2: 91% 91% 93% 91%  Weight:      Height:       General exam: Alert, awake, oriented x 3, reporting difficulty sleeping at night.  No chest pain, no nausea, no vomiting.  Still having difficulty speaking in full sentences and short winded with activity. Respiratory system: Fair air movement; diffuse rhonchi bilaterally.  Positive expiratory wheezing appreciated on exam.  No using accessory muscle. Cardiovascular system:RRR. No murmurs, rubs, gallops.  No JVD. Gastrointestinal system: Abdomen is nondistended, soft and nontender. No organomegaly or masses felt. Normal bowel sounds heard. Central nervous system: Alert and oriented. No focal neurological deficits. Extremities: No cyanosis or clubbing. Skin: No rashes, no petechiae. Psychiatry: Judgement and insight appear normal. Mood & affect appropriate.   Data Reviewed: CT angiogram demonstrated no acute pulmonary embolism; but was positive a small peribronchovascular consolidation in the central right lower lobe with surrounding scattered small groundglass nodules suggestive of pneumonia.  CBGs in the 1 8260 range due to the use of his steroids.  Family Communication: Friend at bedside.  Disposition: Status is: Inpatient Remains inpatient appropriate because: Continue treatment with the steroids, bronchodilators and IV antibiotics.  Patient is still short of breath with minimal activity and having difficulty speaking in full sentences.   Planned Discharge Destination: Home   Author: Barton Dubois, MD 12/22/2021 3:14 PM  For on call review [email or web address].

## 2021-12-23 DIAGNOSIS — J449 Chronic obstructive pulmonary disease, unspecified: Secondary | ICD-10-CM

## 2021-12-23 LAB — LEGIONELLA PNEUMOPHILA SEROGP 1 UR AG: L. pneumophila Serogp 1 Ur Ag: NEGATIVE

## 2021-12-23 LAB — BASIC METABOLIC PANEL
Anion gap: 7 (ref 5–15)
BUN: 22 mg/dL (ref 8–23)
CO2: 32 mmol/L (ref 22–32)
Calcium: 8.5 mg/dL — ABNORMAL LOW (ref 8.9–10.3)
Chloride: 102 mmol/L (ref 98–111)
Creatinine, Ser: 0.93 mg/dL (ref 0.61–1.24)
GFR, Estimated: >60 mL/min (ref >60)
Glucose, Bld: 119 mg/dL — ABNORMAL HIGH (ref 70–99)
Potassium: 3.2 mmol/L — ABNORMAL LOW (ref 3.5–5.1)
Sodium: 141 mmol/L (ref 135–145)

## 2021-12-23 LAB — CBC
HCT: 44.9 % (ref 39.0–52.0)
Hemoglobin: 13.9 g/dL (ref 13.0–17.0)
MCH: 26.8 pg (ref 26.0–34.0)
MCHC: 31 g/dL (ref 30.0–36.0)
MCV: 86.7 fL (ref 80.0–100.0)
Platelets: 192 10*3/uL (ref 150–400)
RBC: 5.18 MIL/uL (ref 4.22–5.81)
RDW: 13.9 % (ref 11.5–15.5)
WBC: 12.1 10*3/uL — ABNORMAL HIGH (ref 4.0–10.5)
nRBC: 0 % (ref 0.0–0.2)

## 2021-12-23 LAB — GLUCOSE, CAPILLARY
Glucose-Capillary: 130 mg/dL — ABNORMAL HIGH (ref 70–99)
Glucose-Capillary: 198 mg/dL — ABNORMAL HIGH (ref 70–99)

## 2021-12-23 MED ORDER — POTASSIUM CHLORIDE CRYS ER 20 MEQ PO TBCR
40.0000 meq | EXTENDED_RELEASE_TABLET | Freq: Once | ORAL | Status: AC
  Administered 2021-12-23: 40 meq via ORAL
  Filled 2021-12-23: qty 2

## 2021-12-23 MED ORDER — HYDROXYZINE HCL 10 MG PO TABS: 10.0000 mg | ORAL_TABLET | Freq: Three times a day (TID) | ORAL | 0 refills | Status: DC | PRN

## 2021-12-23 MED ORDER — ALBUTEROL SULFATE HFA 108 (90 BASE) MCG/ACT IN AERS: 1.0000 | INHALATION_SPRAY | Freq: Four times a day (QID) | RESPIRATORY_TRACT | 3 refills | Status: DC | PRN

## 2021-12-23 MED ORDER — PREDNISONE 20 MG PO TABS: ORAL_TABLET | ORAL | 0 refills | Status: DC

## 2021-12-23 MED ORDER — PANTOPRAZOLE SODIUM 40 MG PO TBEC: 40.0000 mg | DELAYED_RELEASE_TABLET | Freq: Two times a day (BID) | ORAL | 1 refills | Status: DC

## 2021-12-23 MED ORDER — DM-GUAIFENESIN ER 30-600 MG PO TB12: 1.0000 | ORAL_TABLET | Freq: Two times a day (BID) | ORAL | 0 refills | Status: DC

## 2021-12-23 MED ORDER — FLUTICASONE FUROATE-VILANTEROL 200-25 MCG/ACT IN AEPB
1.0000 | INHALATION_SPRAY | Freq: Every day | RESPIRATORY_TRACT | 2 refills | Status: DC
Start: 2021-12-23 — End: 2022-02-03

## 2021-12-23 MED ORDER — CEFDINIR 300 MG PO CAPS: 300.0000 mg | ORAL_CAPSULE | Freq: Two times a day (BID) | ORAL | 0 refills | Status: AC

## 2021-12-23 NOTE — Discharge Summary (Signed)
Physician Discharge Summary   Patient: Robert Brown MRN: [id number] DOB: 09-23-1951  Admit date:     12/20/2021  Discharge date: 12/23/21  Discharge Physician: Robert Brown   PCP: Robert Douglas, MD   Recommendations at discharge:  Repeat basic metabolic panel to follow-up lites renal function Patient patient follow-up with pulmonology as instructed Repeat chest x-ray in 6-8 weeks to assure complete resolution of infiltrates in his lungs.  Discharge Diagnoses: Principal Problem:   Acute respiratory failure with hypoxia (HCC) Active Problems:   HTN (hypertension)   HLD (hyperlipidemia)   DM (diabetes mellitus), type 2 (HCC)   CAD (coronary artery disease)   Hypothyroidism   Prostate cancer ([place])   COPD (chronic obstructive pulmonary disease) ([place])   CAP (community acquired pneumonia)   Brief admission narrative: As per H&P written by Dr. Waldron Brown on 12/20/2021  Robert Brown  is a 71 y.o. male, with a history of coronary artery disease, hypertension, diabetes, COPD, no oxygen requirement at baseline, hyperlipidemia, CAD, prostate cancer, hypothyroidism, 24 last November, report he never felt back to baseline after his COVID infection, patient presents to ED secondary to complaints of shortness of breath, cough and chest tightness, as well reports cough is productive with orange-colored sputum, he does reports chest pain worsening with dyspnea, denies any fever, chills, reports feeling fatigue.  Patient went to urgent care, as was found to be hypoxic, was started on 4 L oxygen and was sent to ED for further evaluation -in ED given his chest pain CTA was obtained, no PE, but significant for pneumonia, EKG nonacute, first troponin is negative, he is on 4 L nasal cannula upon my evaluation, dips to 86% once put on room air, nevic and wheezing which has improved with steroids and nebulizer treatment, given his hypoxia, pneumonia, [facility] hospitalist consulted to admit.  Assessment and  Plan: * Acute respiratory failure with hypoxia ([place])- (present on admission) -In the setting of community-acquired pneumonia and COPD exacerbation. -Continue treatment with steroids tapering, mucolytic's and bronchodilator management. -At discharge patient not requiring oxygen supplementation. -Continue the use of flutter valve and incentive spirometer has been requested. -Recommending outpatient follow-up with pulmonologist for PFTs and further adjustment on maintenance therapy for COPD.   CAP (community acquired pneumonia) -As mentioned above patient presenting community-acquired pneumonia -Continue antibiotic management -Patient is afebrile and not requiring oxygen supplementation. -Elevation in his WBCs in the presence of his steroid usage. -Repeat chest x-ray in 6-8 weeks to assure complete resolution of infiltrates.  COPD (chronic obstructive pulmonary disease) ([place])- (present on admission) -With acute exacerbation as mentioned above. -Continue treatment with steroids (tapering dose), mucolytic's and bronchodilator management. -Currently off oxygen and not requiring supplementation. -Encourage to use flutter valve and incentive spirometer as instructed. -Patient instructed to arrange outpatient follow-up with pulmonologist for PFTs and further adjustment to maintenance therapy. -Discharged on as needed albuterol, Breo Ellipta for maintenance and PPI therapy.  Prostate cancer ([place])- (present on admission) -History of prostate cancer and BPH -Continue treatment with Flomax and Proscar. -Reports no complaints of urinary retention currently. -Continue patient follow-up with urology service.  Hypothyroidism- (present on admission) -Continue Synthroid. -Continue to follow thyroid panel intermittently as an outpatient.  CAD (coronary artery disease)- (present on admission) -No complaining of chest pain -Continue treatment with Coreg, losartan, aspirin, Imdur and statin. -Continue  risk factor modifications and outpatient follow-up with cardiology service.   DM (diabetes mellitus), type 2 ([place]) -Patient receiving sliding scale insulin and long-acting basal insulin while inpatient. -CBGs overall well  controlled. -Resume home hypoglycemic regimen and follow modified, hydrate diet. -Some elevation on his CBGs will be anticipated while receiving steroid therapy. -Continue to follow blood glucose levels/A1c with further adjustment to hypoglycemic regimen as required.   -A1c 7.0 demonstrating good diabetic control.  HLD (hyperlipidemia)- (present on admission) -Continue statins -Heart healthy diet discussed with patient. -Continue to follow LFTs and lipid panel as an outpatient.  HTN (hypertension)- (present on admission) -Stable overall -Continue current antihypertensive regimen -Continue heart healthy diet. -Reassess blood pressure at follow-up visit with further adjustment of antihypertensive treatment as needed.  Class I obesity -Body mass index is 34.44 kg/m. -Low calorie diet, portion control and increase physical activity discussed with patient.    Consultants: None Procedures performed: See below for x-ray report. Disposition: Home Diet recommendation:  Cardiac and Carb modified diet  DISCHARGE MEDICATION: Allergies as of 12/23/2021       Reactions   Lisinopril    Medicines ending in -pril   Statins         Medication List     STOP taking these medications    amLODipine 10 MG tablet Commonly known as: NORVASC   chlorpheniramine-HYDROcodone 10-8 MG/5ML Suer Commonly known as: Tussionex Pennkinetic ER   guaiFENesin-dextromethorphan 100-10 MG/5ML syrup Commonly known as: ROBITUSSIN DM Replaced by: dextromethorphan-guaiFENesin 30-600 MG 12hr tablet       TAKE these medications    albuterol 108 (90 Base) MCG/ACT inhaler Commonly known as: VENTOLIN HFA Inhale 1-2 puffs into the lungs every 6 (six) hours as needed for shortness of  breath or wheezing.   aspirin 81 MG EC tablet Take 81 mg by mouth daily.   busPIRone 5 MG tablet Commonly known as: BUSPAR Take by mouth.   carvedilol 25 MG tablet Commonly known as: COREG Take 25 mg by mouth 2 (two) times daily.   cefdinir 300 MG capsule Commonly known as: OMNICEF Take 1 capsule (300 mg total) by mouth every 12 (twelve) hours for 5 days.   dextromethorphan-guaiFENesin 30-600 MG 12hr tablet Commonly known as: MUCINEX DM Take 1 tablet by mouth 2 (two) times daily. Replaces: guaiFENesin-dextromethorphan 100-10 MG/5ML syrup   ezetimibe 10 MG tablet Commonly known as: ZETIA Take 10 mg by mouth daily.   finasteride 5 MG tablet Commonly known as: PROSCAR Take 5 mg by mouth daily.   fluticasone furoate-vilanterol 200-25 MCG/ACT Aepb Commonly known as: Breo Ellipta Inhale 1 puff into the lungs daily.   glipiZIDE 5 MG 24 hr tablet Commonly known as: GLUCOTROL XL Take 5 mg by mouth daily.   hydrochlorothiazide 12.5 MG capsule Commonly known as: MICROZIDE Take 12.5 mg by mouth daily.   hydrOXYzine 10 MG tablet Commonly known as: ATARAX Take 1 tablet (10 mg total) by mouth every 8 (eight) hours as needed for anxiety.   isosorbide mononitrate 60 MG 24 hr tablet Commonly known as: IMDUR Take 60 mg by mouth 2 (two) times daily.   levothyroxine 88 MCG tablet Commonly known as: SYNTHROID Take 88 mcg by mouth every morning.   losartan 100 MG tablet Commonly known as: COZAAR Take 100 mg by mouth daily.   metFORMIN 500 MG 24 hr tablet Commonly known as: GLUCOPHAGE-XR Take 500 mg by mouth 2 (two) times daily.   nabumetone 750 MG tablet Commonly known as: RELAFEN Take 750 mg by mouth 2 (two) times daily.   nitroGLYCERIN 0.4 MG SL tablet Commonly known as: NITROSTAT Place under the tongue.   pantoprazole 40 MG tablet Commonly known as: Protonix Take 1  tablet (40 mg total) by mouth 2 (two) times daily.   predniSONE 20 MG tablet Commonly known as:  DELTASONE Take 3 tablets by mouth daily x2 days; then 2 tablets by mouth daily x2 days; then 1 tablet by mouth daily x3 days; then half tablet by mouth daily x3 days and stop prednisone. What changed:  medication strength how much to take how to take this when to take this additional instructions   rosuvastatin 5 MG tablet Commonly known as: CRESTOR Take 5 mg by mouth daily.   tamsulosin 0.4 MG Caps capsule Commonly known as: FLOMAX Take 0.4 mg by mouth 2 (two) times daily.        Follow-up Information     Robert Douglas, MD. Schedule an appointment as soon as possible for a visit in 10 day(s).   Specialties: Family Medicine, Sports Medicine Contact information: [address] [place] [address] [phone]         Tanda Rockers, MD. Schedule an appointment as soon as possible for a visit in 3 week(s).   Specialty: Pulmonary Disease Why: appointment to see him in [place] office. Contact information: [address] [place] [address] [phone]                 Discharge Exam: Danley Danker Weights   12/20/21 1410  Weight: 108.9 kg   General exam: Alert, awake, oriented x 3; afebrile, still complaining of intermittent coughing spells and mild sure when the sensation with activity.  No requiring oxygen supplementation unable to speak in full sentences at discharge. Respiratory system: Positive scattered rhonchi, mild expiratory wheezing; not using accessory muscles.  Good saturation on room air. Cardiovascular system:RRR. No murmurs, rubs, gallops.  No JVD. Gastrointestinal system: Abdomen is nondistended, soft and nontender. No organomegaly or masses felt. Normal bowel sounds heard. Central nervous system: Alert and oriented. No focal neurological deficits. Extremities: No cyanosis or clubbing. Skin: No petechiae. Psychiatry: Judgement and insight appear normal. Mood & affect appropriate.    Condition at discharge: improving  The  results of significant diagnostics from this hospitalization (including imaging, microbiology, ancillary and laboratory) are listed below for reference.   Imaging Studies: CT Angio Chest PE W/Cm &/Or Wo Cm  Result Date: 12/20/2021 CLINICAL DATA:  Cough and shortness of breath for the past week. EXAM: CT ANGIOGRAPHY CHEST WITH CONTRAST TECHNIQUE: Multidetector CT imaging of the chest was performed using the standard protocol during bolus administration of intravenous contrast. Multiplanar CT image reconstructions and MIPs were obtained to evaluate the vascular anatomy. RADIATION DOSE REDUCTION: This exam was performed according to the departmental dose-optimization program which includes automated exposure control, adjustment of the mA and/or kV according to patient size and/or use of iterative reconstruction technique. CONTRAST:  132mL OMNIPAQUE IOHEXOL 350 MG/ML SOLN COMPARISON:  Chest x-ray from same day. FINDINGS: Cardiovascular: Satisfactory opacification of the pulmonary arteries to the segmental level. No evidence of pulmonary embolism. Mild cardiomegaly. Trace pericardial fluid. Reflux of contrast into the IVC and hepatic veins. No thoracic aortic aneurysm or dissection. Coronary, aortic arch, and branch vessel atherosclerotic vascular disease. Mediastinum/Nodes: Mildly enlarged mediastinal and bilateral hilar lymph nodes measuring up to 1.4 cm in short axis. The thyroid gland, trachea, and esophagus demonstrate no significant findings. Lungs/Pleura: Prominent central peribronchial thickening. Small peribronchovascular consolidation in the central right lower lobe with surrounding scattered small ground-glass nodules. Volume loss, atelectasis/scarring, and small chronic cyst in the left lower lobe. No pleural effusion or pneumothorax. Upper Abdomen:  No acute abnormality. Musculoskeletal: No chest wall abnormality. No acute or significant osseous findings. Review of the MIP images confirms the above  findings. IMPRESSION: 1. No evidence of pulmonary embolism. 2. Small peribronchovascular consolidation in the central right lower lobe with surrounding scattered small ground-glass nodules, suggestive of pneumonia. 3. Mildly enlarged mediastinal and bilateral hilar lymph nodes, nonspecific, but likely reactive. 4. Mild cardiomegaly with reflux of contrast into the IVC and hepatic veins, suggestive of right heart dysfunction. 5. Aortic Atherosclerosis (ICD10-I70.0). Electronically Signed   By: Titus Dubin M.D.   On: 12/20/2021 15:48   DG Chest Portable 1 View  Result Date: 12/20/2021 CLINICAL DATA:  Shortness of breath, chest pain EXAM: PORTABLE CHEST 1 VIEW COMPARISON:  05/25/2021 FINDINGS: Transverse diameter of heart is increased. There are no signs of pulmonary edema or focal pulmonary consolidation. There is no pleural effusion or pneumothorax. IMPRESSION: Cardiomegaly. There are no signs of pulmonary edema or focal pulmonary consolidation. Electronically Signed   By: Elmer Picker M.D.   On: 12/20/2021 14:36    Microbiology: Results for orders placed or performed during the hospital encounter of 12/20/21  Resp Panel by RT-PCR (Flu A&B, Covid) Nasopharyngeal Swab     Status: None   Collection Time: 12/20/21  2:47 PM   Specimen: Nasopharyngeal Swab; Nasopharyngeal(NP) swabs in vial transport medium  Result Value Ref Range Status   SARS Coronavirus 2 by RT PCR NEGATIVE NEGATIVE Final    Comment: (NOTE) SARS-CoV-2 target nucleic acids are NOT DETECTED.  The SARS-CoV-2 RNA is generally detectable in upper respiratory specimens during the acute phase of infection. The lowest concentration of SARS-CoV-2 viral copies this assay can detect is 138 copies/mL. A negative result does not preclude SARS-Cov-2 infection and should not be used as the sole basis for treatment or other patient management decisions. A negative result may occur with  improper specimen collection/handling, submission  of specimen other than nasopharyngeal swab, presence of viral mutation(s) within the areas targeted by this assay, and inadequate number of viral copies(<138 copies/mL). A negative result must be combined with clinical observations, patient history, and epidemiological information. The expected result is Negative.  Fact Sheet for Patients:  EntrepreneurPulse.com.au  Fact Sheet for Healthcare Providers:  IncredibleEmployment.be  This test is no t yet approved or cleared by the [place] FDA and  has been authorized for detection and/or diagnosis of SARS-CoV-2 by FDA under an Emergency Use Authorization (EUA). This EUA will remain  in effect (meaning this test can be used) for the duration of the COVID-19 declaration under Section 564(b)(1) of the Act, 21 U.S.C.section 360bbb-3(b)(1), unless the authorization is terminated  or revoked sooner.       Influenza A by PCR NEGATIVE NEGATIVE Final   Influenza B by PCR NEGATIVE NEGATIVE Final    Comment: (NOTE) The Xpert Xpress SARS-CoV-2/FLU/RSV plus assay is intended as an aid in the diagnosis of influenza from Nasopharyngeal swab specimens and should not be used as a sole basis for treatment. Nasal washings and aspirates are unacceptable for Xpert Xpress SARS-CoV-2/FLU/RSV testing.  Fact Sheet for Patients: EntrepreneurPulse.com.au  Fact Sheet for Healthcare Providers: IncredibleEmployment.be  This test is not yet approved or cleared by the [place] FDA and has been authorized for detection and/or diagnosis of SARS-CoV-2 by FDA under an Emergency Use Authorization (EUA). This EUA will remain in effect (meaning this test can be used) for the duration of the COVID-19 declaration under Section 564(b)(1) of the Act, 21 U.S.C. section 360bbb-3(b)(1), unless the  authorization is terminated or revoked.  Performed at [facility], [address],  [place], [place] [address]     Brown: CBC: Recent Brown  Lab 12/20/21 1412 12/21/21 0442 12/23/21 0452  WBC 6.4 6.3 12.1*  NEUTROABS 3.2  --   --   HGB 14.4 13.3 13.9  HCT 44.1 42.1 44.9  MCV 88.4 89.0 86.7  PLT 158 168 937   Basic Metabolic Panel: Recent Brown  Lab 12/20/21 1412 12/21/21 0442 12/23/21 0452  NA 141 139 141  K 3.3* 3.6 3.2*  CL 101 101 102  CO2 30 29 32  GLUCOSE 139* 223* 119*  BUN 24* 22 22  CREATININE 1.16 0.93 0.93  CALCIUM 9.0 8.7* 8.5*   Liver Function Tests: Recent Brown  Lab 12/20/21 1412  AST 20  ALT 23  ALKPHOS 85  BILITOT 0.6  PROT 7.6  ALBUMIN 3.8   CBG: Recent Brown  Lab 12/22/21 1146 12/22/21 1646 12/22/21 2205 12/23/21 0754 12/23/21 1220  GLUCAP 260* 226* 208* 130* 198*    Discharge time spent: greater than 30 minutes.  Signed: Barton Dubois, MD [facility] 12/23/2021

## 2021-12-23 NOTE — Progress Notes (Signed)
Pt asleep and resting well, hadn't slept in a couple of days

## 2021-12-23 NOTE — Progress Notes (Signed)
IV removed and discharge instructions reviewed by Deeann Dowse .  Taken by [facility] to  entrance for ride home.

## 2021-12-23 NOTE — Progress Notes (Signed)
Inpatient Diabetes Program Recommendations  AACE/ADA: New Consensus Statement on Inpatient Glycemic Control (2015)  Target Ranges:  Prepandial:   less than 140 mg/dL      Peak postprandial:   less than 180 mg/dL (1-2 hours)      Critically ill patients:  140 - 180 mg/dL   Lab Results  Component Value Date   GLUCAP 130 (H) 12/23/2021   HGBA1C 7.0 (H) 12/20/2021    Review of Glycemic Control  Latest Reference Range & Units 12/22/21 16:46 12/22/21 22:05 12/23/21 07:54  Glucose-Capillary 70 - 99 mg/dL 226 (H) 208 (H) 130 (H)  (H): Data is abnormally high Diabetes history: Type 2 DM Outpatient Diabetes medications: Glipizide 5 mg QA, Metformin 500 mg BID Current orders for Inpatient glycemic control: Levemir 10 units QHS, Novolog 0-15 units TID & HS  Inpatient Diabetes Program Recommendations:    If steroids to continue and glucose trends continue to exceed 200's mg/dL postprandially, consider adding Novolog 3 units TID (assuming patient consuming >50% of meals).   Thanks, Bronson Curb, MSN, RNC-OB Diabetes Coordinator [phone] (8a-5p)

## 2022-01-15 ENCOUNTER — Encounter (INDEPENDENT_AMBULATORY_CARE_PROVIDER_SITE_OTHER): Payer: Self-pay | Admitting: *Deleted

## 2022-02-03 ENCOUNTER — Ambulatory Visit (HOSPITAL_COMMUNITY)
Admission: RE | Admit: 2022-02-03 | Discharge: 2022-02-03 | Disposition: A | Discharge status: Home / Self Care | Payer: Medicare HMO | Source: Ambulatory Visit | Attending: Internal Medicine | Admitting: Internal Medicine

## 2022-02-03 ENCOUNTER — Encounter: Payer: Self-pay | Admitting: Internal Medicine

## 2022-02-03 ENCOUNTER — Ambulatory Visit: Payer: Medicare HMO | Admitting: Internal Medicine

## 2022-02-03 ENCOUNTER — Other Ambulatory Visit: Payer: Self-pay

## 2022-02-03 VITALS — BP 144/86 | HR 60 | Temp 98.2°F | Ht 70.0 in | Wt 239.0 lb

## 2022-02-03 DIAGNOSIS — I208 Other forms of angina pectoris: Secondary | ICD-10-CM | POA: Diagnosis not present

## 2022-02-03 DIAGNOSIS — J449 Chronic obstructive pulmonary disease, unspecified: Secondary | ICD-10-CM | POA: Diagnosis present

## 2022-02-03 MED ORDER — TRELEGY ELLIPTA 100-62.5-25 MCG/ACT IN AEPB: 1.0000 | INHALATION_SPRAY | Freq: Every day | RESPIRATORY_TRACT | 0 refills | Status: DC

## 2022-02-03 NOTE — Progress Notes (Signed)
? ?Robert Brown, male    DOB: [birth year],   MRN: [id number] ? ? 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 ?  ? ?Admit date:     12/20/2021  ?Discharge date: 12/23/21  ?Discharge Physician: Barton Dubois  ?  ?  ?  ?Recommendations at discharge:  ?Repeat basic metabolic panel to follow-up lites renal function ?Patient patient follow-up with pulmonology as instructed ?Repeat chest x-ray in 6-8 weeks to assure complete resolution of infiltrates in his lungs. ?  ?Discharge Diagnoses: ?Principal Problem: ?  Acute respiratory failure with hypoxia ([place]) ?  HTN (hypertension) ?  HLD (hyperlipidemia) ?  DM (diabetes mellitus), type 2 ([place]) ?  CAD (coronary artery disease) ?  Hypothyroidism ?  Prostate cancer ([place]) ?  COPD (chronic obstructive pulmonary disease) ([place]) ?  CAP (community acquired pneumonia) ?  ?  ?Brief admission narrative: ?  ?  71 y.o. male, with a history of coronary artery disease, hypertension, diabetes, COPD, no oxygen requirement at baseline, hyperlipidemia, CAD, prostate cancer, hypothyroidism,  November, report he never felt back to baseline after his COVID infection, patient presents to ED secondary to complaints of shortness of breath, cough and chest tightness, as well reports cough is productive with orange-colored sputum, he does reports chest pain worsening with dyspnea, denies any fever, chills, reports feeling fatigue.  Patient went to urgent care, as was found to be hypoxic, was started on 4 L oxygen and was sent to ED for further evaluation ?-in ED given his chest pain CTA was obtained, no PE, but significant for pneumonia, EKG nonacute, first troponin is negative, he is on 4 L nasal cannula upon my evaluation, dips to 86% once put on room air, nevic and wheezing which has improved with steroids and nebulizer treatment,  given his hypoxia, pneumonia, [facility] consulted to admit. ?  ?Assessment and Plan: ?* Acute respiratory failure with hypoxia ([place])- (present on admission) ?-In the setting of community-acquired pneumonia and COPD exacerbation. ?-Continue treatment with steroids tapering, mucolytic's and bronchodilator management. ?-At discharge patient not requiring oxygen supplementation. ?-Continue the use of flutter valve and incentive spirometer has been requested. ?-Recommending outpatient follow-up with pulmonologist for PFTs and further adjustment on maintenance therapy for COPD. ?  ?  ?CAP (community acquired pneumonia) ?-As mentioned above patient presenting community-acquired pneumonia ?-Continue antibiotic management ?-Patient is afebrile and not requiring oxygen supplementation. ?-Elevation in his WBCs in the presence of his steroid usage. ?-Repeat chest x-ray in 6-8 weeks to assure complete resolution of infiltrates. ?  ?COPD (chronic obstructive pulmonary disease) ([place])- (present on admission) ?-With acute exacerbation as mentioned above. ?-Continue treatment with steroids (tapering dose), mucolytic's and bronchodilator management. ?-Currently off oxygen and not requiring supplementation. ?-Encourage to use flutter valve and incentive spirometer as instructed. ?-Patient instructed to arrange outpatient follow-up with pulmonologist for PFTs and further adjustment to maintenance therapy. ?-Discharged on as needed albuterol, Breo Ellipta for maintenance and PPI therapy. ?  ?Prostate cancer [facility])- (present on admission) ?-History of prostate cancer and BPH ?-Continue treatment with Flomax and Proscar. ?-Reports no complaints of urinary retention currently. ?-Continue patient follow-up with urology service. ?  ?Hypothyroidism- (present on admission) ?-Continue Synthroid. ?-Continue to follow thyroid panel intermittently as an outpatient. ?  ?CAD (coronary artery disease)- (present on admission) ?-No complaining of  chest pain ?-Continue  treatment with Coreg, losartan, aspirin, Imdur and statin. ?-Continue risk factor modifications and outpatient follow-up with cardiology service. ?  ?  ?DM (diabetes mellitus), type 2 ([place]) ?-Patient receiving sliding scale insulin and long-acting basal insulin while inpatient. ?-CBGs overall well controlled. ?-Resume home hypoglycemic regimen and follow modified, hydrate diet. ?-Some elevation on his CBGs will be anticipated while receiving steroid therapy. ?-Continue to follow blood glucose levels/A1c with further adjustment to hypoglycemic regimen as required.   ?-A1c 7.0 demonstrating good diabetic control. ?  ?HLD (hyperlipidemia)- (present on admission) ?-Continue statins ?-Heart healthy diet discussed with patient. ?-Continue to follow LFTs and lipid panel as an outpatient. ?  ?HTN (hypertension)- (present on admission) ?-Stable overall ?-Continue current antihypertensive regimen ?-Continue heart healthy diet. ?-Reassess blood pressure at follow-up visit with further adjustment of antihypertensive treatment as needed. ?  ?Class I obesity ?-Body mass index is 34.44 kg/m?. ?-Low calorie diet, portion control and increase physical activity discussed with patient. ?  ? ? ?History of Present Illness  ?02/03/2022  Pulmonary/ 1st office eval/ Melvyn Novas / [facility] / ?Chief Complaint  ?Patient presents with  ? Hospitalization Follow-up  ?  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 ?No obvious day to day or daytime variability or assoc excess/ purulent sputum or mucus plugs or hemoptysis or chest tightness, subjective wheeze or overt sinus or hb symptoms.  ? ?Sleeping as above  without nocturnal  or early am exacerbation  of respiratory  c/o's or need for noct saba. Also denies any obvious fluctuation of symptoms with weather or environmental changes or other aggravating or alleviating factors except as outlined above  ? ?No unusual exposure hx or h/o childhood pna/ asthma or knowledge of premature birth. ? ?Current Allergies, Complete Past Medical History, Past Surgical History, Family History, and Social History were reviewed in [employer] record. ? ?ROS  The following are not active complaints unless bolded ?Hoarseness, sore throat, dysphagia, dental problems, itching, sneezing,  nasal congestion or discharge of excess mucus or purulent secretions, ear ache,   fever, chills, sweats, unintended wt loss or wt gain, classically pleuritic or exertional cp,  orthopnea pnd or arm/hand swelling  or leg swelling, presyncope, palpitations, abdominal pain, anorexia, nausea, vomiting, diarrhea  or change in bowel habits or change in bladder habits, change in stools or change in urine, dysuria, hematuria,  rash, arthralgias, visual complaints, headache, numbness, weakness or ataxia or problems with walking or coordination,  change in mood or  memory. ?      ?   ? ?Past Medical History:  ?Diagnosis Date  ? CAD (coronary artery disease)   ? Cancer [facility])   ? Diabetes mellitus without complication ([place])   ? Hypertension   ? Stroke [facility])   ? ? ?Outpatient Medications Prior to Visit - - NOTE:   Unable  to verify as accurately reflecting what pt takes    ?Medication Sig Dispense Refill  ? albuterol (VENTOLIN HFA) 108 (90 Base) MCG/ACT inhaler Inhale 1-2 puffs into the lungs every 6 (six) hours as needed for shortness of breath or  wheezing. 8 g 3  ? aspirin 81 MG EC tablet Take 81 mg by mouth daily.    ? busPIRone (BUSPAR) 5 MG tablet Take by mouth.    ? carvedilol (COREG) 25 MG tablet Take 25 mg by mouth 2 (two) times daily.    ? ez

## 2022-02-03 NOTE — Patient Instructions (Signed)
Stop BREO and start Trelegy one click each am  ? ?Only use your albuterol as a rescue medication to be used if you can't catch your breath by resting or doing a relaxed purse lip breathing pattern.  ?- The less you use it, the better it will work when you need it. ?- Ok to use up to 2 puffs  every 4 hours if you must but call for immediate appointment if use goes up over your usual need ?- Don't leave home without it !!  (think of it like the spare tire for your car)  ? ?Ok to try albuterol 15 min (try one puff then next day two) before an activity (on alternating days)  that you know would usually make you short of breath and see if it makes any difference and if makes none then don't take albuterol after activity unless you can't catch your breath as this means it's the resting that helps, not the albuterol. ?    ? ?Please remember to go to the  x-ray department  @  [facility] for your tests - we will call you with the results when they are available    ? ? ?We will be referring you to the heart doctors at [facility] for stable angina > to ER if worsens ? ? ?Please schedule a follow up office visit in 4 weeks, sooner if needed  ?

## 2022-02-04 ENCOUNTER — Encounter: Payer: Self-pay | Admitting: Internal Medicine

## 2022-02-04 NOTE — Assessment & Plan Note (Addendum)
Quit smoking 2008 and symptoms started p covid July 2022  ?-  02/03/2022   Walked on RA  x  3  lap(s) =  approx 450  ft  @ slow pace, stopped due to sob  with lowest 02 sats 91%  ? ? ?When respiratory symptoms begin or become refractory well after a patient reports complete smoking cessation,  Especially when this wasn't the case while they were smoking, a red flag is raised based on the work of Dr Kris Mouton which states: if you quit smoking when your best day FEV1 is still well preserved it is highly unlikely you will progress to severe disease. ? ?That is to say, once the smoking stops,  the symptoms should not suddenly erupt or markedly worsen.  If so, the differential diagnosis should include  obesity/deconditioning,  LPR/Reflux/Aspiration syndromes,  occult CHF or IHD, or  especially side effect of medications commonly used in this population.   ? ?He appears to have stable chronic angina but needs a second opinion re how to manage this problem closer to home so referred him to cards in [place] and in meantime  Group D in terms of symptom/risk and laba/lama/ICS  therefore appropriate rx at this point >>>  trelegy samples x 4 weeks then return. ? ?Will hold off on pfts due to his angina for now and rx empirically as above plus cautious use of saba: ? ?Re SABA :  I spent extra time with pt today reviewing appropriate use of albuterol for prn use on exertion with the following points: ?1) saba is for relief of sob that does not improve by walking a slower pace or resting but rather if the pt does not improve after trying this first. ?2) If the pt is convinced, as many are, that saba helps recover from activity faster then it's easy to tell if this is the case by re-challenging : ie stop, take the inhaler, then p 5 minutes try the exact same activity (intensity of workload) that just caused the symptoms and see if they are substantially diminished or not after saba ?3) if there is an activity that reproducibly  causes the symptoms, try the saba 15 min before the activity on alternate days  ? ?If in fact the saba really does help, then fine to continue to use it prn but advised may need to look closer at the maintenance regimen being used to achieve better control of airways disease with exertion.  ? ? ?Each maintenance medication was reviewed in detail including emphasizing most importantly the difference between maintenance and prns and under what circumstances the prns are to be triggered using an action plan format where appropriate. ? ?Total time for H and P, chart review, counseling, reviewing when/how to use  hfa and dpi(elipita) device(s) , directly observing portions of ambulatory 02 saturation study/ and generating customized AVS unique to this initial office visit / same day charting  > 45  min  ?     ?  ?      ? ?  ?

## 2022-02-14 ENCOUNTER — Ambulatory Visit: Payer: Medicare HMO | Admitting: Internal Medicine

## 2022-02-14 NOTE — Progress Notes (Deleted)
?Cardiology Office Note:   ? ?Date:  02/14/2022  ? ?ID:  Robert Brown, DOB Aug 22, 1951, MRN [id number] ? ?PCP:  Leonie Douglas, MD ?  ?[facility] Providers ?Cardiologist:  None { ?Click to update primary MD,subspecialty MD or APP then REFRESH:1}   ? ?Referring MD: Tanda Rockers, MD  ? ?No chief complaint on file. ?*** ? ?History of Present Illness:   ? ?Robert Brown is a 71 y.o. male with a hx of CAD with known LAD and RCA with collaterals last cath in 4/322/22, HTN with DM, OSA, Prior stroke.  Recent COPD exacerbation 02/03/22.  Also has prostate CA. ? ?Patient notes that he is feeling ***.  Has had no chest pain, chest pressure, chest tightness, chest stinging ***.  Discomfort occurs with ***, worsens with ***, and improves with ***.  Patient exertion notable for *** with *** and feels no symptoms.  No shortness of breath, DOE ***.  No PND or orthopnea***.  No weight gain***, leg swelling ***, or abdominal swelling***.  No syncope or near syncope ***. Notes *** no palpitations or funny heart beats.    ? ?Patient reports prior cardiac testing including *** ? ?No history of ***pre-eclampsia, gestation HTN or gestational DM.  No Fen-Phen or drug use***. ? ?Ambulatory BP ***. ? ? ?Past Medical History:  ?Diagnosis Date  ? CAD (coronary artery disease)   ? Cancer [facility])   ? Diabetes mellitus without complication ([place])   ? Hypertension   ? Stroke [facility])   ? ? ?Past Surgical History:  ?Procedure Laterality Date  ? bone grafts    ? TUMOR EXCISION    ? ? ?Current Medications: ?No outpatient medications have been marked as taking for the 02/14/22 encounter (Appointment) with Werner Lean, MD.  ?  ? ?Allergies:   Lisinopril and Statins  ? ?Social History  ? ?Socioeconomic History  ? Marital status: Married  ?  Spouse name: Not on file  ? Number of children: Not on file  ? Years of education: Not on file  ? Highest education level: Not on file  ?Occupational History  ? Not on file  ?Tobacco Use  ? Smoking status:  Former  ?  Types: Cigarettes  ?  Quit date: 11/17/2006  ?  Years since quitting: 15.2  ? Smokeless tobacco: Never  ?Vaping Use  ? Vaping Use: Never used  ?Substance and Sexual Activity  ? Alcohol use: Not Currently  ?  Comment: former drinker  ? Drug use: Never  ? Sexual activity: Not Currently  ?Other Topics Concern  ? Not on file  ?Social History Narrative  ? Not on file  ? ?Social Determinants of Health  ? ?Financial Resource Strain: Not on file  ?Food Insecurity: Not on file  ?Transportation Needs: Not on file  ?Physical Activity: Not on file  ?Stress: Not on file  ?Social Connections: Not on file  ?  ? ?Family History: ?The patient's ***family history includes Diabetes in his father and mother. ? ?ROS:   ?Please see the history of present illness.    ?*** All other systems reviewed and are negative. ? ?EKGs/Labs/Other Studies Reviewed:   ? ?The following studies were reviewed today: ?*** ? ?EKG:  EKG is *** ordered today.  The ekg ordered today demonstrates *** ? ?Recent Labs: ?05/27/2021: Magnesium 2.4 ?12/20/2021: ALT 23; B Natriuretic Peptide 24.0 ?12/23/2021: BUN 22; Creatinine, Ser 0.93; Hemoglobin 13.9; Platelets 192; Potassium 3.2; Sodium 141  ?Recent Lipid Panel ?No results found for: CHOL,  TRIG, HDL, CHOLHDL, VLDL, LDLCALC, LDLDIRECT ? ? ?Risk Assessment/Calculations:   ?{Does this patient have ATRIAL FIBRILLATION?:[id number]} ? ?    ? ?Physical Exam:   ? ?VS:  There were no vitals taken for this visit.   ? ?Wt Readings from Last 3 Encounters:  ?02/03/22 239 lb (108.4 kg)  ?12/20/21 240 lb (108.9 kg)  ?12/20/21 250 lb (113.4 kg)  ?  ? ?GEN: *** Well nourished, well developed in no acute distress ?HEENT: Normal ?NECK: No JVD; No carotid bruits ?LYMPHATICS: No lymphadenopathy ?CARDIAC: ***RRR, no murmurs, rubs, gallops ?RESPIRATORY:  Clear to auscultation without rales, wheezing or rhonchi  ?ABDOMEN: Soft, non-tender, non-distended ?MUSCULOSKELETAL:  No edema; No deformity  ?SKIN: Warm and dry ?NEUROLOGIC:   Alert and oriented x 3 ?PSYCHIATRIC:  Normal affect  ? ?ASSESSMENT:   ? ?No diagnosis found. ?PLAN:   ? ?In order of problems listed above: ? ?*** ? ?   ? ?{Are you ordering a CV Procedure (e.g. stress test, cath, DCCV, TEE, etc)?   Press F2        :[id number]}  ? ? ?Medication Adjustments/Labs and Tests Ordered: ?Current medicines are reviewed at length with the patient today.  Concerns regarding medicines are outlined above.  ?No orders of the defined types were placed in this encounter. ? ?No orders of the defined types were placed in this encounter. ? ? ?There are no Patient Instructions on file for this visit.  ? ?Signed, ?Werner Lean, MD  ?02/14/2022 1:00 PM    ?[place] ? ?

## 2022-03-03 ENCOUNTER — Ambulatory Visit: Payer: Medicare HMO | Admitting: Internal Medicine

## 2022-03-03 NOTE — Progress Notes (Deleted)
? ?Robert Brown, male    DOB: 11/08/1951,   MRN: [id number] ? ? ?Brief patient profile:  ?70   yowm quit smoking 2008 with onset of ? 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 ?  ? ?Admit date:     12/20/2021  ?Discharge date: 12/23/21  ?Discharge Physician: Barton Dubois  ?  ?  ?  ?Recommendations at discharge:  ?Repeat basic metabolic panel to follow-up lites renal function ?Patient patient follow-up with pulmonology as instructed ?Repeat chest x-ray in 6-8 weeks to assure complete resolution of infiltrates in his lungs. ?  ?Discharge Diagnoses: ?Principal Problem: ?  Acute respiratory failure with hypoxia ([place]) ?  HTN (hypertension) ?  HLD (hyperlipidemia) ?  DM (diabetes mellitus), type 2 ([place]) ?  CAD (coronary artery disease) ?  Hypothyroidism ?  Prostate cancer ([place]) ?  COPD (chronic obstructive pulmonary disease) ([place]) ?  CAP (community acquired pneumonia) ?  ?  ?Brief admission narrative: ?  ?  71 y.o. male, with a history of coronary artery disease, hypertension, diabetes, COPD, no oxygen requirement at baseline, hyperlipidemia, CAD, prostate cancer, hypothyroidism,  November, report he never felt back to baseline after his COVID infection, patient presents to ED secondary to complaints of shortness of breath, cough and chest tightness, as well reports cough is productive with orange-colored sputum, he does reports chest pain worsening with dyspnea, denies any fever, chills, reports feeling fatigue.  Patient went to urgent care, as was found to be hypoxic, was started on 4 L oxygen and was sent to ED for further evaluation ?-in ED given his chest pain CTA was obtained, no PE, but significant for pneumonia, EKG nonacute, first troponin is negative, he is on 4 L nasal cannula upon my evaluation, dips to 86% once put on room air, nevic and wheezing which has improved with steroids and nebulizer treatment,  given his hypoxia, pneumonia, [facility]ist consulted to admit. ?  ?Assessment and Plan: ?* Acute respiratory failure with hypoxia ([place])- (present on admission) ?-In the setting of community-acquired pneumonia and COPD exacerbation. ?-Continue treatment with steroids tapering, mucolytic's and bronchodilator management. ?-At discharge patient not requiring oxygen supplementation. ?-Continue the use of flutter valve and incentive spirometer has been requested. ?-Recommending outpatient follow-up with pulmonologist for PFTs and further adjustment on maintenance therapy for COPD. ?  ?  ?CAP (community acquired pneumonia) ?-As mentioned above patient presenting community-acquired pneumonia ?-Continue antibiotic management ?-Patient is afebrile and not requiring oxygen supplementation. ?-Elevation in his WBCs in the presence of his steroid usage. ?-Repeat chest x-ray in 6-8 weeks to assure complete resolution of infiltrates. ?  ?COPD (chronic obstructive pulmonary disease) ([place])- (present on admission) ?-With acute exacerbation as mentioned above. ?-Continue treatment with steroids (tapering dose), mucolytic's and bronchodilator management. ?-Currently off oxygen and not requiring supplementation. ?-Encourage to use flutter valve and incentive spirometer as instructed. ?-Patient instructed to arrange outpatient follow-up with pulmonologist for PFTs and further adjustment to maintenance therapy. ?-Discharged on as needed albuterol, Breo Ellipta for maintenance and PPI therapy. ?  ?Prostate cancer [facility])- (present on admission) ?-History of prostate cancer and BPH ?-Continue treatment with Flomax and Proscar. ?-Reports no complaints of urinary retention currently. ?-Continue patient follow-up with urology service. ?  ?Hypothyroidism- (present on admission) ?-Continue Synthroid. ?-Continue to follow thyroid panel intermittently as an outpatient. ?  ?CAD (coronary artery disease)- (present on admission) ?-No complaining of  chest pain ?-Continue  treatment with Coreg, losartan, aspirin, Imdur and statin. ?-Continue risk factor modifications and outpatient follow-up with cardiology service. ?  ?  ?DM (diabetes mellitus), type 2 ([place]) ?-Patient receiving sliding scale insulin and long-acting basal insulin while inpatient. ?-CBGs overall well controlled. ?-Resume home hypoglycemic regimen and follow modified, hydrate diet. ?-Some elevation on his CBGs will be anticipated while receiving steroid therapy. ?-Continue to follow blood glucose levels/A1c with further adjustment to hypoglycemic regimen as required.   ?-A1c 7.0 demonstrating good diabetic control. ?  ?HLD (hyperlipidemia)- (present on admission) ?-Continue statins ?-Heart healthy diet discussed with patient. ?-Continue to follow LFTs and lipid panel as an outpatient. ?  ?HTN (hypertension)- (present on admission) ?-Stable overall ?-Continue current antihypertensive regimen ?-Continue heart healthy diet. ?-Reassess blood pressure at follow-up visit with further adjustment of antihypertensive treatment as needed. ?  ?Class I obesity ?-Body mass index is 34.44 kg/m?. ?-Low calorie diet, portion control and increase physical activity discussed with patient. ?  ? ? ?History of Present Illness  ?02/03/2022  Pulmonary/ 1st office eval/ Robert Brown / [facility] / ?Chief Complaint  ?Patient presents with  ? Hospitalization Follow-up  ?  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 but has gained 60 lbs since covid  ?Limited by cp from walking to MB x 50 ft  ?Dyspnea:  still walking to MB but it's gradually more difficult since d/c with additional concern doe and not checking sats   ?Cough: none  ?Sleep: cpap sleeps flat 2 pillow ?SABA use: once in last few  day, 3 h prior to OV  and not using breo correctly  ?Rec ?Stop BREO and start Trelegy one click each am  ?Only use your albuterol as a rescue medication  ?Ok to try albuterol 15 min (try one puff then next day two) before an activity  ?We will be referring you to the heart doctors at [facility] for stable angina > to ER if worsens ?Please schedule a follow up office visit in 4 weeks, sooner if needed  ? ? ? ?03/03/2022  f/u ov/[place] office/Rheda Kassab re: *** maint on ***  ?No chief complaint on file. ?  ?Dyspnea:  *** ?Cough: *** ?Sleeping: *** ?SABA use: *** ?02: *** ?Covid status: *** ?Lung cancer screening: *** ? ? ?No obvious day to day or daytime variability or assoc excess/ purulent sputum or mucus plugs or hemoptysis or cp or chest tightness, subjective wheeze or overt sinus or hb symptoms.  ? ?*** without nocturnal  or early am exacerbation  of respiratory  c/o's or need for noct saba. Also denies any obvious fluctuation of symptoms with weather or environmental changes or other aggravating or alleviating factors except as outlined above  ? ?No unusual exposure hx or h/o childhood pna/ asthma or knowledge of premature birth. ? ?Current Allergies, Complete Past Medical History, Past Surgical History, Family History, and Social History were reviewed in [employer] record. ? ?ROS  The following are not active complaints unless bolded ?Hoarseness, sore throat, dysphagia, dental problems, itching, sneezing,  nasal congestion or discharge of excess mucus or purulent secretions, ear ache,   fever, chills, sweats, unintended wt loss or wt gain, classically pleuritic or exertional cp,  orthopnea pnd or arm/hand swelling  or leg swelling,  presyncope, palpitations, abdominal pain, anorexia, nausea, vomiting, diarrhea  or change in bowel habits or change in bladder habits, change in stools or change in urine, dysuria, hematuria,  rash, arthralgias, visual complaints, headache, numbness, weakness or ataxia  or problems with walking or coordination,  change in mood or  memory. ?      ? ?No outpatient medications have been marked as taking for the 03/03/22 encounter (Appointment) with Tanda Rockers, MD.  ?

## 2022-04-03 ENCOUNTER — Ambulatory Visit: Payer: Medicare HMO | Admitting: Internal Medicine

## 2022-04-03 NOTE — Progress Notes (Deleted)
Robert Brown, male    DOB: 1951/09/06,   MRN: [id number]   Brief patient profile:  70   yowm quit smoking 2008 with onset of ? 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   Discharge Diagnoses: Principal Problem:   Acute respiratory failure with hypoxia (HCC)   HTN (hypertension)   HLD (hyperlipidemia)   DM (diabetes mellitus), type 2 (HCC)   CAD (coronary artery disease)   Hypothyroidism   Prostate cancer ([place])   COPD (chronic obstructive pulmonary disease) ([place])   CAP (community acquired pneumonia)     Brief admission narrative:     71 y.o. male, with a history of coronary artery disease, hypertension, diabetes, COPD, no oxygen requirement at baseline, hyperlipidemia, CAD, prostate cancer, hypothyroidism,  November, report he never felt back to baseline after his COVID infection, patient presents to ED secondary to complaints of shortness of breath, cough and chest tightness, as well reports cough is productive with orange-colored sputum, he does reports chest pain worsening with dyspnea, denies any fever, chills, reports feeling fatigue.  Patient went to urgent care, as was found to be hypoxic, was started on 4 L oxygen and was sent to ED for further evaluation -in ED given his chest pain CTA was obtained, no PE, but significant for pneumonia, EKG nonacute, first troponin is negative, he is on 4 L nasal cannula upon my evaluation, dips to 86% once put on room air, nevic and wheezing which has improved with steroids and nebulizer treatment,  given his hypoxia, pneumonia, [facility] hospitalist consulted to admit.   Assessment and Plan: * Acute respiratory failure with hypoxia ([place])- (present on admission) -In the setting of community-acquired pneumonia and COPD exacerbation. -Continue treatment with steroids tapering, mucolytic's and bronchodilator management. -At discharge patient not requiring oxygen supplementation. -Continue the use of flutter valve and incentive spirometer has been requested. -Recommending outpatient follow-up with pulmonologist for PFTs and further adjustment on maintenance therapy for COPD.     CAP (community acquired pneumonia) -As mentioned above patient presenting community-acquired pneumonia -Continue antibiotic management -Patient is afebrile and not requiring oxygen supplementation. -Elevation in his WBCs in the presence of his steroid usage. -Repeat chest x-ray in 6-8 weeks to assure complete resolution of infiltrates.   COPD (chronic obstructive pulmonary disease) ([place])- (present on admission) -With acute exacerbation as mentioned above. -Continue treatment with steroids (tapering dose), mucolytic's and bronchodilator management. -Currently off oxygen and not requiring supplementation. -Encourage to use flutter valve and incentive spirometer as instructed. -Patient instructed to arrange outpatient follow-up with pulmonologist for PFTs and further adjustment to maintenance therapy. -Discharged on as needed albuterol, Breo Ellipta for maintenance and PPI therapy.   Prostate cancer ([place])- (present on admission) -History of prostate cancer and BPH -Continue treatment with Flomax and Proscar. -Reports no complaints of urinary retention currently. -Continue patient follow-up with urology service.   Hypothyroidism- (present on admission) -Continue Synthroid. -Continue to follow thyroid panel intermittently as an outpatient.   CAD (coronary artery disease)- (present on admission) -No complaining of  chest pain -Continue  treatment with Coreg, losartan, aspirin, Imdur and statin. -Continue risk factor modifications and outpatient follow-up with cardiology service.     DM (diabetes mellitus), type 2 ([place]) -Patient receiving sliding scale insulin and long-acting basal insulin while inpatient. -CBGs overall well controlled. -Resume home hypoglycemic regimen and follow modified, hydrate diet. -Some elevation on his CBGs will be anticipated while receiving steroid therapy. -Continue to follow blood glucose levels/A1c with further adjustment to hypoglycemic regimen as required.   -A1c 7.0 demonstrating good diabetic control.   HLD (hyperlipidemia)- (present on admission) -Continue statins -Heart healthy diet discussed with patient. -Continue to follow LFTs and lipid panel as an outpatient.   HTN (hypertension)- (present on admission) -Stable overall -Continue current antihypertensive regimen -Continue heart healthy diet. -Reassess blood pressure at follow-up visit with further adjustment of antihypertensive treatment as needed.   Class I obesity -Body mass index is 34.44 kg/m. -Low calorie diet, portion control and increase physical activity discussed with patient.     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  Limited by cp from walking to MB x 50 ft  Dyspnea:  still walking to MB but it's gradually more difficult since d/c with additional concern doe and not checking sats   Cough: none  Sleep: cpap sleeps flat 2 pillow SABA use: once in last few  day, 3 h prior to OV  and not using breo correctly  Rec Stop BREO and start Trelegy one click each am  Only use your albuterol as a rescue medication Ok to try albuterol 15 min (try one puff then next day two) before an activity (on alternating days)  that you know would usually make you short of breath   We will be referring you to the heart doctors at [facility] for stable angina > to ER if worsens    04/03/2022  f/u ov/[place] office/Ivalene Platte re: *** maint on ***  No chief complaint on file.   Dyspnea:  *** Cough: *** Sleeping: *** SABA use: *** 02: *** Covid status: *** Lung cancer screening: ***   No obvious day to day or daytime variability or assoc excess/ purulent sputum or mucus plugs or hemoptysis or cp or chest tightness, subjective wheeze or overt sinus or hb symptoms.   *** without nocturnal  or early am exacerbation  of respiratory  c/o's or need for noct saba. Also denies any obvious fluctuation of symptoms with weather or environmental changes or other aggravating or alleviating factors except as outlined above   No unusual exposure hx or h/o childhood pna/ asthma or knowledge of premature birth.  Current Allergies, Complete Past Medical History, Past Surgical History, Family History, and Social History were reviewed in [employer] record.  ROS  The following are not active complaints unless bolded Hoarseness, sore throat, dysphagia, dental problems, itching, sneezing,  nasal congestion or discharge of excess mucus or purulent secretions, ear ache,   fever, chills, sweats, unintended wt loss or wt gain, classically pleuritic or exertional cp,  orthopnea pnd or arm/hand swelling  or leg swelling,  presyncope, palpitations, abdominal pain, anorexia, nausea, vomiting, diarrhea  or change in bowel habits or change in bladder habits, change in stools or change in urine, dysuria, hematuria,  rash, arthralgias, visual complaints, headache, numbness, weakness or  ataxia or problems with walking or coordination,  change in mood or  memory.        No outpatient medications have been marked as taking for the 04/03/22 encounter (Appointment) with Tanda Rockers, MD.                 Past Medical History:  Diagnosis Date   CAD (coronary artery disease)    Cancer ([place])    Diabetes mellitus without complication ([place])    Hypertension    Stroke [facility])        Objective:      Wt Readings from Last 3 Encounters:  02/03/22 239 lb (108.4 kg)  12/20/21 240 lb (108.9 kg)  12/20/21 250 lb (113.4 kg)      Vital signs reviewed  04/03/2022  - Note at rest 02 sats  ***% on ***   General appearance:    ***      Mild bar***             Assessment

## 2022-04-17 NOTE — Progress Notes (Signed)
CARDIOLOGY CONSULT NOTE       Patient ID: Robert Brown MRN: [id number] DOB/AGE: [birth year] 71 y.o.  Admit date: (Not on file) Referring Physician: Barton Dubois Primary Physician: Leonie Douglas, MD Primary Cardiologist: New Reason for Consultation: Angina  Active Problems:   * No active hospital problems. *   HPI:  71 y.o. referred by Dr Dyann Kief for CAD.  He was hospitalized for pneumonia 12/23/21 on top of baseline COPD.  He has history of DM with A1c 7 , HTN, Obesity and HLD He takes porscar and flomax for BPH After antibiotic Rx d/c with albuterol, Breo and PPI Rx PMH indicates history of stroke and CAD but no details He is a non smoker Progress notes indicate SSCP from coughing and pneumonia with no acute ECG changes and negative troponin He was on ASA, statin, imdur, ACE and imdur  ? Stroke in 2018 found notes indicating cerebellum, basal ganglia and frontal lobe infarcts MRA with occluded left vertebral but normal carotids vs normal variant No mention of w/u for embolic source  Seen by Dr Curly Rim cardiology in [place] 2014   Seen by Dr Melvyn Novas 02/03/22 and improved not having SSCP outside of COPD flair   Patient was late for appointment and very belligerent I tried to find out what his past heart history was and he said I should know how to do my job I explained to him that during his recent hospitalization there appeared to be mostly lung issues and not cardiac and I could not find out any other records in [place]   He was very obtuse and said he had a heart problem since the 80;s and had a heart cath then Then he said he was seen at [facility] and had a heart cath last year He then indicated I should  Have known he has had two heart attacks before I was finally able to find records from [place] with a Dr Margarito Liner who indicated that patient had cath 2022 with distal RCA/LCX occlusion and stable collaterals from cath done in 2020 He was on [facility] and could not affort  Ranexa   The patient refused to be seen after this conversation and left the clinic  He was actually somewhat hostile and security was called .   ROS All other systems reviewed and negative except as noted above  Past Medical History:  Diagnosis Date   CAD (coronary artery disease)    Cancer ([place])    Diabetes mellitus without complication ([place])    Hypertension    Stroke ([place])     Family History  Problem Relation Age of Onset   Diabetes Mother    Diabetes Father     Social History   Socioeconomic History   Marital status: Married    Spouse name: Not on file   Number of children: Not on file   Years of education: Not on file   Highest education level: Not on file  Occupational History   Not on file  Tobacco Use   Smoking status: Former    Types: Cigarettes    Quit date: 11/17/2006    Years since quitting: 15.4   Smokeless tobacco: Never  Vaping Use   Vaping Use: Never used  Substance and Sexual Activity   Alcohol use: Not Currently    Comment: former drinker   Drug use: Never   Sexual activity: Not Currently  Other Topics Concern   Not on file  Social History Narrative   Not on  file   Social Determinants of Health   Financial Resource Strain: Not on file  Food Insecurity: Not on file  Transportation Needs: Not on file  Physical Activity: Not on file  Stress: Not on file  Social Connections: Not on file  Intimate Partner Violence: Not on file    Past Surgical History:  Procedure Laterality Date   bone grafts     TUMOR EXCISION        Current Outpatient Medications:    albuterol (VENTOLIN HFA) 108 (90 Base) MCG/ACT inhaler, Inhale 1-2 puffs into the lungs every 6 (six) hours as needed for shortness of breath or wheezing., Disp: 8 g, Rfl: 3   aspirin EC 325 MG tablet, Take 325 mg by mouth daily., Disp: , Rfl:    busPIRone (BUSPAR) 5 MG tablet, Take by mouth., Disp: , Rfl:    carvedilol (COREG) 25 MG tablet, Take 25 mg by mouth 2 (two) times daily., Disp:  , Rfl:    ezetimibe (ZETIA) 10 MG tablet, Take 10 mg by mouth daily., Disp: , Rfl:    finasteride (PROSCAR) 5 MG tablet, Take 5 mg by mouth daily., Disp: , Rfl:    Fluticasone-Umeclidin-Vilant (TRELEGY ELLIPTA) 100-62.5-25 MCG/ACT AEPB, Inhale 1 puff into the lungs daily., Disp: 60 each, Rfl: 0   glipiZIDE (GLUCOTROL XL) 5 MG 24 hr tablet, Take 5 mg by mouth daily., Disp: , Rfl:    hydrOXYzine (ATARAX) 10 MG tablet, Take 1 tablet (10 mg total) by mouth every 8 (eight) hours as needed for anxiety., Disp: 30 tablet, Rfl: 0   isosorbide mononitrate (IMDUR) 60 MG 24 hr tablet, Take 60 mg by mouth 2 (two) times daily., Disp: , Rfl:    levothyroxine (SYNTHROID) 88 MCG tablet, Take 88 mcg by mouth every morning., Disp: , Rfl:    losartan-hydrochlorothiazide (HYZAAR) 100-12.5 MG tablet, Take 1 tablet by mouth daily., Disp: , Rfl:    metFORMIN (GLUCOPHAGE-XR) 500 MG 24 hr tablet, Take 500 mg by mouth 2 (two) times daily., Disp: , Rfl:    nabumetone (RELAFEN) 750 MG tablet, Take 750 mg by mouth 2 (two) times daily., Disp: , Rfl:    nitroGLYCERIN (NITROSTAT) 0.4 MG SL tablet, Place under the tongue., Disp: , Rfl:    pantoprazole (PROTONIX) 40 MG tablet, Take 1 tablet (40 mg total) by mouth 2 (two) times daily., Disp: 60 tablet, Rfl: 1   rosuvastatin (CRESTOR) 5 MG tablet, Take 5 mg by mouth daily., Disp: , Rfl:    tamsulosin (FLOMAX) 0.4 MG CAPS capsule, Take 0.4 mg by mouth 2 (two) times daily., Disp: , Rfl:     Physical Exam: Blood pressure (!) 178/100, pulse 72, height '5\' 10"'$  (1.778 m), weight 237 lb (107.5 kg), SpO2 95 %.    Affect appropriate Chronically ill male  HEENT: normal Neck supple with no adenopathy JVP normal no bruits no thyromegaly Lungs clear with no wheezing and good diaphragmatic motion Heart:  S1/S2 no murmur, no rub, gallop or click PMI normal Abdomen: benighn, BS positve, no tenderness, no AAA no bruit.  No HSM or HJR Distal pulses intact with no bruits No edema Neuro  non-focal Skin warm and dry No muscular weakness   Labs:   Lab Results  Component Value Date   WBC 12.1 (H) 12/23/2021   HGB 13.9 12/23/2021   HCT 44.9 12/23/2021   MCV 86.7 12/23/2021   PLT 192 12/23/2021     Radiology: No results found.  EKG: SR rate 89 ? Old ASMI  12/23/21    ASSESSMENT AND PLAN:   CAD:  History is vague and as far as I can tell he has not had interventions and has distal collateralized CTO's to RCA/LXC to be rx medially with beta blockers, norvasc, nitrates and unable to afford Ranexa  CVA:  not clear if his behavioral disorder is due to prior stroke    HTN:  Well controlled.  Continue current medications and low sodium Dash type diet.   HLD:  on statin labs with primary  OSA:  continue CPAP COPD: with pneumonia 12/2021 f/u with pulmonary currently not needing oxygen Thyroid:  on replacement TSH normal  BPH:   continue flomax and proscar PSA follow with primary    Patient will not be billed despite more than 90 minutes take to pre chart and look at records available from hospital admission and care everywhere as well as brief hostile visit on his part in clinic with security being called to escort him from office   Signed: Jenkins Rouge 04/23/2022, 3:37 PM

## 2022-04-23 ENCOUNTER — Ambulatory Visit (INDEPENDENT_AMBULATORY_CARE_PROVIDER_SITE_OTHER): Payer: Medicare HMO | Admitting: Cardiovascular Disease

## 2022-04-23 ENCOUNTER — Encounter: Payer: Self-pay | Admitting: Cardiovascular Disease

## 2022-04-23 VITALS — BP 178/100 | HR 72 | Ht 70.0 in | Wt 237.0 lb

## 2022-04-23 DIAGNOSIS — I2510 Atherosclerotic heart disease of native coronary artery without angina pectoris: Secondary | ICD-10-CM

## 2022-04-28 ENCOUNTER — Ambulatory Visit: Payer: Medicare HMO

## 2022-04-28 ENCOUNTER — Ambulatory Visit
Admission: EM | Admit: 2022-04-28 | Discharge: 2022-04-28 | Disposition: A | Discharge status: Home / Self Care | Payer: Medicare HMO | Attending: Nurse Practitioner | Admitting: Nurse Practitioner

## 2022-04-28 ENCOUNTER — Ambulatory Visit (INDEPENDENT_AMBULATORY_CARE_PROVIDER_SITE_OTHER): Payer: Medicare HMO

## 2022-04-28 DIAGNOSIS — J441 Chronic obstructive pulmonary disease with (acute) exacerbation: Secondary | ICD-10-CM

## 2022-04-28 DIAGNOSIS — Z1152 Encounter for screening for COVID-19: Secondary | ICD-10-CM

## 2022-04-28 DIAGNOSIS — R059 Cough, unspecified: Secondary | ICD-10-CM | POA: Diagnosis not present

## 2022-04-28 DIAGNOSIS — R0602 Shortness of breath: Secondary | ICD-10-CM

## 2022-04-28 MED ORDER — DOXYCYCLINE HYCLATE 100 MG PO CAPS: 100.0000 mg | ORAL_CAPSULE | Freq: Two times a day (BID) | ORAL | 0 refills | Status: DC

## 2022-04-28 MED ORDER — TRELEGY ELLIPTA 100-62.5-25 MCG/ACT IN AEPB
1.0000 | INHALATION_SPRAY | Freq: Every day | RESPIRATORY_TRACT | 0 refills | Status: DC
Start: 2022-04-28 — End: 2022-05-05

## 2022-04-28 MED ORDER — ALBUTEROL SULFATE (2.5 MG/3ML) 0.083% IN NEBU
2.5000 mg | INHALATION_SOLUTION | Freq: Once | RESPIRATORY_TRACT | Status: AC
  Administered 2022-04-28: 2.5 mg via RESPIRATORY_TRACT

## 2022-04-28 MED ORDER — ALBUTEROL SULFATE HFA 108 (90 BASE) MCG/ACT IN AERS: 1.0000 | INHALATION_SPRAY | Freq: Four times a day (QID) | RESPIRATORY_TRACT | 0 refills | Status: DC | PRN

## 2022-04-28 MED ORDER — PREDNISONE 20 MG PO TABS: 40.0000 mg | ORAL_TABLET | Freq: Every day | ORAL | 0 refills | Status: AC

## 2022-04-28 MED ORDER — ALBUTEROL SULFATE (2.5 MG/3ML) 0.083% IN NEBU: 2.5000 mg | INHALATION_SOLUTION | Freq: Four times a day (QID) | RESPIRATORY_TRACT | 0 refills | Status: DC | PRN

## 2022-04-28 NOTE — ED Triage Notes (Signed)
Pt has some sob as well

## 2022-04-28 NOTE — ED Provider Notes (Addendum)
[facility]    CSN: [id number] Arrival date & time: 04/28/22  1143      History   Chief Complaint Chief Complaint  Patient presents with   Nasal Congestion   Cough    HPI Robert Brown is a 71 y.o. male.   Patient presents today with wife for 3 days of cough, congestion, runny nose, sore throat, and feeling like he cannot take a deep breath.  He denies fevers, body aches, chills, wheezing, chest pain, runny nose, sinus pressure, headache, ear pain or pressure, abdominal pain, nausea/vomiting, diarrhea.  He endorses decreased appetite and fatigue.  He is feeling very poorly.  Denies known sick contacts.  Has not been using albuterol inhaler; reports he ran out of Trelegy a couple of months ago and has not been back to Pulmonologist so he does not have a refill yet.   Has been taking some over-the-counter medications with minimal relief.     Past Medical History:  Diagnosis Date   CAD (coronary artery disease)    Cancer ([place])    Diabetes mellitus without complication ([place])    Hypertension    Stroke [facility])     Patient Active Problem List   Diagnosis Date Noted   CAP (community acquired pneumonia) 12/20/2021   Acute respiratory failure with hypoxia ([place]) 05/25/2021   COVID-19 virus infection 05/25/2021   HTN (hypertension) 05/25/2021   HLD (hyperlipidemia) 05/25/2021   DM (diabetes mellitus), type 2 ([place]) 05/25/2021   CAD (coronary artery disease) 05/25/2021   Hypothyroidism 05/25/2021   Prostate cancer ([place]) 05/25/2021   COPD GOLD ?  05/25/2021    Past Surgical History:  Procedure Laterality Date   bone grafts     TUMOR EXCISION         Home Medications    Prior to Admission medications   Medication Sig Start Date End Date Taking? Authorizing Provider  albuterol (PROVENTIL) (2.5 MG/3ML) 0.083% nebulizer solution Take 3 mLs (2.5 mg total) by nebulization every 6 (six) hours as needed for wheezing or shortness of breath. 04/28/22  Yes Eulogio Bear, NP  doxycycline (VIBRAMYCIN) 100 MG capsule Take 1 capsule (100 mg total) by mouth 2 (two) times daily for 7 days. 04/28/22 05/05/22 Yes Eulogio Bear, NP  predniSONE (DELTASONE) 20 MG tablet Take 2 tablets (40 mg total) by mouth daily with breakfast for 5 days. 04/28/22 05/03/22 Yes Eulogio Bear, NP  albuterol (VENTOLIN HFA) 108 (90 Base) MCG/ACT inhaler Inhale 1-2 puffs into the lungs every 6 (six) hours as needed for shortness of breath or wheezing. 04/28/22   Eulogio Bear, NP  aspirin EC 325 MG tablet Take 325 mg by mouth daily.    [provider]  busPIRone (BUSPAR) 5 MG tablet Take by mouth. 07/03/21   [provider]  carvedilol (COREG) 25 MG tablet Take 25 mg by mouth 2 (two) times daily. 02/21/21   [provider]  ezetimibe (ZETIA) 10 MG tablet Take 10 mg by mouth daily. 04/03/21   [provider]  finasteride (PROSCAR) 5 MG tablet Take 5 mg by mouth daily. 02/09/20   [provider]  Fluticasone-Umeclidin-Vilant (TRELEGY ELLIPTA) 100-62.5-25 MCG/ACT AEPB Inhale 1 puff into the lungs daily. 04/28/22   Eulogio Bear, NP  glipiZIDE (GLUCOTROL XL) 5 MG 24 hr tablet Take 5 mg by mouth daily. 02/21/21   [provider]  hydrOXYzine (ATARAX) 10 MG tablet Take 1 tablet (10 mg total) by mouth every 8 (eight) hours as  needed for anxiety. 12/23/21   Barton Dubois, MD  isosorbide mononitrate (IMDUR) 60 MG 24 hr tablet Take 60 mg by mouth 2 (two) times daily. 02/27/21   [provider]  levothyroxine (SYNTHROID) 88 MCG tablet Take 88 mcg by mouth every morning. 11/09/21   [provider]  losartan-hydrochlorothiazide (HYZAAR) 100-12.5 MG tablet Take 1 tablet by mouth daily. 03/25/22   [provider]  metFORMIN (GLUCOPHAGE-XR) 500 MG 24 hr tablet Take 500 mg by mouth 2 (two) times daily. 01/12/20   [provider]  nabumetone (RELAFEN) 750 MG tablet Take 750 mg by mouth 2 (two) times daily.  02/21/21   [provider]  nitroGLYCERIN (NITROSTAT) 0.4 MG SL tablet Place under the tongue.    [provider]  pantoprazole (PROTONIX) 40 MG tablet Take 1 tablet (40 mg total) by mouth 2 (two) times daily. 12/23/21 12/23/22  Barton Dubois, MD  rosuvastatin (CRESTOR) 5 MG tablet Take 5 mg by mouth daily. 04/26/21   [provider]  tamsulosin (FLOMAX) 0.4 MG CAPS capsule Take 0.4 mg by mouth 2 (two) times daily. 12/23/19   [provider]    Family History Family History  Problem Relation Age of Onset   Diabetes Mother    Diabetes Father     Social History Social History   Tobacco Use   Smoking status: Former    Types: Cigarettes    Quit date: 11/17/2006    Years since quitting: 15.4   Smokeless tobacco: Never  Vaping Use   Vaping Use: Never used  Substance Use Topics   Alcohol use: Not Currently    Comment: former drinker   Drug use: Never     Allergies   Lisinopril and Statins   Review of Systems Review of Systems Per HPI  Physical Exam Triage Vital Signs ED Triage Vitals  Enc Vitals Group     BP 04/28/22 1206 (!) 162/74     Pulse Rate 04/28/22 1206 85     Resp 04/28/22 1206 (!) 22     Temp 04/28/22 1206 98.4 F (36.9 C)     Temp src --      SpO2 04/28/22 1206 92 %     Weight --      Height --      Head Circumference --      Peak Flow --      Pain Score 04/28/22 1202 0     Pain Loc --      Pain Edu? --      Excl. in [place]? --    No data found.  Updated Vital Signs BP (!) 162/74   Pulse 85   Temp 98.4 F (36.9 C)   Resp (!) 22   SpO2 96%   Visual Acuity Right Eye Distance:   Left Eye Distance:   Bilateral Distance:    Right Eye Near:   Left Eye Near:    Bilateral Near:     Physical Exam Vitals and nursing note reviewed.  Constitutional:      General: He is not in acute distress.    Appearance: Normal appearance. He is obese. He is not toxic-appearing.  HENT:     Head: Normocephalic and atraumatic.      Right Ear: Tympanic membrane, ear canal and external ear normal. There is no impacted cerumen.     Left Ear: Tympanic membrane, ear canal and external ear normal. There is no impacted cerumen.     Nose: Congestion and rhinorrhea present.  Mouth/Throat:     Mouth: Mucous membranes are moist.     Pharynx: Oropharynx is clear. No oropharyngeal exudate or posterior oropharyngeal erythema.  Eyes:     General: No scleral icterus.    Extraocular Movements: Extraocular movements intact.  Cardiovascular:     Rate and Rhythm: Normal rate and regular rhythm.  Pulmonary:     Effort: Tachypnea present. No accessory muscle usage, prolonged expiration, respiratory distress or retractions.     Breath sounds: Decreased air movement present. Examination of the right-middle field reveals decreased breath sounds. Examination of the left-middle field reveals decreased breath sounds. Examination of the right-lower field reveals decreased breath sounds. Examination of the left-lower field reveals decreased breath sounds. Decreased breath sounds present.  Musculoskeletal:     Cervical back: Normal range of motion.  Lymphadenopathy:     Cervical: No cervical adenopathy.  Skin:    General: Skin is warm and dry.     Capillary Refill: Capillary refill takes less than 2 seconds.     Coloration: Skin is not jaundiced or pale.     Findings: No erythema.  Neurological:     Mental Status: He is alert and oriented to person, place, and time.  Psychiatric:        Behavior: Behavior is cooperative.      UC Treatments / Results  Labs (all labs ordered are listed, but only abnormal results are displayed) Labs Reviewed  COVID-19, FLU A+B NAA    EKG   Radiology DG Chest 2 View  Result Date: 04/28/2022 CLINICAL DATA:  Cough and shortness of breath EXAM: CHEST - 2 VIEW COMPARISON:  02/03/2022 FINDINGS: Similar cardiomegaly. Minimally increased patchy density at the right lung base. No pleural effusion. No  pneumothorax. IMPRESSION: Minimal atelectasis/consolidation at the right lung base. Stable cardiomegaly. Electronically Signed   By: Macy Mis M.D.   On: 04/28/2022 12:33    Procedures Procedures (including critical care time)  Medications Ordered in UC Medications  albuterol (PROVENTIL) (2.5 MG/3ML) 0.083% nebulizer solution 2.5 mg (2.5 mg Nebulization Given 04/28/22 1312)    Initial Impression / Assessment and Plan / UC Course  I have reviewed the triage vital signs and the nursing notes.  Pertinent labs & imaging results that were available during my care of the patient were reviewed by me and considered in my medical decision making (see chart for details).    COVID-19 and influenza testing obtained today; he would be a good candidate for antiviral therapy if he does test positive.  Chest x-ray today shows minimal atelectasis/consolidation at the right lung base.  I reviewed imaging from earlier this year including chest x-ray from March 2023 that showed opacities in the infrahilar regions bilaterally; it does not appear this was ever followed up on as the patient was lost to follow-up with his pulmonologist.  Albuterol nebulizer given in urgent care today with significant improvement in breathing; wheezing in all lobes appreciated after nebulizer treatment-we provided him with a nebulizer machine and will start albuterol nebulizer every 4-6 hours as needed for wheezing or shortness of breath.  Resume Trelegy inhaler.  Treat for COPD exacerbation with prednisone 40 mg daily for 5 days, doxycycline twice daily for 7 days.  Follow-up with primary care or pulmonology within the next couple weeks for repeat chest x-ray to confirm resolution of the consolidation.  If his symptoms worsen despite this treatment, encouraged emergency room visit.  The patient was given the opportunity to ask questions.  All questions answered to  their satisfaction.  The patient is in agreement to this plan.   Final  Clinical Impressions(s) / UC Diagnoses   Final diagnoses:  COPD exacerbation ([place])     Discharge Instructions      - The x-ray today shows a small area in the right lung base that may be pneumonia -We have given you a breathing treatment today as well as a nebulizer machine.  Please pick up the nebulizer medicine and use this every 4-6 hours as needed for wheezing or shortness of breath.  Please use either the nebulizer medicine OR the rescue inhaler-not both. -Start the doxycycline and take it twice daily for 7 days to help clear up any infection - Resume Trelegy daily and make a follow up visit with the Pulmonologist      ED Prescriptions     Medication Sig Dispense Auth. Provider   Fluticasone-Umeclidin-Vilant (TRELEGY ELLIPTA) 100-62.5-25 MCG/ACT AEPB Inhale 1 puff into the lungs daily. 60 each Noemi Chapel A, NP   albuterol (VENTOLIN HFA) 108 (90 Base) MCG/ACT inhaler Inhale 1-2 puffs into the lungs every 6 (six) hours as needed for shortness of breath or wheezing. 8 g Noemi Chapel A, NP   albuterol (PROVENTIL) (2.5 MG/3ML) 0.083% nebulizer solution Take 3 mLs (2.5 mg total) by nebulization every 6 (six) hours as needed for wheezing or shortness of breath. 75 mL Noemi Chapel A, NP   predniSONE (DELTASONE) 20 MG tablet Take 2 tablets (40 mg total) by mouth daily with breakfast for 5 days. 10 tablet Noemi Chapel A, NP   doxycycline (VIBRAMYCIN) 100 MG capsule Take 1 capsule (100 mg total) by mouth 2 (two) times daily for 7 days. 14 capsule Eulogio Bear, NP      PDMP not reviewed this encounter.   Eulogio Bear, NP 04/28/22 1517    Eulogio Bear, NP 04/28/22 [phone]

## 2022-04-28 NOTE — Discharge Instructions (Signed)
-   The x-ray today shows a small area in the right lung base that may be pneumonia -We have given you a breathing treatment today as well as a nebulizer machine.  Please pick up the nebulizer medicine and use this every 4-6 hours as needed for wheezing or shortness of breath.  Please use either the nebulizer medicine OR the rescue inhaler-not both. -Start the doxycycline and take it twice daily for 7 days to help clear up any infection - Resume Trelegy daily and make a follow up visit with the Pulmonologist

## 2022-04-28 NOTE — ED Triage Notes (Signed)
Pt presents with nasal congestion and productive cough that began on Friday , wants covid testing

## 2022-04-29 LAB — COVID-19, FLU A+B NAA
Influenza A, NAA: NOT DETECTED
Influenza B, NAA: NOT DETECTED
SARS-CoV-2, NAA: NOT DETECTED

## 2022-05-05 ENCOUNTER — Ambulatory Visit: Payer: Medicare HMO | Admitting: Internal Medicine

## 2022-05-05 ENCOUNTER — Encounter: Payer: Self-pay | Admitting: Internal Medicine

## 2022-05-05 DIAGNOSIS — J449 Chronic obstructive pulmonary disease, unspecified: Secondary | ICD-10-CM

## 2022-05-05 MED ORDER — TRELEGY ELLIPTA 100-62.5-25 MCG/ACT IN AEPB
1.0000 | INHALATION_SPRAY | Freq: Every day | RESPIRATORY_TRACT | 11 refills | Status: DC
Start: 2022-05-05 — End: 2023-01-23

## 2022-05-05 NOTE — Progress Notes (Unsigned)
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   Discharge Diagnoses: Principal Problem:   Acute respiratory failure with hypoxia (HCC)   HTN (hypertension)   HLD (hyperlipidemia)   DM (diabetes mellitus), type 2 (HCC)   CAD (coronary artery disease)   Hypothyroidism   Prostate cancer ([place])   COPD (chronic obstructive pulmonary disease) ([place])   CAP (community acquired pneumonia)     Brief admission narrative:     71 y.o. male, with a history of coronary artery disease, hypertension, diabetes, COPD, no oxygen requirement at baseline, hyperlipidemia, CAD, prostate cancer, hypothyroidism,  November, report he never felt back to baseline after his COVID infection, patient presents to ED secondary to complaints of shortness of breath, cough and chest tightness, as well reports cough is productive with orange-colored sputum, he does reports chest pain worsening with dyspnea, denies any fever, chills, reports feeling fatigue.  Patient went to urgent care, as was found to be hypoxic, was started on 4 L oxygen and was sent to ED for further evaluation -in ED given his chest pain CTA was obtained, no PE, but significant for pneumonia, EKG nonacute, first troponin is negative, he is on 4 L nasal cannula upon my evaluation, dips to 86% once put on room air, nevic and wheezing which has improved with steroids and nebulizer treatment, given  his hypoxia, pneumonia, [facility] hospitalist consulted to admit.   Assessment and Plan: * Acute respiratory failure with hypoxia ([place])- (present on admission) -In the setting of community-acquired pneumonia and COPD exacerbation. -Continue treatment with steroids tapering, mucolytic's and bronchodilator management. -At discharge patient not requiring oxygen supplementation. -Continue the use of flutter valve and incentive spirometer has been requested. -Recommending outpatient follow-up with pulmonologist for PFTs and further adjustment on maintenance therapy for COPD.     CAP (community acquired pneumonia) -As mentioned above patient presenting community-acquired pneumonia -Continue antibiotic management -Patient is afebrile and not requiring oxygen supplementation. -Elevation in his WBCs in the presence of his steroid usage. -Repeat chest x-ray in 6-8 weeks to assure complete resolution of infiltrates.   COPD (chronic obstructive pulmonary disease) ([place])- (present on admission) -With acute exacerbation as mentioned above. -Continue treatment with steroids (tapering dose), mucolytic's and bronchodilator management. -Currently off oxygen and not requiring supplementation. -Encourage to use flutter valve and incentive spirometer as instructed. -Patient instructed to arrange outpatient follow-up with pulmonologist for PFTs and further adjustment to maintenance therapy. -Discharged on as needed albuterol, Breo Ellipta for maintenance and PPI therapy.   Prostate cancer ([place])- (present on admission) -History of prostate cancer and BPH -Continue treatment with Flomax and Proscar. -Reports no complaints of urinary retention currently. -Continue patient follow-up with urology service.   Hypothyroidism- (present on admission) -Continue Synthroid. -Continue to follow thyroid panel intermittently as an outpatient.   CAD (coronary artery disease)- (present on admission) -No complaining of chest  pain -Continue treatment with  Coreg, losartan, aspirin, Imdur and statin. -Continue risk factor modifications and outpatient follow-up with cardiology service.     DM (diabetes mellitus), type 2 ([place]) -Patient receiving sliding scale insulin and long-acting basal insulin while inpatient. -CBGs overall well controlled. -Resume home hypoglycemic regimen and follow modified, hydrate diet. -Some elevation on his CBGs will be anticipated while receiving steroid therapy. -Continue to follow blood glucose levels/A1c with further adjustment to hypoglycemic regimen as required.   -A1c 7.0 demonstrating good diabetic control.   HLD (hyperlipidemia)- (present on admission) -Continue statins -Heart healthy diet discussed with patient. -Continue to follow LFTs and lipid panel as an outpatient.   HTN (hypertension)- (present on admission) -Stable overall -Continue current antihypertensive regimen -Continue heart healthy diet. -Reassess blood pressure at follow-up visit with further adjustment of antihypertensive treatment as needed.   Class I obesity -Body mass index is 34.44 kg/m. -Low calorie diet, portion control and increase physical activity discussed with patient.     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 but has gained 60 lbs since covid  Limited by cp from walking to MB x 50 ft  Dyspnea:  still walking to MB but it's gradually more difficult since d/c with additional concern doe and not checking sats   Cough: none  Sleep: cpap sleeps flat 2 pillow SABA use: once in last few day, 3 h  prior to OV  and not using breo correctly  Rec Stop BREO and start Trelegy one click each am  Only use your albuterol as a rescue medication Ok to try albuterol 15 min (try one puff then next day two) before an activity (on alternating days)  that you know would usually make you short of breath   We will be referring you to the heart doctors at [facility] for stable angina > to ER if worsens    05/05/2022  f/u ov/Robert Brown office/Robert Brown re: presumed copd maint on trelegy  "when he can get it" but out at present Chief Complaint  Patient presents with   Follow-up    Had UC visit on 04/28/22 for COPD exacerbation. Still not feeling well.    Dyspnea:  baseline MB  is 50 ft up hill back s stopping and no cp now  Cough: better  Sleeping: Cpap / sleeps ok but mask doesn't fit/ f/u [facility] sleep doc SABA use: 2 puffs this am  and sev times  a day but no neb  02: none      No obvious day to day or daytime variability or assoc excess/ purulent sputum or mucus plugs or hemoptysis or cp or chest tightness, subjective wheeze or overt sinus or hb symptoms.    . Also denies any obvious fluctuation of symptoms with weather or environmental changes or other aggravating or alleviating factors except as outlined above   No unusual exposure hx or h/o childhood pna/ asthma or knowledge of premature birth.  Current Allergies, Complete Past Medical History, Past Surgical History, Family History, and Social History were reviewed in [employer] record.  ROS  The following are not active complaints  unless bolded Hoarseness, sore throat, dysphagia, dental problems, itching, sneezing,  nasal congestion or discharge of excess mucus or purulent secretions, ear ache,   fever, chills, sweats, unintended wt loss or wt gain, classically pleuritic or exertional cp,  orthopnea pnd or arm/hand swelling  or leg swelling, presyncope, palpitations, abdominal pain, anorexia, nausea, vomiting, diarrhea  or change  in bowel habits or change in bladder habits, change in stools or change in urine, dysuria, hematuria,  rash, arthralgias, visual complaints, headache, numbness, weakness or ataxia or problems with walking or coordination,  change in mood or  memory.        Current Meds  Medication Sig   albuterol (PROVENTIL) (2.5 MG/3ML) 0.083% nebulizer solution Take 3 mLs (2.5 mg total) by nebulization every 6 (six) hours as needed for wheezing or shortness of breath.   albuterol (VENTOLIN HFA) 108 (90 Base) MCG/ACT inhaler Inhale 1-2 puffs into the lungs every 6 (six) hours as needed for shortness of breath or wheezing.   aspirin EC 325 MG tablet Take 325 mg by mouth daily.   busPIRone (BUSPAR) 5 MG tablet Take by mouth.   carvedilol (COREG) 25 MG tablet Take 25 mg by mouth 2 (two) times daily.   ezetimibe (ZETIA) 10 MG tablet Take 10 mg by mouth daily.   finasteride (PROSCAR) 5 MG tablet Take 5 mg by mouth daily.   Fluticasone-Umeclidin-Vilant (TRELEGY ELLIPTA) 100-62.5-25 MCG/ACT AEPB Inhale 1 puff into the lungs daily.   glipiZIDE (GLUCOTROL XL) 5 MG 24 hr tablet Take 5 mg by mouth daily.   hydrOXYzine (ATARAX) 10 MG tablet Take 1 tablet (10 mg total) by mouth every 8 (eight) hours as needed for anxiety.   isosorbide mononitrate (IMDUR) 60 MG 24 hr tablet Take 60 mg by mouth 2 (two) times daily.   levothyroxine (SYNTHROID) 88 MCG tablet Take 88 mcg by mouth every morning.   losartan-hydrochlorothiazide (HYZAAR) 100-12.5 MG tablet Take 1 tablet by mouth daily.   metFORMIN (GLUCOPHAGE-XR) 500 MG 24 hr tablet Take 500 mg by mouth 2 (two) times daily.   nabumetone (RELAFEN) 750 MG tablet Take 750 mg by mouth 2 (two) times daily.   nitroGLYCERIN (NITROSTAT) 0.4 MG SL tablet Place under the tongue.   pantoprazole (PROTONIX) 40 MG tablet Take 1 tablet (40 mg total) by mouth 2 (two) times daily.   rosuvastatin (CRESTOR) 5 MG tablet Take 5 mg by mouth daily.   tamsulosin (FLOMAX) 0.4 MG CAPS capsule Take 0.4 mg  by mouth 2 (two) times daily.                   Past Medical History:  Diagnosis Date   CAD (coronary artery disease)    Cancer ([place])    Diabetes mellitus without complication ([place])    Hypertension    Stroke ([place])        Objective:     05/05/2022       231   04/23/22 237 lb (107.5 kg)  02/03/22 239 lb (108.4 kg)  12/20/21 240 lb (108.9 kg)    Vital signs reviewed  05/05/2022  - Note at rest 02 sats  95% on RA   General appearance:    amb wm nad     HEENT : Oropharynx  clear   Nasal turbinates nl    NECK :  without  apparent JVD/ palpable Nodes/TM    LUNGS: no acc muscle use,  Mild barrel  contour chest wall with bilateral  Distant bs s audible wheeze  and  without cough on insp or exp maneuvers  and mild  Hyperresonant  to  percussion bilaterally     CV:  RRR  no s3 or murmur or increase in P2, and no edema   ABD:  soft and nontender with pos end  insp Hoover's  in the supine position.  No bruits or organomegaly appreciated   MS:  Nl gait/ ext warm without deformities Or obvious joint restrictions  calf tenderness, cyanosis or clubbing     SKIN: warm and dry without lesions    NEURO:  alert, approp, nl sensorium with  no motor or cerebellar deficits apparent.           I personally reviewed images and agree with radiology impression as follows:  CXR:   pa and lateral 04/28/22  Minimal atelectasis/consolidation at the right lung base. Stable cardiomegaly.         Assessment

## 2022-05-05 NOTE — Patient Instructions (Addendum)
Please contact your sleep doctor to get better mask and see if cpap needs to be adjusting before adding 02   Plan A = Automatic = Always=    restart Trelegy 657 one click each am   Plan B = Backup (to supplement plan A, not to replace it) Only use your albuterol inhaler as a rescue medication to be used if you can't catch your breath by resting or doing a relaxed purse lip breathing pattern.  - The less you use it, the better it will work when you need it. - Ok to use the inhaler up to 2 puffs  every 4 hours if you must but call for appointment if use goes up over your usual need - Don't leave home without it !!  (think of it like the spare tire for your car)   Plan C = Crisis (instead of Plan B but only if Plan B stops working) - only use your albuterol nebulizer if you first try Plan B and it fails to help > ok to use the nebulizer up to every 4 hours but if start needing it regularly call for immediate appointment   Keep your cardiology appt   Let me know if you want to be referred to our sleep medicine specialist   Please schedule a follow up visit in 3 months but call sooner if needed  with all medications /inhalers/ solutions in hand so we can verify exactly what you are taking. This includes all medications from all doctors and over the counters

## 2022-05-06 ENCOUNTER — Telehealth: Payer: Self-pay | Admitting: Internal Medicine

## 2022-05-06 ENCOUNTER — Encounter: Payer: Self-pay | Admitting: Internal Medicine

## 2022-05-06 DIAGNOSIS — J449 Chronic obstructive pulmonary disease, unspecified: Secondary | ICD-10-CM

## 2022-05-06 NOTE — Assessment & Plan Note (Signed)
Quit smoking 2008 and symptoms started p covid July 2022  -  02/03/2022   Walked on RA  x  3  lap(s) =  approx 450  ft  @ slow pace, stopped due to sob  with lowest 02 sats 91%  - 05/05/2022  After extensive coaching inhaler device,  effectiveness =    75% with DPI > continue trelegy and approp saba  - 05/05/2022   Walked on RA  x  3  lap(s) =  approx 350  ft  @ slow pace, stopped due to end of study s sob or cp  with lowest 02 sats 92%    Clinically,  Group D (now reclassified as E) in terms of symptom/risk and laba/lama/ICS  therefore appropriate rx at this point >>>  Continue trelegy and approp saba:  Re SABA :  I spent extra time with pt today reviewing appropriate use of albuterol for prn use on exertion with the following points: 1) saba is for relief of sob that does not improve by walking a slower pace or resting but rather if the pt does not improve after trying this first. 2) If the pt is convinced, as many are, that saba helps recover from activity faster then it's easy to tell if this is the case by re-challenging : ie stop, take the inhaler, then p 5 minutes try the exact same activity (intensity of workload) that just caused the symptoms and see if they are substantially diminished or not after saba 3) if there is an activity that reproducibly causes the symptoms, try the saba 15 min before the activity on alternate days   If in fact the saba really does help, then fine to continue to use it prn but advised may need to look closer at the maintenance regimen being used to achieve better control of airways disease with exertion.   Needs pfts prior to next ov   Keep f/u with cards as suspect element of chf also contributing to doe.   Keep f/u with [facility] re cpap issues > refer here if prefers   Each maintenance medication was reviewed in detail including emphasizing most importantly the difference between maintenance and prns and under what circumstances the prns are to  be triggered using an action plan format where appropriate.  Total time for H and P, chart review, counseling, reviewing dpi/hfa/neb device(s) , directly observing portions of ambulatory 02 saturation study/ and generating customized AVS unique to this office visit / same day charting > 30 min for    refractory respiratory  symptoms of uncertain etiology

## 2022-05-06 NOTE — Telephone Encounter (Signed)
Pft order placed. Was discussed with pt yesterday at ov.

## 2022-05-06 NOTE — Telephone Encounter (Signed)
Needs Pfts prior to next ov

## 2022-06-03 ENCOUNTER — Ambulatory Visit: Payer: Medicare HMO | Admitting: Internal Medicine

## 2022-06-18 ENCOUNTER — Ambulatory Visit: Payer: Medicare HMO | Admitting: Cardiology

## 2022-06-18 ENCOUNTER — Encounter: Payer: Self-pay | Admitting: Cardiology

## 2022-06-18 VITALS — BP 130/64 | HR 79 | Ht 70.0 in | Wt 231.2 lb

## 2022-06-18 DIAGNOSIS — R0989 Other specified symptoms and signs involving the circulatory and respiratory systems: Secondary | ICD-10-CM

## 2022-06-18 DIAGNOSIS — I208 Other forms of angina pectoris: Secondary | ICD-10-CM | POA: Diagnosis not present

## 2022-06-18 NOTE — Patient Instructions (Signed)
Medication Instructions:  The current medical regimen is effective;  continue present plan and medications.  *If you need a refill on your cardiac medications before your next appointment, please call your pharmacy*   Testing/Procedures: Your physician has requested that you have a carotid duplex. This test is an ultrasound of the carotid arteries in your neck. It looks at blood flow through these arteries that supply the brain with blood. Allow one hour for this exam. There are no restrictions or special instructions.  You have been referred to Cardiac Rehab and will be contacted to schedule.  Follow-Up: At [facility], you and your health needs are our priority.  As part of our continuing mission to provide you with exceptional heart care, we have created designated Provider Care Teams.  These Care Teams include your primary Cardiologist (physician) and Advanced Practice Providers (APPs -  Physician Assistants and Nurse Practitioners) who all work together to provide you with the care you need, when you need it.  We recommend signing up for the patient portal called "MyChart".  Sign up information is provided on this After Visit Summary.  MyChart is used to connect with patients for Virtual Visits (Telemedicine).  Patients are able to view lab/test results, encounter notes, upcoming appointments, etc.  Non-urgent messages can be sent to your provider as well.   To learn more about what you can do with MyChart, go to [email or web address].    Your next appointment:   6 month(s)  The format for your next appointment:   In Person  Provider:   You may see Dr Marlou Porch or one of the following Advanced Practice Providers on your designated Care Team:   Bernerd Pho, PA-C  Ermalinda Barrios, [place] {    Important Information About Sugar

## 2022-06-18 NOTE — Progress Notes (Signed)
Cardiology Office Note:    Date:  06/18/2022   ID:  Robert Brown, DOB 1951-10-09, MRN [id number]  PCP:  Leonie Douglas, MD   [facility] Providers Cardiologist:  None     Referring MD: Tanda Rockers, MD    History of Present Illness:    Robert Brown is a 71 y.o. male here for the evaluation of chest pain at the request of Dr. Melvyn Novas.  Was seen recently by Dr. Johnsie Cancel on 04/23/2022.  Notes reviewed.  He was hospitalized for pneumonia in February 2023 on top of COPD.  Has diabetes with hemoglobin A1c of 7 with hypertension hyperlipidemia.  Prior notes have been demonstrated substernal chest pain from coughing and pneumonia with no acute ECG changes and negative troponin.  Had been on aspirin statin isosorbide and now diltiazem CD180.  Has obstructive sleep apnea on CPAP.    Stroke in 2018   When Dr. Johnsie Cancel saw him previously he was able to find out that had a heart catheterization at [facility], had had 2 heart attacks previously and had a cardiac catheterization in 2022 with distal RCA left circumflex occlusion and stable collaterals, no change from heart catheterization in 2020.  He was placed on optimal medical management.  Ranexa was cost prohibitive.  He left the clinic shortly after this conversation.  I see him today.  He is feeling better after the addition of diltiazem to his carvedilol 25 twice a day and isosorbide 60 twice a day.  His blood pressure is under better control.  He is having less anginal symptoms of left arm pain shoulder discomfort.  He is the sole caregiver of his wife who has had a stroke.  She is in a wheelchair.  We went over his heart catheterization.  Medical management.     Past Medical History:  Diagnosis Date   CAD (coronary artery disease)    Cancer ([place])    Diabetes mellitus without complication ([place])    Hypertension    Stroke [facility])     Past Surgical History:  Procedure Laterality Date   bone grafts     TUMOR EXCISION      Current  Medications: Current Meds  Medication Sig   albuterol (PROVENTIL) (2.5 MG/3ML) 0.083% nebulizer solution Take 3 mLs (2.5 mg total) by nebulization every 6 (six) hours as needed for wheezing or shortness of breath.   albuterol (VENTOLIN HFA) 108 (90 Base) MCG/ACT inhaler Inhale 1-2 puffs into the lungs every 6 (six) hours as needed for shortness of breath or wheezing.   aspirin EC 325 MG tablet Take 325 mg by mouth daily.   busPIRone (BUSPAR) 5 MG tablet Take by mouth.   carvedilol (COREG) 25 MG tablet Take 25 mg by mouth 2 (two) times daily.   diltiazem (CARDIZEM CD) 180 MG 24 hr capsule Take 180 mg by mouth daily.   ezetimibe (ZETIA) 10 MG tablet Take 10 mg by mouth daily.   finasteride (PROSCAR) 5 MG tablet Take 5 mg by mouth daily.   Fluticasone-Umeclidin-Vilant (TRELEGY ELLIPTA) 100-62.5-25 MCG/ACT AEPB Inhale 1 puff into the lungs daily.   glipiZIDE (GLUCOTROL XL) 5 MG 24 hr tablet Take 5 mg by mouth daily.   hydrOXYzine (ATARAX) 10 MG tablet Take 1 tablet (10 mg total) by mouth every 8 (eight) hours as needed for anxiety.   isosorbide mononitrate (IMDUR) 60 MG 24 hr tablet Take 60 mg by mouth 2 (two) times daily.   levothyroxine (SYNTHROID) 88 MCG tablet Take 88 mcg by mouth  every morning.   losartan-hydrochlorothiazide (HYZAAR) 100-12.5 MG tablet Take 1 tablet by mouth daily.   metFORMIN (GLUCOPHAGE-XR) 500 MG 24 hr tablet Take 500 mg by mouth 2 (two) times daily.   nabumetone (RELAFEN) 750 MG tablet Take 750 mg by mouth 2 (two) times daily.   nitroGLYCERIN (NITROSTAT) 0.4 MG SL tablet Place under the tongue.   pantoprazole (PROTONIX) 40 MG tablet Take 1 tablet (40 mg total) by mouth 2 (two) times daily.   rosuvastatin (CRESTOR) 5 MG tablet Take 5 mg by mouth daily.   tamsulosin (FLOMAX) 0.4 MG CAPS capsule Take 0.4 mg by mouth 2 (two) times daily.     Allergies:   Lisinopril and Statins   Social History   Socioeconomic History   Marital status: Married    Spouse name: Not on  file   Number of children: Not on file   Years of education: Not on file   Highest education level: Not on file  Occupational History   Not on file  Tobacco Use   Smoking status: Former    Types: Cigarettes    Quit date: 11/17/2006    Years since quitting: 15.5   Smokeless tobacco: Never  Vaping Use   Vaping Use: Never used  Substance and Sexual Activity   Alcohol use: Not Currently    Comment: former drinker   Drug use: Never   Sexual activity: Not Currently  Other Topics Concern   Not on file  Social History Narrative   Not on file   Social Determinants of Health   Financial Resource Strain: Not on file  Food Insecurity: Not on file  Transportation Needs: Not on file  Physical Activity: Not on file  Stress: Not on file  Social Connections: Not on file     Family History: The patient's family history includes Diabetes in his father and mother.  ROS:   Please see the history of present illness.     All other systems reviewed and are negative.  EKGs/Labs/Other Studies Reviewed:    The following studies were reviewed today: Catheterization 03/08/2021 -Summary:  The angiogram was most notable for chronic occlusion of the distal/apical  LAD and distal RCA.  There is otherwise scattered mild, nonocclusive  disease of the circumflex and proximal LAD.  Findings are unchanged from  the previous study 2 years ago.   Plan:  Pursue aggressive medical therapy for angina prophylaxis and secondary  prevention.  Echocardiogram 10/04/2021: Left Ventricle: Systolic function is normal. EF: 60-65%.    Left Ventricle: Doppler parameters consistent with moderate diastolic  dysfunction and elevated LA pressure.    Left Atrium: Left atrium is mildly dilated.    Mitral Valve: There is mild regurgitation.    Aortic Valve: Trace to mild aortic valve regurgitation.   Normal LVEF  LVH  Moderate diastolic dysfunction  Mild MR  Trace AR  Recent Labs: 12/20/2021: ALT 23; B  Natriuretic Peptide 24.0 12/23/2021: BUN 22; Creatinine, Ser 0.93; Hemoglobin 13.9; Platelets 192; Potassium 3.2; Sodium 141  Recent Lipid Panel No results found for: "CHOL", "TRIG", "HDL", "CHOLHDL", "VLDL", "LDLCALC", "LDLDIRECT"   Risk Assessment/Calculations:            Cardiac Rehabilitation Eligibility Assessment       Physical Exam:    VS:  BP 130/64   Pulse 79   Ht '5\' 10"'$  (1.778 m)   Wt 231 lb 3.2 oz (104.9 kg)   SpO2 94%   BMI 33.17 kg/m     Wt Readings  from Last 3 Encounters:  06/18/22 231 lb 3.2 oz (104.9 kg)  05/05/22 231 lb (104.8 kg)  04/23/22 237 lb (107.5 kg)     GEN:  Well nourished, well developed in no acute distress HEENT: Normal NECK: No JVD; No carotid bruits LYMPHATICS: No lymphadenopathy CARDIAC: RRR, no murmurs, no rubs, gallops RESPIRATORY:  Clear to auscultation without rales, wheezing or rhonchi  ABDOMEN: Soft, non-tender, non-distended MUSCULOSKELETAL:  No edema; No deformity  SKIN: Warm and dry NEUROLOGIC:  Alert and oriented x 3 PSYCHIATRIC:  Normal affect   ASSESSMENT:    1. Stable angina (HCC)   2. Carotid bruit, unspecified laterality    PLAN:    In order of problems listed above:  Coronary artery disease with angina - No interventions distal Collateralization CTO RCA left circumflex medically managed from prior cardiac catheterization reports.  On beta-blockers diltiazem nitrates.  Lets continue with current plan.  He is feeling better.  In the past Ranexa was cost prohibitive.  He is on isosorbide 60 mg twice a day.  I usually do not see much benefit going above this dose.  I think the addition of the diltiazem 180 really has helped. - I have also put in a referral for him to seek out cardiac rehab given his coronary disease prior MI known refractory angina COPD on multidrug regimen.  This would be very helpful to have supervised exercise program.  We will see if this is available through his insurance.  Prior stroke - Noted  as above. -I will check carotid Dopplers since it has been a few years he states.  COPD - Dr. Melvyn Novas has seen.  Prior pneumonia in February 2022.  Baseline exercise would be helpful for him.   45 minutes spent with precharting, review of medical records, care everywhere, discussion with patient.   Medication Adjustments/Labs and Tests Ordered: Current medicines are reviewed at length with the patient today.  Concerns regarding medicines are outlined above.  Orders Placed This Encounter  Procedures   US Carotid Bilateral   AMB referral to cardiac rehabilitation   No orders of the defined types were placed in this encounter.   Patient Instructions  Medication Instructions:  The current medical regimen is effective;  continue present plan and medications.  *If you need a refill on your cardiac medications before your next appointment, please call your pharmacy*   Testing/Procedures: Your physician has requested that you have a carotid duplex. This test is an ultrasound of the carotid arteries in your neck. It looks at blood flow through these arteries that supply the brain with blood. Allow one hour for this exam. There are no restrictions or special instructions.  You have been referred to Cardiac Rehab and will be contacted to schedule.  Follow-Up: At [facility], you and your health needs are our priority.  As part of our continuing mission to provide you with exceptional heart care, we have created designated Provider Care Teams.  These Care Teams include your primary Cardiologist (physician) and Advanced Practice Providers (APPs -  Physician Assistants and Nurse Practitioners) who all work together to provide you with the care you need, when you need it.  We recommend signing up for the patient portal called "MyChart".  Sign up information is provided on this After Visit Summary.  MyChart is used to connect with patients for Virtual Visits (Telemedicine).  Patients are able to  view lab/test results, encounter notes, upcoming appointments, etc.  Non-urgent messages can be sent to your provider  as well.   To learn more about what you can do with MyChart, go to [email or web address].    Your next appointment:   6 month(s)  The format for your next appointment:   In Person  Provider:   You may see Dr Marlou Porch or one of the following Advanced Practice Providers on your designated Care Team:   Bernerd Pho, PA-C  Ermalinda Barrios, [place] {    Important Information About Sugar         Signed, Candee Furbish, MD  06/18/2022 4:28 PM    [place]

## 2022-06-19 ENCOUNTER — Other Ambulatory Visit: Payer: Self-pay | Admitting: Cardiology

## 2022-06-19 DIAGNOSIS — I6523 Occlusion and stenosis of bilateral carotid arteries: Secondary | ICD-10-CM

## 2022-06-25 ENCOUNTER — Ambulatory Visit (INDEPENDENT_AMBULATORY_CARE_PROVIDER_SITE_OTHER): Payer: Medicare HMO

## 2022-06-25 ENCOUNTER — Other Ambulatory Visit (HOSPITAL_COMMUNITY): Payer: Self-pay | Admitting: Cardiology

## 2022-06-25 ENCOUNTER — Other Ambulatory Visit: Payer: Self-pay | Admitting: Cardiology

## 2022-06-25 DIAGNOSIS — I6523 Occlusion and stenosis of bilateral carotid arteries: Secondary | ICD-10-CM

## 2022-07-15 ENCOUNTER — Ambulatory Visit (HOSPITAL_COMMUNITY)
Admission: RE | Admit: 2022-07-15 | Discharge: 2022-07-15 | Disposition: A | Discharge status: Home / Self Care | Payer: Medicare HMO | Source: Ambulatory Visit | Attending: Internal Medicine | Admitting: Internal Medicine

## 2022-07-15 DIAGNOSIS — J449 Chronic obstructive pulmonary disease, unspecified: Secondary | ICD-10-CM | POA: Insufficient documentation

## 2022-07-15 LAB — PULMONARY FUNCTION TEST
DL/VA % pred: 121 %
DL/VA: 4.91 ml/min/mmHg/L
DLCO unc % pred: 61 %
DLCO unc: 15.87 ml/min/mmHg
FEF 25-75 Post: 1.6 L/sec
FEF 25-75 Pre: 1.89 L/sec
FEF2575-%Change-Post: -15 %
FEF2575-%Pred-Post: 65 %
FEF2575-%Pred-Pre: 77 %
FEV1-%Change-Post: -1 %
FEV1-%Pred-Post: 50 %
FEV1-%Pred-Pre: 51 %
FEV1-Post: 1.64 L
FEV1-Pre: 1.66 L
FEV1FVC-%Change-Post: -2 %
FEV1FVC-%Pred-Pre: 110 %
FEV6-%Change-Post: 5 %
FEV6-%Pred-Post: 49 %
FEV6-%Pred-Pre: 47 %
FEV6-Post: 2.06 L
FEV6-Pre: 1.96 L
FEV6FVC-%Pred-Post: 106 %
FEV6FVC-%Pred-Pre: 106 %
FVC-%Change-Post: 1 %
FVC-%Pred-Post: 47 %
FVC-%Pred-Pre: 46 %
FVC-Post: 2.07 L
FVC-Pre: 2.04 L
Post FEV1/FVC ratio: 79 %
Post FEV6/FVC ratio: 100 %
Pre FEV1/FVC ratio: 81 %
Pre FEV6/FVC Ratio: 100 %

## 2022-07-15 MED ORDER — ALBUTEROL SULFATE (2.5 MG/3ML) 0.083% IN NEBU
2.5000 mg | INHALATION_SOLUTION | Freq: Once | RESPIRATORY_TRACT | Status: AC
  Administered 2022-07-15: 2.5 mg via RESPIRATORY_TRACT

## 2022-07-29 ENCOUNTER — Ambulatory Visit: Payer: Medicare HMO | Admitting: Internal Medicine

## 2022-07-29 ENCOUNTER — Encounter: Payer: Self-pay | Admitting: Internal Medicine

## 2022-07-29 DIAGNOSIS — J449 Chronic obstructive pulmonary disease, unspecified: Secondary | ICD-10-CM

## 2022-07-29 NOTE — Assessment & Plan Note (Addendum)
Complicated by OSA/ hbp/ hyperlipidemia / AODM and ERV 22% on pfts 07/15/22  at wt 235  Body mass index is 33.58 kg/m.  -  No results found for: "TSH"    Contributing to doe and risk of GERD >>>   reviewed the need and the process to achieve and maintain neg calorie balance > defer f/u primary care including intermittently monitoring thyroid status             Each maintenance medication was reviewed in detail including emphasizing most importantly the difference between maintenance and prns and under what circumstances the prns are to be triggered using an action plan format where appropriate.  Total time for H and P, chart review, counseling, reviewing hfa/dpi  device(s) and generating customized AVS unique to this office visit / same day charting = 34 min summary f/u ov   F/u yearly

## 2022-07-29 NOTE — Assessment & Plan Note (Addendum)
Quit smoking 2008 and symptoms started p covid July 2022  -  02/03/2022   Walked on RA  x  3  lap(s) =  approx 450  ft  @ slow pace, stopped due to sob  with lowest 02 sats 91%  - 05/05/2022  After extensive coaching inhaler device,  effectiveness =    75% with DPI > continue trelegy and approp saba  - 05/05/2022   Walked on RA  x  3  lap(s) =  approx 350  ft  @ slow pace, stopped due to end of study s sob or cp  with lowest 02 sats 92%   - PFT's  07/15/22  FEV1 1.66 (51 % ) ratio 0.81  p 0 % improvement from saba p trelegy prior to study with DLCO  15.87 (61%)   and FV curve no significant curvatureand ERV 22% at wt 235    - The proper method of use, as well as anticipated side effects, of a metered-dose inhaler were discussed and demonstrated to the patient using teach back method. Improved effectiveness after extensive coaching during this visit to a level of approximately 80 % from a baseline of 50% >>>  Continue trelegy 100 plus approp saba  As based on his response to trelegy he has an active asthmatic bronchitic component

## 2022-07-29 NOTE — Patient Instructions (Addendum)
No change medications  Work on inhaler technique:  relax and gently blow all the way out then take a nice smooth full deep breath back in, triggering the inhaler at same time you start breathing in.  Hold breath in for at least  5 seconds if you can.   Rinse and gargle with water when done.  If mouth or throat bother you at all,  try brushing teeth/gums/tongue with arm and hammer toothpaste/ make a slurry and gargle and spit out.     Work on wt loss   Have your doctor refer you to an orthopedic doctor ASAP   Please schedule a follow up visit in 12  months but call sooner if needed

## 2022-07-29 NOTE — Progress Notes (Signed)
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   Discharge Diagnoses: Principal Problem:   Acute respiratory failure with hypoxia (HCC)   HTN (hypertension)   HLD (hyperlipidemia)   DM (diabetes mellitus), type 2 (HCC)   CAD (coronary artery disease)   Hypothyroidism   Prostate cancer ([place])   COPD (chronic obstructive pulmonary disease) ([place])   CAP (community acquired pneumonia)     Brief admission narrative:     71 y.o. male, with a history of coronary artery disease, hypertension, diabetes, COPD, no oxygen requirement at baseline, hyperlipidemia, CAD, prostate cancer, hypothyroidism,  November, report he never felt back to baseline after his COVID infection, patient presents to ED secondary to complaints of shortness of breath, cough and chest tightness, as well reports cough is productive with orange-colored sputum, he does reports chest pain worsening with dyspnea, denies any fever, chills, reports feeling fatigue.  Patient went to urgent care, as was found to be hypoxic, was started on 4 L oxygen and was sent to ED for further evaluation -in ED given his chest pain CTA was obtained, no PE, but significant for pneumonia, EKG nonacute, first troponin is negative, he is on 4 L nasal cannula upon my evaluation, dips to 86% once put on room air, nevic and wheezing which has improved with steroids and nebulizer treatment, given  his hypoxia, pneumonia, [facility]ist consulted to admit.   Assessment and Plan: * Acute respiratory failure with hypoxia ([place])- (present on admission) -In the setting of community-acquired pneumonia and COPD exacerbation. -Continue treatment with steroids tapering, mucolytic's and bronchodilator management. -At discharge patient not requiring oxygen supplementation. -Continue the use of flutter valve and incentive spirometer has been requested. -Recommending outpatient follow-up with pulmonologist for PFTs and further adjustment on maintenance therapy for COPD.     CAP (community acquired pneumonia) -As mentioned above patient presenting community-acquired pneumonia -Continue antibiotic management -Patient is afebrile and not requiring oxygen supplementation. -Elevation in his WBCs in the presence of his steroid usage. -Repeat chest x-ray in 6-8 weeks to assure complete resolution of infiltrates.   COPD (chronic obstructive pulmonary disease) ([place])- (present on admission) -With acute exacerbation as mentioned above. -Continue treatment with steroids (tapering dose), mucolytic's and bronchodilator management. -Currently off oxygen and not requiring supplementation. -Encourage to use flutter valve and incentive spirometer as instructed. -Patient instructed to arrange outpatient follow-up with pulmonologist for PFTs and further adjustment to maintenance therapy. -Discharged on as needed albuterol, Breo Ellipta for maintenance and PPI therapy.   Prostate cancer ([place])- (present on admission) -History of prostate cancer and BPH -Continue treatment with Flomax and Proscar. -Reports no complaints of urinary retention currently. -Continue patient follow-up with urology service.   Hypothyroidism- (present on admission) -Continue Synthroid. -Continue to follow thyroid panel intermittently as an outpatient.   CAD (coronary artery disease)- (present on admission) -No complaining of chest  pain -Continue treatment with  Coreg, losartan, aspirin, Imdur and statin. -Continue risk factor modifications and outpatient follow-up with cardiology service.     DM (diabetes mellitus), type 2 ([place]) -Patient receiving sliding scale insulin and long-acting basal insulin while inpatient. -CBGs overall well controlled. -Resume home hypoglycemic regimen and follow modified, hydrate diet. -Some elevation on his CBGs will be anticipated while receiving steroid therapy. -Continue to follow blood glucose levels/A1c with further adjustment to hypoglycemic regimen as required.   -A1c 7.0 demonstrating good diabetic control.   HLD (hyperlipidemia)- (present on admission) -Continue statins -Heart healthy diet discussed with patient. -Continue to follow LFTs and lipid panel as an outpatient.   HTN (hypertension)- (present on admission) -Stable overall -Continue current antihypertensive regimen -Continue heart healthy diet. -Reassess blood pressure at follow-up visit with further adjustment of antihypertensive treatment as needed.   Class I obesity -Body mass index is 34.44 kg/m. -Low calorie diet, portion control and increase physical activity discussed with patient.     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 but has gained 60 lbs since covid  Limited by cp from walking to MB x 50 ft  Dyspnea:  still walking to MB but it's gradually more difficult since d/c with additional concern doe and not checking sats   Cough: none  Sleep: cpap sleeps flat 2 pillow SABA use: once in last few day, 3 h  prior to OV  and not using breo correctly  Rec Stop BREO and start Trelegy one click each am  Only use your albuterol as a rescue medication Ok to try albuterol 15 min (try one puff then next day two) before an activity (on alternating days)  that you know would usually make you short of breath   We will be referring you to the heart doctors at [facility] for stable angina > to ER if worsens    05/05/2022  f/u ov/[place] office/Leeasia Secrist re: presumed copd maint on trelegy  "when he can get it" but out at present Chief Complaint  Patient presents with   Follow-up    Had UC visit on 04/28/22 for COPD exacerbation. Still not feeling well.    Dyspnea:  baseline MB  is 50 ft up hill back s stopping and no cp now  Cough: better  Sleeping: Cpap / sleeps ok but mask doesn't fit/ f/u [facility] sleep doc SABA use: 2 puffs this am  and sev times  a day but no neb  02: none  Rec Please contact your sleep doctor to get better mask and see if cpap needs to be adjusting before adding 02  Plan A = Automatic = Always=    restart Trelegy 712 one click each am  Plan B = Backup (to supplement plan A, not to replace it) Only use your albuterol inhaler as a rescue medication Plan C = Crisis (instead of Plan B but only if Plan B stops working) - only use your albuterol nebulizer if you first try Plan B and it fails to help Keep your cardiology appt   Let me know if you want to be referred to our  sleep medicine specialist   Please schedule a follow up visit in 3 months but call sooner if needed  with  PFTS and all medications /inhalers/ solutions in hand      07/29/2022  f/u ov/[place] office/Elizabeth Paulsen re: AB maint on trelegy  / feels def helped doe  Chief Complaint  Patient presents with   Follow-up    He is doing better since last OV.   Dyspnea:  more out side activity/ mb better but now more limited by leg pains/ numbness esp L lateral s back pain or bowel /bladder issues  Cough: none  Sleeping: cpap doing fine   SABA use: once qod  02: none  Covid status: vax all      No obvious day to day or daytime variability or assoc excess/ purulent sputum or mucus plugs or hemoptysis or cp or chest tightness, subjective wheeze or overt sinus or hb symptoms.   Sleeping  without nocturnal  or early am exacerbation  of respiratory  c/o's or need for noct saba. Also denies any obvious fluctuation of symptoms with weather or environmental changes or other aggravating or alleviating factors except as outlined above   No unusual exposure hx or h/o childhood pna/ asthma or knowledge of premature birth.  Current Allergies, Complete Past Medical History, Past Surgical History, Family History, and Social History were reviewed in [employer] record.  ROS  The following are not active complaints unless bolded Hoarseness, sore throat, dysphagia, dental problems, itching, sneezing,  nasal congestion or discharge of excess mucus or purulent secretions, ear ache,   fever, chills, sweats, unintended wt loss or wt gain, classically pleuritic or exertional cp,  orthopnea pnd or arm/hand swelling  or leg swelling, presyncope, palpitations, abdominal pain, anorexia, nausea, vomiting, diarrhea  or change in bowel habits or change in bladder habits, change in stools or change in urine, dysuria, hematuria,  rash, arthralgias, visual complaints, headache, numbness, weakness or ataxia or problems with walking or coordination,  change in mood or  memory.        Current Meds  Medication Sig   albuterol (PROVENTIL) (2.5 MG/3ML) 0.083% nebulizer solution Take 3 mLs (2.5 mg total) by nebulization every 6 (six) hours as needed for wheezing or shortness of breath.   albuterol (VENTOLIN HFA) 108 (90 Base) MCG/ACT inhaler Inhale 1-2 puffs into the lungs every 6 (six) hours as needed for shortness of breath or wheezing.   aspirin EC 325 MG tablet Take 325 mg by mouth daily.   busPIRone (BUSPAR) 5 MG tablet Take by mouth.    carvedilol (COREG) 25 MG tablet Take 25 mg by mouth 2 (two) times daily.   diltiazem (CARDIZEM CD) 180 MG 24 hr capsule Take 180 mg by mouth daily.   ezetimibe (ZETIA) 10 MG tablet Take 10 mg by mouth daily.   finasteride (PROSCAR) 5 MG tablet Take 5 mg by mouth daily.   Fluticasone-Umeclidin-Vilant (TRELEGY ELLIPTA) 100-62.5-25 MCG/ACT AEPB Inhale 1 puff into the lungs daily.   glipiZIDE (GLUCOTROL XL) 5 MG 24 hr tablet Take 5 mg by mouth daily.   hydrOXYzine (ATARAX) 10 MG tablet Take 1 tablet (10 mg total) by mouth every 8 (eight) hours as needed for anxiety.   isosorbide mononitrate (IMDUR) 60 MG 24 hr tablet Take 60 mg by mouth 2 (two) times daily.   levothyroxine (SYNTHROID) 88 MCG tablet Take 88 mcg by mouth every morning.   losartan-hydrochlorothiazide (HYZAAR) 100-12.5 MG tablet Take 1 tablet by mouth daily.  metFORMIN (GLUCOPHAGE-XR) 500 MG 24 hr tablet Take 500 mg by mouth 2 (two) times daily.   nabumetone (RELAFEN) 750 MG tablet Take 750 mg by mouth 2 (two) times daily.   nitroGLYCERIN (NITROSTAT) 0.4 MG SL tablet Place under the tongue.   pantoprazole (PROTONIX) 40 MG tablet Take 1 tablet (40 mg total) by mouth 2 (two) times daily.   rosuvastatin (CRESTOR) 5 MG tablet Take 5 mg by mouth daily.   tamsulosin (FLOMAX) 0.4 MG CAPS capsule Take 0.4 mg by mouth 2 (two) times daily.                              Past Medical History:  Diagnosis Date   CAD (coronary artery disease)    Cancer ([place])    Diabetes mellitus without complication ([place])    Hypertension    Stroke ([place])        Objective:    Wts  07/29/2022       234  05/05/2022       231   04/23/22 237 lb (107.5 kg)  02/03/22 239 lb (108.4 kg)  12/20/21 240 lb (108.9 kg)      Vital signs reviewed  07/29/2022  - Note at rest 02 sats  95% on RA   General appearance:    amb wm/ hesitant speech / difficult ambulation     HEENT : Oropharynx  clear  Nasal turbinates nl    NECK :  without  apparent JVD/  palpable Nodes/TM    LUNGS: no acc muscle use,  Min barrel  contour chest wall with bilateral  slightly decreased bs s audible wheeze and  without cough on insp or exp maneuvers and min  Hyperresonant  to  percussion bilaterally    CV:  RRR  no s3 or murmur or increase in P2, and no edema   ABD:  obese/pot belly contour but  soft and nontender with pos end  insp Hoover's  in the supine position.  No bruits or organomegaly appreciated   MS:  awkward gait/ ext warm without deformities Or obvious joint restrictions  calf tenderness, cyanosis or clubbing     SKIN: warm and dry without lesions    NEURO:  alert, approp, nl sensorium with  no motor or cerebellar deficits apparent.                        Assessment

## 2022-08-04 LAB — HEMOGLOBIN A1C: Hemoglobin A1C: 6.6

## 2022-08-04 LAB — LIPID PANEL
LDL Cholesterol: 142
Triglycerides: 163 — AB (ref 40–160)

## 2022-08-04 LAB — COMPREHENSIVE METABOLIC PANEL: eGFR: 79

## 2022-08-04 LAB — TSH: TSH: 3.96 (ref 0.41–5.90)

## 2022-10-20 ENCOUNTER — Telehealth: Payer: Self-pay | Admitting: Nurse Practitioner

## 2022-10-20 NOTE — Telephone Encounter (Signed)
Referral under [facility], Called pt lvm to sch

## 2022-10-21 NOTE — Telephone Encounter (Signed)
Left another VM 

## 2022-11-19 ENCOUNTER — Encounter: Payer: Self-pay | Admitting: Nurse Practitioner

## 2022-11-19 NOTE — Telephone Encounter (Signed)
See patient message

## 2022-12-02 ENCOUNTER — Ambulatory Visit: Payer: Medicare HMO | Admitting: Nurse Practitioner

## 2022-12-24 ENCOUNTER — Ambulatory Visit: Payer: Medicare HMO | Admitting: Nurse Practitioner

## 2023-01-21 ENCOUNTER — Encounter: Payer: Self-pay | Admitting: Nurse Practitioner

## 2023-01-23 ENCOUNTER — Other Ambulatory Visit: Payer: Self-pay

## 2023-01-23 MED ORDER — TRELEGY ELLIPTA 100-62.5-25 MCG/ACT IN AEPB: 1.0000 | INHALATION_SPRAY | Freq: Every day | RESPIRATORY_TRACT | 11 refills | Status: DC

## 2023-01-27 ENCOUNTER — Telehealth: Payer: Self-pay | Admitting: *Deleted

## 2023-01-27 MED ORDER — ALBUTEROL SULFATE (2.5 MG/3ML) 0.083% IN NEBU: 2.5000 mg | INHALATION_SOLUTION | Freq: Four times a day (QID) | RESPIRATORY_TRACT | 3 refills | Status: DC | PRN

## 2023-01-27 MED ORDER — TRELEGY ELLIPTA 100-62.5-25 MCG/ACT IN AEPB: 1.0000 | INHALATION_SPRAY | Freq: Every day | RESPIRATORY_TRACT | 3 refills | Status: DC

## 2023-01-27 MED ORDER — ALBUTEROL SULFATE HFA 108 (90 BASE) MCG/ACT IN AERS: 1.0000 | INHALATION_SPRAY | Freq: Four times a day (QID) | RESPIRATORY_TRACT | 3 refills | Status: DC | PRN

## 2023-01-27 NOTE — Telephone Encounter (Signed)
90 days supply of trelegy, ventolin hfa, and proventil sent to [facility]

## 2023-01-27 NOTE — Telephone Encounter (Signed)
Patient would like prescriptions (Trelegy, ventolin,and proventil) changed over from [place] to [place] ([facility]?)

## 2023-02-06 ENCOUNTER — Ambulatory Visit: Payer: Medicare PPO | Attending: Internal Medicine | Admitting: Internal Medicine

## 2023-02-06 ENCOUNTER — Encounter: Payer: Self-pay | Admitting: Internal Medicine

## 2023-02-06 VITALS — BP 154/72 | HR 69 | Ht 70.0 in | Wt 242.0 lb

## 2023-02-06 DIAGNOSIS — I5030 Unspecified diastolic (congestive) heart failure: Secondary | ICD-10-CM | POA: Insufficient documentation

## 2023-02-06 DIAGNOSIS — R0602 Shortness of breath: Secondary | ICD-10-CM

## 2023-02-06 MED ORDER — POTASSIUM CHLORIDE ER 10 MEQ PO TBCR
10.0000 meq | EXTENDED_RELEASE_TABLET | Freq: Every day | ORAL | 3 refills | Status: DC
Start: 2023-02-06 — End: 2023-02-10

## 2023-02-06 MED ORDER — FUROSEMIDE 20 MG PO TABS: 20.0000 mg | ORAL_TABLET | Freq: Every day | ORAL | 3 refills | Status: DC

## 2023-02-06 NOTE — Progress Notes (Signed)
Cardiology Office Note  Date: 02/06/2023   ID: Robert Brown, DOB May 18, 1951, MRN [id number]  PCP:  Leonie Douglas, MD  Cardiologist:  None Electrophysiologist:  None   Reason for Office Visit: Follow-up CAD   History of Present Illness: Robert Brown is a 72 y.o. male known to have CTO of distal RCA and distal LAD with normal LVEF (on medical management), HTN, DM 2, history of CVA presented to cardiology clinic for follow-up visit.  Patient underwent LHC in 2020 and 2022 for worsening chest pains which showed CTO of distal RCA and CTO of distal LAD with collateralization.  Medical management was recommended with aggressive antianginal therapy. Ranexa was cost prohibitive and hence is currently on BB, CCB and Imdur. He presented today for follow-up visit. Currently has 1-2 anginal episodes per month (after his PCP increased the diltiazem dose from 180 mg to 240 mg once daily). 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 His wife has mild dementia and had to help her with ADLs including lifting her every day to the bathroom and has no chest pains. He also has shortness of breath with exertion and leg swelling. He has some dizziness when he gets up from sitting position.  No dizziness with exertion. No syncope, palpitations.  Past Medical History:  Diagnosis Date   CAD (coronary artery disease)    Cancer ([place])    Diabetes mellitus without complication ([place])    Hypertension    Stroke [facility])     Past Surgical History:  Procedure Laterality Date   bone grafts     TUMOR EXCISION      Current Outpatient Medications  Medication Sig Dispense Refill   acyclovir (ZOVIRAX) 800 MG tablet SMARTSIG:1 Tablet(s) By Mouth     albuterol (PROVENTIL) (2.5 MG/3ML) 0.083% nebulizer solution Take 3 mLs (2.5 mg total) by nebulization every 6 (six) hours as needed for wheezing or shortness of breath. 225 mL 3   albuterol (VENTOLIN HFA) 108 (90 Base) MCG/ACT inhaler Inhale 1-2 puffs into the lungs every 6 (six) hours  as needed for shortness of breath or wheezing. 54 g 3   aspirin EC 325 MG tablet Take 325 mg by mouth daily.     busPIRone (BUSPAR) 5 MG tablet Take by mouth.     carvedilol (COREG) 25 MG tablet Take 25 mg by mouth 2 (two) times daily.     diltiazem (CARDIZEM CD) 240 MG 24 hr capsule Take 240 mg by mouth daily.     finasteride (PROSCAR) 5 MG tablet Take 5 mg by mouth daily.     Fluticasone-Umeclidin-Vilant (TRELEGY ELLIPTA) 100-62.5-25 MCG/ACT AEPB Inhale 1 puff into the lungs daily. 180 each 3   gabapentin (NEURONTIN) 300 MG capsule Take 300 mg by mouth 3 (three) times daily as needed.     isosorbide mononitrate (IMDUR) 120 MG 24 hr tablet Take by mouth.     levothyroxine (SYNTHROID) 88 MCG tablet Take 88 mcg by mouth every morning.     losartan (COZAAR) 100 MG tablet Take 100 mg by mouth daily.     metFORMIN (GLUCOPHAGE) 500 MG tablet Take 500 mg by mouth 2 (two) times daily.     nitroGLYCERIN (NITROSTAT) 0.4 MG SL tablet Place under the tongue.     ondansetron (ZOFRAN-ODT) 8 MG disintegrating tablet Take 8 mg by mouth every 8 (eight) hours as needed.     rosuvastatin (CRESTOR) 20 MG tablet Take 20 mg by mouth at bedtime.     tamsulosin (FLOMAX) 0.4 MG CAPS  capsule Take 0.4 mg by mouth 2 (two) times daily.     valACYclovir (VALTREX) 1000 MG tablet Take 1,000 mg by mouth every 8 (eight) hours.     Vitamin D, Ergocalciferol, (DRISDOL) 1.25 MG (50000 UNIT) CAPS capsule Take 50,000 Units by mouth once a week.     ezetimibe (ZETIA) 10 MG tablet Take 10 mg by mouth daily. (Patient not taking: Reported on 02/06/2023)     glipiZIDE (GLUCOTROL XL) 5 MG 24 hr tablet Take 5 mg by mouth daily. (Patient not taking: Reported on 02/06/2023)     hydrOXYzine (ATARAX) 10 MG tablet Take 1 tablet (10 mg total) by mouth every 8 (eight) hours as needed for anxiety. (Patient not taking: Reported on 02/06/2023) 30 tablet 0   losartan-hydrochlorothiazide (HYZAAR) 100-12.5 MG tablet Take 1 tablet by mouth daily. (Patient  not taking: Reported on 02/06/2023)     nabumetone (RELAFEN) 750 MG tablet Take 750 mg by mouth 2 (two) times daily. (Patient not taking: Reported on 02/06/2023)     pantoprazole (PROTONIX) 40 MG tablet Take 1 tablet (40 mg total) by mouth 2 (two) times daily. 60 tablet 1   No current facility-administered medications for this visit.   Allergies:  Lisinopril and Statins   Social History: The patient  reports that he quit smoking about 16 years ago. His smoking use included cigarettes. He has never used smokeless tobacco. He reports that he does not currently use alcohol. He reports that he does not use drugs.   Family History: The patient's family history includes Diabetes in his father and mother.   ROS:  Please see the history of present illness. Otherwise, complete review of systems is positive for none.  All other systems are reviewed and negative.   Physical Exam: VS:  Ht 5\' 10"  (1.778 m)   Wt 242 lb (109.8 kg)   BMI 34.72 kg/m , BMI Body mass index is 34.72 kg/m.  Wt Readings from Last 3 Encounters:  02/06/23 242 lb (109.8 kg)  07/29/22 234 lb (106.1 kg)  06/18/22 231 lb 3.2 oz (104.9 kg)    General: Patient appears comfortable at rest. HEENT: Conjunctiva and lids normal, oropharynx clear with moist mucosa. Neck: Supple, no elevated JVP or carotid bruits, no thyromegaly. Lungs: Clear to auscultation, nonlabored breathing at rest. Cardiac: Regular rate and rhythm, no S3 or significant systolic murmur, no pericardial rub. Abdomen: Soft, nontender, no hepatomegaly, bowel sounds present, no guarding or rebound. Extremities: 1+ pitting edema in bilateral lower extremities, distal pulses 2+. Skin: Warm and dry. Musculoskeletal: No kyphosis. Neuropsychiatric: Alert and oriented x3, affect grossly appropriate.  ECG: NSR  Recent Labwork: No results found for requested labs within last 365 days.  No results found for: "CHOL", "TRIG", "HDL", "CHOLHDL", "VLDL", "LDLCALC",  "LDLDIRECT"  Other Studies Reviewed Today: LHC in 2022 at [facility] LAD lesion is 100% stenosed.   Prox LAD to Mid LAD lesion is 20% stenosed.   Mid RCA to Dist RCA lesion is 100% stenosed.  The angiogram was most notable for chronic occlusion of the distal/apical  LAD and distal RCA.  There is otherwise scattered mild, nonocclusive  disease of the circumflex and proximal LAD.  Findings are unchanged from  the previous study 2 years ago.  Pursue aggressive medical therapy for angina prophylaxis and secondary  prevention.   Echocardiogram in 09/2021 Left Ventricle: Systolic function is normal. EF: 60-65%.    Left Ventricle: Doppler parameters consistent with moderate diastolic  dysfunction and elevated LA  pressure.    Left Atrium: Left atrium is mildly dilated.    Mitral Valve: There is mild regurgitation.    Aortic Valve: Trace to mild aortic valve regurgitation   Assessment and Plan: Patient is a 72 year old M known to have CTO of distal RCA and distal LAD with normal LVEF (on medical management), HTN, DM 2, history of CVA presented to cardiology clinic for follow-up visit.  # HFpEF, rule out HFrEF -Patient has DOE and leg swelling, start Lasix 20 mg once daily with potassium supplements. BMP in 5 days. Obtain 2D echocardiogram.  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 Continue current antianginal therapy, diltiazem to 40 mg once daily, carvedilol 25 mg twice daily, Imdur 120 mg once daily. -Continue aspirin 325 mg once daily (unclear why he is on the higher dose but since his anginal episodes are stable, will continue) -SL NTG 0.4 mg. -ER precautions for chest pain  # HLD -Continue rosuvastatin 20 mg nightly  # HTN, controlled -Continue antihypertensive medications as stated above and continue losartan 100 mg once daily.  I have spent a total of 33 minutes with patient  reviewing chart, EKGs, labs and examining patient as well as establishing an assessment and plan that was discussed with the patient.  > 50% of time was spent in direct patient care.     Medication Adjustments/Labs and Tests Ordered: Current medicines are reviewed at length with the patient today.  Concerns regarding medicines are outlined above.   Tests Ordered: Orders Placed This Encounter  Procedures   Basic Metabolic Panel (BMET)   EKG 12-Lead   ECHOCARDIOGRAM COMPLETE     Medication Changes: Meds ordered this encounter  Medications   furosemide (LASIX) 20 MG tablet    Sig: Take 1 tablet (20 mg total) by mouth daily.    Dispense:  90 tablet    Refill:  3   potassium chloride (KLOR-CON) 10 MEQ tablet    Sig: Take 1 tablet (10 mEq total) by mouth daily.    Dispense:  90 tablet    Refill:  3      Disposition:  Follow up  6 months  Signed, Myron Stankovich Fidel Levy, MD, 02/06/2023 3:25 PM    [facility] at [facility] [address] [place], [place] [address]

## 2023-02-06 NOTE — Patient Instructions (Signed)
Medication Instructions:   Start Lasix 20 mg once Daily  Start Potasium 10 mEq once Daily   *If you need a refill on your cardiac medications before your next appointment, please call your pharmacy*   Lab Work: Your physician recommends that you return for lab work in: 5 Days ( BMET)   If you have labs (blood work) drawn today and your tests are completely normal, you will receive your results only by: [place] (if you have MyChart) OR A paper copy in the mail If you have any lab test that is abnormal or we need to change your treatment, we will call you to review the results.   Testing/Procedures: Your physician has requested that you have an echocardiogram. Echocardiography is a painless test that uses sound waves to create images of your heart. It provides your doctor with information about the size and shape of your heart and how well your heart's chambers and valves are working. This procedure takes approximately one hour. There are no restrictions for this procedure. Please do NOT wear cologne, perfume, aftershave, or lotions (deodorant is allowed). Please arrive 15 minutes prior to your appointment time.    Follow-Up: At [facility], you and your health needs are our priority.  As part of our continuing mission to provide you with exceptional heart care, we have created designated Provider Care Teams.  These Care Teams include your primary Cardiologist (physician) and Advanced Practice Providers (APPs -  Physician Assistants and Nurse Practitioners) who all work together to provide you with the care you need, when you need it.  We recommend signing up for the patient portal called "MyChart".  Sign up information is provided on this After Visit Summary.  MyChart is used to connect with patients for Virtual Visits (Telemedicine).  Patients are able to view lab/test results, encounter notes, upcoming appointments, etc.  Non-urgent messages can be sent to your provider  as well.   To learn more about what you can do with MyChart, go to [email or web address].    Your next appointment:   6 month(s)  Provider:   Claudina Lick, MD    Other Instructions Thank you for choosing [facility]!

## 2023-02-10 ENCOUNTER — Ambulatory Visit (INDEPENDENT_AMBULATORY_CARE_PROVIDER_SITE_OTHER): Payer: Medicare PPO | Admitting: Nurse Practitioner

## 2023-02-10 ENCOUNTER — Encounter: Payer: Self-pay | Admitting: Nurse Practitioner

## 2023-02-10 VITALS — BP 150/80 | HR 68 | Ht 70.5 in | Wt 227.0 lb

## 2023-02-10 DIAGNOSIS — E039 Hypothyroidism, unspecified: Secondary | ICD-10-CM

## 2023-02-10 DIAGNOSIS — E119 Type 2 diabetes mellitus without complications: Secondary | ICD-10-CM | POA: Diagnosis not present

## 2023-02-10 LAB — POCT GLYCOSYLATED HEMOGLOBIN (HGB A1C): Hemoglobin A1C: 7.6 % — AB (ref 4.0–5.6)

## 2023-02-10 MED ORDER — TIRZEPATIDE 2.5 MG/0.5ML ~~LOC~~ SOAJ: 2.5000 mg | SUBCUTANEOUS | 0 refills | Status: DC

## 2023-02-10 MED ORDER — ACCU-CHEK GUIDE W/DEVICE KIT: PACK | 0 refills | Status: DC

## 2023-02-10 MED ORDER — TIRZEPATIDE 5 MG/0.5ML ~~LOC~~ SOAJ: 5.0000 mg | SUBCUTANEOUS | 3 refills | Status: DC

## 2023-02-10 NOTE — Patient Instructions (Signed)

## 2023-02-10 NOTE — Progress Notes (Signed)
Endocrinology Consult Note       02/10/2023, 4:09 PM   Subjective:    Patient ID: Robert Brown, male    DOB: [birth year].  Calen Anglade is being seen in consultation for management of currently uncontrolled symptomatic diabetes requested by  Leonie Douglas, MD.   Past Medical History:  Diagnosis Date   CAD (coronary artery disease)    Cancer ([place])    Diabetes mellitus without complication ([place])    Hypertension    Stroke [facility])     Past Surgical History:  Procedure Laterality Date   bone grafts     TUMOR EXCISION      Social History   Socioeconomic History   Marital status: Married    Spouse name: Not on file   Number of children: Not on file   Years of education: Not on file   Highest education level: Not on file  Occupational History   Not on file  Tobacco Use   Smoking status: Former    Types: Cigarettes    Quit date: 11/17/2006    Years since quitting: 16.2   Smokeless tobacco: Never  Vaping Use   Vaping Use: Never used  Substance and Sexual Activity   Alcohol use: Not Currently    Comment: former drinker   Drug use: Never   Sexual activity: Not Currently  Other Topics Concern   Not on file  Social History Narrative   Not on file   Social Determinants of Health   Financial Resource Strain: Not on file  Food Insecurity: Not on file  Transportation Needs: Not on file  Physical Activity: Not on file  Stress: Not on file  Social Connections: Not on file    Family History  Problem Relation Age of Onset   Diabetes Mother    Diabetes Father     Outpatient Encounter Medications as of 02/10/2023  Medication Sig   acyclovir (ZOVIRAX) 800 MG tablet SMARTSIG:1 Tablet(s) By Mouth   albuterol (PROVENTIL) (2.5 MG/3ML) 0.083% nebulizer solution Take 3 mLs (2.5 mg total) by nebulization every 6 (six) hours as needed for wheezing or shortness of breath.   albuterol (VENTOLIN HFA) 108 (90  Base) MCG/ACT inhaler Inhale 1-2 puffs into the lungs every 6 (six) hours as needed for shortness of breath or wheezing.   aspirin EC 325 MG tablet Take 325 mg by mouth daily.   Blood Glucose Monitoring Suppl (ACCU-CHEK GUIDE) w/Device KIT Use to check glucose once daily.   busPIRone (BUSPAR) 5 MG tablet Take by mouth.   carvedilol (COREG) 25 MG tablet Take 25 mg by mouth 2 (two) times daily.   diltiazem (CARDIZEM CD) 240 MG 24 hr capsule Take 240 mg by mouth daily.   finasteride (PROSCAR) 5 MG tablet Take 5 mg by mouth daily.   Fluticasone-Umeclidin-Vilant (TRELEGY ELLIPTA) 100-62.5-25 MCG/ACT AEPB Inhale 1 puff into the lungs daily.   furosemide (LASIX) 20 MG tablet Take 20 mg by mouth daily.   gabapentin (NEURONTIN) 300 MG capsule Take 300 mg by mouth 3 (three) times daily as needed.   isosorbide mononitrate (IMDUR) 120 MG 24 hr tablet Take by mouth.   levothyroxine (SYNTHROID) 88 MCG tablet Take 88 mcg by  mouth every morning.   losartan (COZAAR) 100 MG tablet Take 100 mg by mouth daily.   metFORMIN (GLUCOPHAGE) 500 MG tablet Take 500 mg by mouth 2 (two) times daily.   nitroGLYCERIN (NITROSTAT) 0.4 MG SL tablet Place under the tongue.   ondansetron (ZOFRAN-ODT) 8 MG disintegrating tablet Take 8 mg by mouth every 8 (eight) hours as needed.   potassium chloride (KLOR-CON M) 10 MEQ tablet Take 10 mEq by mouth daily.   rosuvastatin (CRESTOR) 20 MG tablet Take 20 mg by mouth at bedtime.   tamsulosin (FLOMAX) 0.4 MG CAPS capsule Take 0.4 mg by mouth 2 (two) times daily.   tirzepatide [facility]) 2.5 MG/0.5ML Pen Inject 2.5 mg into the skin once a week.   tirzepatide [facility]) 5 MG/0.5ML Pen Inject 5 mg into the skin once a week.   valACYclovir (VALTREX) 1000 MG tablet Take 1,000 mg by mouth every 8 (eight) hours.   Vitamin D, Ergocalciferol, (DRISDOL) 1.25 MG (50000 UNIT) CAPS capsule Take 50,000 Units by mouth once a week.   pantoprazole (PROTONIX) 40 MG tablet Take 1 tablet (40 mg total) by  mouth 2 (two) times daily.   [DISCONTINUED] ezetimibe (ZETIA) 10 MG tablet Take 10 mg by mouth daily. (Patient not taking: Reported on 02/06/2023)   [DISCONTINUED] furosemide (LASIX) 20 MG tablet Take 1 tablet (20 mg total) by mouth daily. (Patient not taking: Reported on 02/10/2023)   [DISCONTINUED] glipiZIDE (GLUCOTROL XL) 5 MG 24 hr tablet Take 5 mg by mouth daily. (Patient not taking: Reported on 02/06/2023)   [DISCONTINUED] hydrOXYzine (ATARAX) 10 MG tablet Take 1 tablet (10 mg total) by mouth every 8 (eight) hours as needed for anxiety. (Patient not taking: Reported on 02/06/2023)   [DISCONTINUED] losartan-hydrochlorothiazide (HYZAAR) 100-12.5 MG tablet Take 1 tablet by mouth daily. (Patient not taking: Reported on 02/06/2023)   [DISCONTINUED] nabumetone (RELAFEN) 750 MG tablet Take 750 mg by mouth 2 (two) times daily. (Patient not taking: Reported on 02/06/2023)   [DISCONTINUED] potassium chloride (KLOR-CON) 10 MEQ tablet Take 1 tablet (10 mEq total) by mouth daily. (Patient not taking: Reported on 02/10/2023)   No facility-administered encounter medications on file as of 02/10/2023.    ALLERGIES: Allergies  Allergen Reactions   Lisinopril     Medicines ending in -pril   Statins     VACCINATION STATUS: Immunization History  Administered Date(s) Administered   Fluad Quad(high Dose 65+) 09/10/2020   Influenza, High Dose Seasonal PF 09/06/2018, 10/11/2019, 08/28/2021   PFIZER Comirnaty(Gray Top)Covid-19 Tri-Sucrose Vaccine 03/12/2020, 04/06/2020, 10/22/2020, 04/01/2021   PFIZER(Purple Top)SARS-COV-2 Vaccination 03/12/2020, 04/06/2020, 10/22/2020   Pfizer Covid-19 Vaccine Bivalent Booster 62yrs & up 09/04/2021   Pneumococcal Conjugate-13 06/18/2015   Pneumococcal Polysaccharide-23 06/23/2017, 06/23/2017   Tdap 02/22/2020    Diabetes He presents for his initial diabetic visit. He has type 2 diabetes mellitus. His disease course has been fluctuating. There are no hypoglycemic associated  symptoms. Associated symptoms include fatigue and polyuria. There are no hypoglycemic complications. Diabetic complications include a CVA, heart disease (CAD and CHF), nephropathy and peripheral neuropathy. Risk factors for coronary artery disease include diabetes mellitus, dyslipidemia, family history, obesity, male sex, hypertension and sedentary lifestyle. Current diabetic treatment includes oral agent (monotherapy). He is compliant with treatment most of the time. His weight is fluctuating minimally. He is following a generally unhealthy diet. When asked about meal planning, he reported none. He has not had a previous visit with a dietitian. He rarely participates in exercise. (He presents today for his consultation, with no  meter or logs to review.  He does not currently have working meter.  His POCT A1c today is 7.6%, increasing from recent A1c of 7%.  He drinks coffee with milk and sugar, water, and peach iced tea.  He eats 2 meals per day (skipping lunch most days), says he is trying to lose weight.  He does not exercise optimally other than helping his disabled wife.  He is UTD on eye exam, has not been seen by podiatry in the past.  He notes he stopped his Glipizide due to significant low glucose readings in the past.) An ACE inhibitor/angiotensin II receptor blocker is being taken. He does not see a podiatrist.Eye exam is current.     Review of systems  Constitutional: + Minimally fluctuating body weight, current Body mass index is 32.11 kg/m., no fatigue, no subjective hyperthermia, no subjective hypothermia Eyes: no blurry vision, no xerophthalmia ENT: no sore throat, no nodules palpated in throat, no dysphagia/odynophagia, no hoarseness Cardiovascular: no chest pain, no shortness of breath, no palpitations, no leg swelling Respiratory: no cough, no shortness of breath Gastrointestinal: no nausea/vomiting/diarrhea Musculoskeletal: no muscle/joint aches Skin: no rashes, no  hyperemia Neurological: no tremors, no numbness, no tingling, no dizziness Psychiatric: no depression, no anxiety  Objective:     BP (!) 150/80 (BP Location: Right Arm, Patient Position: Sitting, Cuff Size: Large) Comment: Retake Manuel Cuff  Pulse 68   Ht 5' 10.5" (1.791 m)   Wt 227 lb (103 kg)   BMI 32.11 kg/m   Wt Readings from Last 3 Encounters:  02/10/23 227 lb (103 kg)  02/06/23 242 lb (109.8 kg)  07/29/22 234 lb (106.1 kg)     BP Readings from Last 3 Encounters:  02/10/23 (!) 150/80  02/06/23 (!) 154/72  07/29/22 130/84     Physical Exam- Limited  Constitutional:  Body mass index is 32.11 kg/m. , not in acute distress, normal state of mind Eyes:  EOMI, no exophthalmos Neck: Supple Cardiovascular: RRR, no murmurs, rubs, or gallops, no edema Respiratory: Adequate breathing efforts, no crackles, rales, rhonchi, or wheezing Musculoskeletal: no gross deformities, strength intact in all four extremities, no gross restriction of joint movements Skin:  no rashes, no hyperemia Neurological: no tremor with outstretched hands    CMP ( most recent) CMP     Component Value Date/Time   NA 141 12/23/2021 0452   K 3.2 (L) 12/23/2021 0452   CL 102 12/23/2021 0452   CO2 32 12/23/2021 0452   GLUCOSE 119 (H) 12/23/2021 0452   BUN 22 12/23/2021 0452   CREATININE 0.93 12/23/2021 0452   CALCIUM 8.5 (L) 12/23/2021 0452   PROT 7.6 12/20/2021 1412   ALBUMIN 3.8 12/20/2021 1412   AST 20 12/20/2021 1412   ALT 23 12/20/2021 1412   ALKPHOS 85 12/20/2021 1412   BILITOT 0.6 12/20/2021 1412   GFRNONAA >60 12/23/2021 0452   GFRAA >60 06/21/2020 1510     Diabetic Labs (most recent): Lab Results  Component Value Date   HGBA1C 7.6 (A) 02/10/2023   HGBA1C 6.6 08/04/2022   HGBA1C 7.0 (H) 12/20/2021     Lipid Panel ( most recent) Lipid Panel     Component Value Date/Time   TRIG 163 (A) 08/04/2022 0000   LDLCALC 142 08/04/2022 0000      Lab Results  Component Value Date    TSH 3.96 08/04/2022           Assessment & Plan:   1) Type 2 Diabetes mellitus without complication  without long-term current use of insulin  He presents today for his consultation, with no meter or logs to review.  He does not currently have working meter.  His POCT A1c today is 7.6%, increasing from recent A1c of 7%.  He drinks coffee with milk and sugar, water, and peach iced tea.  He eats 2 meals per day (skipping lunch most days), says he is trying to lose weight.  He does not exercise optimally other than helping his disabled wife.  He is UTD on eye exam, has not been seen by podiatry in the past.  He notes he stopped his Glipizide due to significant low glucose readings in the past.  - Justice Sawaya has currently uncontrolled symptomatic type 2 DM since 72 years of age, with most recent A1c of 7.6 %.   -Recent labs reviewed.  - I had a long discussion with him about the progressive nature of diabetes and the pathology behind its complications. -his diabetes is complicated by CAD with MI, CVA, CKD stage 2, neuropathy and he remains at a high risk for more acute and chronic complications which include CAD, CVA, CKD, retinopathy, and neuropathy. These are all discussed in detail with him.  The following Lifestyle Medicine recommendations according to [facility]) were discussed and offered to patient and he agrees to start the journey:  A. Whole Foods, Plant-based plate comprising of fruits and vegetables, plant-based proteins, whole-grain carbohydrates was discussed in detail with the patient.   A list for source of those nutrients were also provided to the patient.  Patient will use only water or unsweetened tea for hydration. B.  The need to stay away from risky substances including alcohol, smoking; obtaining 7 to 9 hours of restorative sleep, at least 150 minutes of moderate intensity exercise weekly, the importance of healthy social connections,  and  stress reduction techniques were discussed. C.  A full color page of  Calorie density of various food groups per pound showing examples of each food groups was provided to the patient.  - I have counseled him on diet and weight management by adopting a carbohydrate restricted/protein rich diet. Patient is encouraged to switch to unprocessed or minimally processed complex starch and increased protein intake (animal or plant source), fruits, and vegetables. -  he is advised to stick to a routine mealtimes to eat 3 meals a day and avoid unnecessary snacks (to snack only to correct hypoglycemia).   - he acknowledges that there is a room for improvement in his food and drink choices. - Suggestion is made for him to avoid simple carbohydrates from his diet including Cakes, Sweet Desserts, Ice Cream, Soda (diet and regular), Sweet Tea, Candies, Chips, Cookies, Store Bought Juices, Alcohol in Excess of 1-2 drinks a day, Artificial Sweeteners, Coffee Creamer, and "Sugar-free" Products. This will help patient to have more stable blood glucose profile and potentially avoid unintended weight gain.  - I have approached him with the following individualized plan to manage his diabetes and patient agrees:   -He is advised to continue his Metformin 500 mg po twice daily with meals.  Will start him on Mounjaro 2.5 mg SQ weekly x 1 month, then increase to 5 mg SQ weekly thereafter if tolerated well.  He could most certainly benefit from GLP1 product given his extensive CAD history with MI and CVA in the past.  -he is encouraged to start monitoring glucose once daily daily, before breakfast, and to call the clinic if  he had readings less than 70 or above 300 for 3 tests in a row.  - he is warned not to take insulin without proper monitoring per orders. - Adjustment parameters are given to him for hypo and hyperglycemia in writing.  - he is not a candidate for full dose Metformin due to concurrent renal  insufficiency.  - Specific targets for  A1c; LDL, HDL, and Triglycerides were discussed with the patient.  2) Blood Pressure /Hypertension:  his blood pressure is NOT controlled to target.   he is advised to continue his current medications including Losartan 100 mg po daily, Lasix 20 mg po daily,  Imdur 120 mg p.o. daily with breakfast, Coreg 25 mg po twice daily, and Diltiazem 240 mg po daily.  3) Lipids/Hyperlipidemia:    Review of his recent lipid panel from 01/28/23 showed uncontrolled LDL at 126 .  he is advised to continue Crestor 20 mg daily at bedtime.  Side effects and precautions discussed with him.  4)  Weight/Diet:  his Body mass index is 32.11 kg/m.  -  clearly complicating his diabetes care.   he is a candidate for weight loss. I discussed with him the fact that loss of 5 - 10% of his  current body weight will have the most impact on his diabetes management.  Exercise, and detailed carbohydrates information provided  -  detailed on discharge instructions.  5) Hypothyroidism-unspecified The details surrounding his diagnosis are not available.  He denies any known family history of thyroid problems.  He is currently on Levothyroxine 88 mcg po daily but notes he does take it with his other morning meds.  We discussed proper administration today.  - The correct intake of thyroid hormone (Levothyroxine, Synthroid), is on empty stomach first thing in the morning, with water, separated by at least 30 minutes from breakfast and other medications,  and separated by more than 4 hours from calcium, iron, multivitamins, acid reflux medications (PPIs).  - This medication is a life-long medication and will be needed to correct thyroid hormone imbalances for the rest of your life.  The dose may change from time to time, based on thyroid blood work.  - It is extremely important to be consistent taking this medication, near the same time each morning.  -AVOID TAKING PRODUCTS CONTAINING BIOTIN  (commonly found in Hair, Skin, Nails vitamins) AS IT INTERFERES WITH THE VALIDITY OF THYROID FUNCTION BLOOD TESTS.  6) Chronic Care/Health Maintenance: -he is on ACEI/ARB and Statin medications and is encouraged to initiate and continue to follow up with Ophthalmology, Dentist, Podiatrist at least yearly or according to recommendations, and advised to stay away from smoking. I have recommended yearly flu vaccine and pneumonia vaccine at least every 5 years; moderate intensity exercise for up to 150 minutes weekly; and sleep for at least 7 hours a day.  - he is advised to maintain close follow up with Leonie Douglas, MD for primary care needs, as well as his other providers for optimal and coordinated care.   - Time spent in this patient care: 60 min, of which > 50% was spent in counseling him about his diabetes and the rest reviewing his blood glucose logs, discussing his hypoglycemia and hyperglycemia episodes, reviewing his current and previous labs/studies (including abstraction from other facilities) and medications doses and developing a long term treatment plan based on the latest standards of care/guidelines; and documenting his care.    Please refer to Patient Instructions for Blood Glucose Monitoring and Insulin/Medications  Dosing Guide" in media tab for additional information. Please also refer to "Patient Self Inventory" in the Media tab for reviewed elements of pertinent patient history.  [name] participated in the discussions, expressed understanding, and voiced agreement with the above plans.  All questions were answered to his satisfaction. he is encouraged to contact clinic should he have any questions or concerns prior to his return visit.     Follow up plan: - Return in about 3 months (around 05/13/2023) for Diabetes F/U with A1c in office, Bring meter and logs, Thyroid follow up, Previsit labs.    Rayetta Pigg, [facility] [address] [place], [place] [address] Phone: [phone] Fax: [phone]  02/10/2023, 4:09 PM

## 2023-02-23 ENCOUNTER — Ambulatory Visit (HOSPITAL_COMMUNITY): Admission: RE | Admit: 2023-02-23 | Payer: Medicare PPO | Source: Ambulatory Visit

## 2023-03-02 ENCOUNTER — Encounter: Payer: Self-pay | Admitting: Nurse Practitioner

## 2023-03-02 MED ORDER — TIRZEPATIDE 5 MG/0.5ML ~~LOC~~ SOAJ: 5.0000 mg | SUBCUTANEOUS | 3 refills | Status: DC

## 2023-03-09 ENCOUNTER — Telehealth: Payer: Self-pay | Admitting: *Deleted

## 2023-03-09 NOTE — Telephone Encounter (Signed)
Can you call the patient and inquire about his recent message?  I am confused about what he is meaning.  Does he want me to send in for the 2.5 mg dose?  We can always try the Ozempic, they offer patient assistance programs to help cover high costs.

## 2023-03-09 NOTE — Telephone Encounter (Signed)
A note was sent to Robert Brown to send in a prescription for the Ozempic and the patient will come by the office tomorrow and pick it up , once completed he will bring it back and we will add our part to it and fax in for the patient.

## 2023-03-09 NOTE — Telephone Encounter (Signed)
Robert Brown - patient would like for you to send a RX for the Ozempic and he will come by and pick up a application for the assistance program.

## 2023-03-10 MED ORDER — OZEMPIC (0.25 OR 0.5 MG/DOSE) 2 MG/3ML ~~LOC~~ SOPN: 0.5000 mg | PEN_INJECTOR | SUBCUTANEOUS | 3 refills | Status: DC

## 2023-03-10 NOTE — Telephone Encounter (Signed)
Patient was called and a message was left with this information as noted below.

## 2023-03-10 NOTE — Telephone Encounter (Signed)
I have placed an order and sent it to [facility].  If too expensive, the patient assistance program is an alternative way to get it for cheaper if he qualifies.

## 2023-03-10 NOTE — Addendum Note (Signed)
Addended by: Dani Gobble on: 03/10/2023 07:46 AM   Modules accepted: Orders

## 2023-04-17 ENCOUNTER — Telehealth: Payer: Self-pay | Admitting: Nurse Practitioner

## 2023-04-17 NOTE — Telephone Encounter (Signed)
Pt has patient assistance in the fridge. Pt will pick up monday

## 2023-05-06 ENCOUNTER — Other Ambulatory Visit (HOSPITAL_COMMUNITY): Payer: Self-pay | Admitting: Gerontology

## 2023-05-06 ENCOUNTER — Ambulatory Visit (HOSPITAL_COMMUNITY)
Admission: RE | Admit: 2023-05-06 | Discharge: 2023-05-06 | Disposition: A | Discharge status: Home / Self Care | Payer: Medicare PPO | Source: Ambulatory Visit | Attending: Gerontology | Admitting: Gerontology

## 2023-05-06 DIAGNOSIS — M545 Low back pain, unspecified: Secondary | ICD-10-CM | POA: Diagnosis present

## 2023-05-12 ENCOUNTER — Ambulatory Visit (HOSPITAL_COMMUNITY): Admission: RE | Admit: 2023-05-12 | Payer: Medicare PPO | Source: Ambulatory Visit

## 2023-05-26 ENCOUNTER — Other Ambulatory Visit: Payer: Self-pay | Admitting: Nurse Practitioner

## 2023-05-28 ENCOUNTER — Encounter: Payer: Self-pay | Admitting: Nurse Practitioner

## 2023-05-28 ENCOUNTER — Ambulatory Visit (INDEPENDENT_AMBULATORY_CARE_PROVIDER_SITE_OTHER): Payer: Medicare PPO | Admitting: Nurse Practitioner

## 2023-05-28 VITALS — BP 150/78 | HR 80 | Ht 70.5 in | Wt 219.0 lb

## 2023-05-28 DIAGNOSIS — Z7984 Long term (current) use of oral hypoglycemic drugs: Secondary | ICD-10-CM

## 2023-05-28 DIAGNOSIS — E119 Type 2 diabetes mellitus without complications: Secondary | ICD-10-CM | POA: Diagnosis not present

## 2023-05-28 DIAGNOSIS — E785 Hyperlipidemia, unspecified: Secondary | ICD-10-CM

## 2023-05-28 DIAGNOSIS — Z7985 Long-term (current) use of injectable non-insulin antidiabetic drugs: Secondary | ICD-10-CM | POA: Diagnosis not present

## 2023-05-28 DIAGNOSIS — E559 Vitamin D deficiency, unspecified: Secondary | ICD-10-CM | POA: Diagnosis not present

## 2023-05-28 DIAGNOSIS — E039 Hypothyroidism, unspecified: Secondary | ICD-10-CM

## 2023-05-28 DIAGNOSIS — I10 Essential (primary) hypertension: Secondary | ICD-10-CM

## 2023-05-28 LAB — POCT GLYCOSYLATED HEMOGLOBIN (HGB A1C): Hemoglobin A1C: 6.8 % — AB (ref 4.0–5.6)

## 2023-05-28 MED ORDER — LEVOTHYROXINE SODIUM 88 MCG PO TABS: 88.0000 ug | ORAL_TABLET | Freq: Every morning | ORAL | 1 refills | Status: DC

## 2023-05-28 NOTE — Progress Notes (Signed)
Endocrinology Follow Up Note       05/29/2023, 7:11 AM   Subjective:    Patient ID: Robert Brown, male    DOB: 08-24-1951.  Robert Brown is being seen in follow up after being seen in consultation for management of currently uncontrolled symptomatic diabetes requested by  Waldon Reining, MD.   Past Medical History:  Diagnosis Date   CAD (coronary artery disease)    Cancer (HCC)    Diabetes mellitus without complication (HCC)    Hypertension    Stroke [facility])     Past Surgical History:  Procedure Laterality Date   bone grafts     TUMOR EXCISION      Social History   Socioeconomic History   Marital status: Married    Spouse name: Not on file   Number of children: Not on file   Years of education: Not on file   Highest education level: Not on file  Occupational History   Not on file  Tobacco Use   Smoking status: Former    Current packs/day: 0.00    Types: Cigarettes    Quit date: 11/17/2006    Years since quitting: 16.5   Smokeless tobacco: Never  Vaping Use   Vaping status: Never Used  Substance and Sexual Activity   Alcohol use: Not Currently    Comment: former drinker   Drug use: Never   Sexual activity: Not Currently  Other Topics Concern   Not on file  Social History Narrative   Not on file   Social Determinants of Health   Financial Resource Strain: High Risk (02/27/2022)   Received from [facility]   Overall Financial Resource Strain (CARDIA)    Difficulty of Paying Living Expenses: Hard  Food Insecurity: Food Insecurity Present (02/27/2022)   Received from [facility]   Hunger Vital Sign    Worried About Running Out of Food in the Last Year: Sometimes true    Ran Out of Food in the Last Year: Never true  Transportation Needs: Patient Declined (02/27/2022)   Received from [facility] - Transportation    Lack of Transportation (Medical): Patient declined    Lack  of Transportation (Non-Medical): Patient declined  Physical Activity: Insufficiently Active (02/27/2022)   Received from [facility]   Exercise Vital Sign    Days of Exercise per Week: 1 day    Minutes of Exercise per Session: 60 min  Stress: Stress Concern Present (02/27/2022)   Received from [facility] - Occupational Stress Questionnaire    Feeling of Stress : To some extent  Social Connections: Unknown (03/01/2023)   Received from [facility]   Social Network    Social Network: Not on file    Family History  Problem Relation Age of Onset   Diabetes Mother    Diabetes Father     Outpatient Encounter Medications as of 05/28/2023  Medication Sig   acyclovir (ZOVIRAX) 800 MG tablet SMARTSIG:1 Tablet(s) By Mouth   albuterol (PROVENTIL) (2.5 MG/3ML) 0.083% nebulizer solution Take 3 mLs (2.5 mg total) by nebulization every 6 (six) hours as needed for wheezing or shortness of breath.  albuterol (VENTOLIN HFA) 108 (90 Base) MCG/ACT inhaler Inhale 1-2 puffs into the lungs every 6 (six) hours as needed for shortness of breath or wheezing.   aspirin EC 325 MG tablet Take 325 mg by mouth daily.   Blood Glucose Monitoring Suppl (ACCU-CHEK GUIDE) w/Device KIT Use to check glucose once daily.   busPIRone (BUSPAR) 5 MG tablet Take by mouth.   carvedilol (COREG) 25 MG tablet Take 25 mg by mouth 2 (two) times daily.   diltiazem (CARDIZEM CD) 240 MG 24 hr capsule Take 240 mg by mouth daily.   finasteride (PROSCAR) 5 MG tablet Take 5 mg by mouth daily.   Fluticasone-Umeclidin-Vilant (TRELEGY ELLIPTA) 100-62.5-25 MCG/ACT AEPB Inhale 1 puff into the lungs daily.   furosemide (LASIX) 20 MG tablet Take 20 mg by mouth daily.   gabapentin (NEURONTIN) 300 MG capsule Take 300 mg by mouth 3 (three) times daily as needed.   isosorbide mononitrate (IMDUR) 120 MG 24 hr tablet Take by mouth.   losartan (COZAAR) 100 MG tablet Take 100 mg by mouth daily.    metFORMIN (GLUCOPHAGE) 500 MG tablet Take 500 mg by mouth 2 (two) times daily.   nitroGLYCERIN (NITROSTAT) 0.4 MG SL tablet Place under the tongue.   ondansetron (ZOFRAN-ODT) 8 MG disintegrating tablet Take 8 mg by mouth every 8 (eight) hours as needed.   potassium chloride (KLOR-CON M) 10 MEQ tablet Take 10 mEq by mouth daily.   rosuvastatin (CRESTOR) 20 MG tablet Take 20 mg by mouth at bedtime.   Semaglutide,0.25 or 0.5MG /DOS, (OZEMPIC, 0.25 OR 0.5 MG/DOSE,) 2 MG/3ML SOPN Inject 0.5 mg into the skin once a week.   tamsulosin (FLOMAX) 0.4 MG CAPS capsule Take 0.4 mg by mouth 2 (two) times daily.   valACYclovir (VALTREX) 1000 MG tablet Take 1,000 mg by mouth every 8 (eight) hours.   Vitamin D, Ergocalciferol, (DRISDOL) 1.25 MG (50000 UNIT) CAPS capsule Take 50,000 Units by mouth once a week.   [DISCONTINUED] levothyroxine (SYNTHROID) 88 MCG tablet Take 88 mcg by mouth every morning.   levothyroxine (SYNTHROID) 88 MCG tablet Take 1 tablet (88 mcg total) by mouth every morning.   pantoprazole (PROTONIX) 40 MG tablet Take 1 tablet (40 mg total) by mouth 2 (two) times daily.   No facility-administered encounter medications on file as of 05/28/2023.    ALLERGIES: Allergies  Allergen Reactions   Lisinopril     Medicines ending in -pril   Statins     VACCINATION STATUS: Immunization History  Administered Date(s) Administered   Fluad Quad(high Dose 65+) 09/10/2020   Influenza, High Dose Seasonal PF 09/06/2018, 10/11/2019, 08/28/2021   PFIZER Comirnaty(Gray Top)Covid-19 Tri-Sucrose Vaccine 03/12/2020, 04/06/2020, 10/22/2020, 04/01/2021   PFIZER(Purple Top)SARS-COV-2 Vaccination 03/12/2020, 04/06/2020, 10/22/2020   Pfizer Covid-19 Vaccine Bivalent Booster 70yrs & up 09/04/2021   Pneumococcal Conjugate-13 06/18/2015   Pneumococcal Polysaccharide-23 06/23/2017, 06/23/2017   Tdap 02/22/2020    Diabetes He presents for his follow-up diabetic visit. He has type 2 diabetes mellitus. His disease  course has been improving. There are no hypoglycemic associated symptoms. Associated symptoms include fatigue and polyuria. There are no hypoglycemic complications. Symptoms are improving. Diabetic complications include a CVA, heart disease (CAD and CHF), nephropathy and peripheral neuropathy. Risk factors for coronary artery disease include diabetes mellitus, dyslipidemia, family history, obesity, male sex, hypertension and sedentary lifestyle. Current diabetic treatment includes oral agent (monotherapy) (and Ozempic). He is compliant with treatment most of the time. His weight is fluctuating minimally. He is following a generally healthy diet. When  asked about meal planning, he reported none. He has not had a previous visit with a dietitian. He rarely participates in exercise. His home blood glucose trend is decreasing steadily. His overall blood glucose range is 140-180 mg/dl. (He presents today with his CGM showing at goal glycemic profile overall.  His POCT A1c today is 6.8%, improving from last visit of 7.6%.  He denies any significant hypoglycemia.  He has lost approx 8 lbs since last visit and generally feels well.  Analysis of his CGM shows TIR 79%, TAR 21%, TBR 0%, with a GMI of 7.1%. ) An ACE inhibitor/angiotensin II receptor blocker is being taken. He does not see a podiatrist.Eye exam is current.     Review of systems  Constitutional: + decreasing body weight, current Body mass index is 30.98 kg/m., no fatigue, no subjective hyperthermia, no subjective hypothermia Eyes: no blurry vision, no xerophthalmia ENT: no sore throat, no nodules palpated in throat, no dysphagia/odynophagia, no hoarseness Cardiovascular: no chest pain, no shortness of breath, no palpitations, no leg swelling Respiratory: no cough, no shortness of breath Gastrointestinal: no nausea/vomiting/diarrhea Musculoskeletal: no muscle/joint aches Skin: no rashes, no hyperemia Neurological: no tremors, no numbness, no  tingling, no dizziness Psychiatric: no depression, no anxiety  Objective:     BP (!) 150/78 (BP Location: Left Arm, Patient Position: Sitting, Cuff Size: Large) Comment: Manuel Cuff  Pulse 80   Ht 5' 10.5" (1.791 m)   Wt 219 lb (99.3 kg)   BMI 30.98 kg/m   Wt Readings from Last 3 Encounters:  05/28/23 219 lb (99.3 kg)  02/10/23 227 lb (103 kg)  02/06/23 242 lb (109.8 kg)     BP Readings from Last 3 Encounters:  05/28/23 (!) 150/78  02/10/23 (!) 150/80  02/06/23 (!) 154/72     Physical Exam- Limited  Constitutional:  Body mass index is 30.98 kg/m. , not in acute distress, normal state of mind Eyes:  EOMI, no exophthalmos Neck: Supple Cardiovascular: RRR, no murmurs, rubs, or gallops, no edema Respiratory: Adequate breathing efforts, no crackles, rales, rhonchi, or wheezing Musculoskeletal: no gross deformities, strength intact in all four extremities, no gross restriction of joint movements Skin:  no rashes, no hyperemia Neurological: no tremor with outstretched hands    CMP ( most recent) CMP     Component Value Date/Time   NA 141 12/23/2021 0452   K 3.2 (L) 12/23/2021 0452   CL 102 12/23/2021 0452   CO2 32 12/23/2021 0452   GLUCOSE 119 (H) 12/23/2021 0452   BUN 22 12/23/2021 0452   CREATININE 0.93 12/23/2021 0452   CALCIUM 8.5 (L) 12/23/2021 0452   PROT 7.6 12/20/2021 1412   ALBUMIN 3.8 12/20/2021 1412   AST 20 12/20/2021 1412   ALT 23 12/20/2021 1412   ALKPHOS 85 12/20/2021 1412   BILITOT 0.6 12/20/2021 1412   GFRNONAA >60 12/23/2021 0452   GFRAA >60 06/21/2020 1510     Diabetic Labs (most recent): Lab Results  Component Value Date   HGBA1C 6.8 (A) 05/28/2023   HGBA1C 7.6 (A) 02/10/2023   HGBA1C 6.6 08/04/2022     Lipid Panel ( most recent) Lipid Panel     Component Value Date/Time   TRIG 163 (A) 08/04/2022 0000   LDLCALC 142 08/04/2022 0000      Lab Results  Component Value Date   TSH 3.96 08/04/2022           Assessment &  Plan:   1) Type 2 Diabetes mellitus without complication without  long-term current use of insulin  He presents today with his CGM showing at goal glycemic profile overall.  His POCT A1c today is 6.8%, improving from last visit of 7.6%.  He denies any significant hypoglycemia.  He has lost approx 8 lbs since last visit and generally feels well.  Analysis of his CGM shows TIR 79%, TAR 21%, TBR 0%, with a GMI of 7.1%. He is suffering through a kidney stone currently and noticed his sugars are a bit higher than what it has been running.  - Robert Brown has currently uncontrolled symptomatic type 2 DM since 72 years of age.   -Recent labs reviewed.  - I had a long discussion with him about the progressive nature of diabetes and the pathology behind its complications. -his diabetes is complicated by CAD with MI, CVA, CKD stage 2, neuropathy and he remains at a high risk for more acute and chronic complications which include CAD, CVA, CKD, retinopathy, and neuropathy. These are all discussed in detail with him.  The following Lifestyle Medicine recommendations according to American College of Lifestyle Medicine [facility]) were discussed and offered to patient and he agrees to start the journey:  A. Whole Foods, Plant-based plate comprising of fruits and vegetables, plant-based proteins, whole-grain carbohydrates was discussed in detail with the patient.   A list for source of those nutrients were also provided to the patient.  Patient will use only water or unsweetened tea for hydration. B.  The need to stay away from risky substances including alcohol, smoking; obtaining 7 to 9 hours of restorative sleep, at least 150 minutes of moderate intensity exercise weekly, the importance of healthy social connections,  and stress reduction techniques were discussed. C.  A full color page of  Calorie density of various food groups per pound showing examples of each food groups was provided to the patient.  - I have  counseled him on diet and weight management by adopting a carbohydrate restricted/protein rich diet. Patient is encouraged to switch to unprocessed or minimally processed complex starch and increased protein intake (animal or plant source), fruits, and vegetables. -  he is advised to stick to a routine mealtimes to eat 3 meals a day and avoid unnecessary snacks (to snack only to correct hypoglycemia).   - he acknowledges that there is a room for improvement in his food and drink choices. - Suggestion is made for him to avoid simple carbohydrates from his diet including Cakes, Sweet Desserts, Ice Cream, Soda (diet and regular), Sweet Tea, Candies, Chips, Cookies, Store Bought Juices, Alcohol in Excess of 1-2 drinks a day, Artificial Sweeteners, Coffee Creamer, and "Sugar-free" Products. This will help patient to have more stable blood glucose profile and potentially avoid unintended weight gain.  - I have approached him with the following individualized plan to manage his diabetes and patient agrees:   -Given his at goal glycemic profile, no changes will be made to his medications today.  He is advised to continue Ozempic 0.5 mg SQ weekly and Metformin 500 mg po twice daily with meals.   He could most certainly benefit from GLP1 product given his extensive CAD history with MI and CVA in the past.  -he is encouraged to start monitoring glucose once daily daily, before breakfast, and to call the clinic if he had readings less than 70 or above 300 for 3 tests in a row.  - he is warned not to take insulin without proper monitoring per orders. - Adjustment parameters are given to  him for hypo and hyperglycemia in writing.  - he is not a candidate for full dose Metformin due to concurrent renal insufficiency.  - Specific targets for  A1c; LDL, HDL, and Triglycerides were discussed with the patient.  2) Blood Pressure /Hypertension:  his blood pressure is NOT controlled to target.   he is advised to  continue his current medications including Losartan 100 mg po daily, Lasix 20 mg po daily,  Imdur 120 mg p.o. daily with breakfast, Coreg 25 mg po twice daily, and Diltiazem 240 mg po daily.  3) Lipids/Hyperlipidemia:    Review of his recent lipid panel from 01/28/23 showed uncontrolled LDL at 126 .  he is advised to continue Crestor 20 mg daily at bedtime.  Side effects and precautions discussed with him.  4)  Weight/Diet:  his Body mass index is 30.98 kg/m.  -  clearly complicating his diabetes care.   he is a candidate for weight loss. I discussed with him the fact that loss of 5 - 10% of his  current body weight will have the most impact on his diabetes management.  Exercise, and detailed carbohydrates information provided  -  detailed on discharge instructions.  5) Hypothyroidism-unspecified The details surrounding his diagnosis are not available.  He denies any known family history of thyroid problems.  He is currently on Levothyroxine 88 mcg po daily but notes he does take it with his other morning meds.  We discussed proper administration today.  - The correct intake of thyroid hormone (Levothyroxine, Synthroid), is on empty stomach first thing in the morning, with water, separated by at least 30 minutes from breakfast and other medications,  and separated by more than 4 hours from calcium, iron, multivitamins, acid reflux medications (PPIs).  - This medication is a life-long medication and will be needed to correct thyroid hormone imbalances for the rest of your life.  The dose may change from time to time, based on thyroid blood work.  - It is extremely important to be consistent taking this medication, near the same time each morning.  -AVOID TAKING PRODUCTS CONTAINING BIOTIN (commonly found in Hair, Skin, Nails vitamins) AS IT INTERFERES WITH THE VALIDITY OF THYROID FUNCTION BLOOD TESTS.  6) Chronic Care/Health Maintenance: -he is on ACEI/ARB and Statin medications and is encouraged to  initiate and continue to follow up with Ophthalmology, Dentist, Podiatrist at least yearly or according to recommendations, and advised to stay away from smoking. I have recommended yearly flu vaccine and pneumonia vaccine at least every 5 years; moderate intensity exercise for up to 150 minutes weekly; and sleep for at least 7 hours a day.  - he is advised to maintain close follow up with Waldon Reining, MD for primary care needs, as well as his other providers for optimal and coordinated care.     I spent  46  minutes in the care of the patient today including review of labs from CMP, Lipids, Thyroid Function, Hematology (current and previous including abstractions from other facilities); face-to-face time discussing  his blood glucose readings/logs, discussing hypoglycemia and hyperglycemia episodes and symptoms, medications doses, his options of short and long term treatment based on the latest standards of care / guidelines;  discussion about incorporating lifestyle medicine;  and documenting the encounter. Risk reduction counseling performed per USPSTF guidelines to reduce obesity and cardiovascular risk factors.     Please refer to Patient Instructions for Blood Glucose Monitoring and Insulin/Medications Dosing Guide"  in media tab for additional information.  Please  also refer to " Patient Self Inventory" in the Media  tab for reviewed elements of pertinent patient history.  Robert Brown participated in the discussions, expressed understanding, and voiced agreement with the above plans.  All questions were answered to his satisfaction. he is encouraged to contact clinic should he have any questions or concerns prior to his return visit.     Follow up plan: - Return in about 3 months (around 08/28/2023) for Diabetes F/U with A1c in office, Previsit labs, Thyroid follow up, Bring meter and logs.    Ronny Bacon, [facility] [address] [place], [place] [address] Phone: [phone] Fax: [phone]  05/29/2023, 7:11 AM

## 2023-05-29 LAB — POCT GLYCOSYLATED HEMOGLOBIN (HGB A1C): Hemoglobin A1C: 6.8 % — AB (ref 4.0–5.6)

## 2023-06-09 ENCOUNTER — Ambulatory Visit
Admission: RE | Admit: 2023-06-09 | Discharge: 2023-06-09 | Disposition: A | Discharge status: Home / Self Care | Payer: Medicare PPO | Source: Ambulatory Visit | Attending: Nurse Practitioner | Admitting: Nurse Practitioner

## 2023-06-09 VITALS — BP 185/86 | HR 77 | Temp 97.7°F | Resp 16

## 2023-06-09 DIAGNOSIS — M546 Pain in thoracic spine: Secondary | ICD-10-CM

## 2023-06-09 LAB — POCT URINALYSIS DIP (MANUAL ENTRY)
Bilirubin, UA: NEGATIVE
Glucose, UA: NEGATIVE mg/dL
Ketones, POC UA: NEGATIVE mg/dL
Nitrite, UA: NEGATIVE
Protein Ur, POC: NEGATIVE mg/dL
Spec Grav, UA: 1.025 (ref 1.010–1.025)
Urobilinogen, UA: 0.2 E.U./dL
pH, UA: 7 (ref 5.0–8.0)

## 2023-06-09 MED ORDER — LIDOCAINE 5 % EX PTCH: 1.0000 | MEDICATED_PATCH | CUTANEOUS | 0 refills | Status: DC

## 2023-06-09 MED ORDER — METHYLPREDNISOLONE ACETATE 40 MG/ML IJ SUSP
40.0000 mg | Freq: Once | INTRAMUSCULAR | Status: AC
  Administered 2023-06-09: 40 mg via INTRAMUSCULAR

## 2023-06-09 NOTE — ED Provider Notes (Signed)
[facility]    CSN: [id number] Arrival date & time: 06/09/23  1553      History   Chief Complaint Chief Complaint  Patient presents with   Back Pain    Entered by patient    HPI Robert Brown is a 72 y.o. male.     No new injury, has been lfiting wife for 2 montrhs for ankel fracture   Blood sugars at home 120-174    Past Medical History:  Diagnosis Date   CAD (coronary artery disease)    Cancer (HCC)    Diabetes mellitus without complication (HCC)    Hypertension    Stroke [facility])     Patient Active Problem List   Diagnosis Date Noted   (HFpEF) heart failure with preserved ejection fraction (HCC) 02/06/2023   Morbid obesity due to excess calories (HCC) 07/29/2022   CAP (community acquired pneumonia) 12/20/2021   Acute respiratory failure with hypoxia (HCC) 05/25/2021   COVID-19 virus infection 05/25/2021   HTN (hypertension) 05/25/2021   HLD (hyperlipidemia) 05/25/2021   DM (diabetes mellitus), type 2 (HCC) 05/25/2021   CAD (coronary artery disease) 05/25/2021   Hypothyroidism 05/25/2021   Prostate cancer (HCC) 05/25/2021   COPD GOLD 0 05/25/2021    Past Surgical History:  Procedure Laterality Date   bone grafts     TUMOR EXCISION         Home Medications    Prior to Admission medications   Medication Sig Start Date End Date Taking? Authorizing Provider  acyclovir (ZOVIRAX) 800 MG tablet SMARTSIG:1 Tablet(s) By Mouth 01/12/23   [provider]  albuterol (PROVENTIL) (2.5 MG/3ML) 0.083% nebulizer solution Take 3 mLs (2.5 mg total) by nebulization every 6 (six) hours as needed for wheezing or shortness of breath. 01/27/23   Nyoka Cowden, MD  albuterol (VENTOLIN HFA) 108 (90 Base) MCG/ACT inhaler Inhale 1-2 puffs into the lungs every 6 (six) hours as needed for shortness of breath or wheezing. 01/27/23   Nyoka Cowden, MD  aspirin EC 325 MG tablet Take 325 mg by mouth daily.    [provider]  Blood Glucose Monitoring  Suppl (ACCU-CHEK GUIDE) w/Device KIT Use to check glucose once daily. 02/10/23   Dani Gobble, NP  busPIRone (BUSPAR) 5 MG tablet Take by mouth. 07/03/21   [provider]  carvedilol (COREG) 25 MG tablet Take 25 mg by mouth 2 (two) times daily. 02/21/21   [provider]  diltiazem (CARDIZEM CD) 240 MG 24 hr capsule Take 240 mg by mouth daily. 12/31/22   [provider]  finasteride (PROSCAR) 5 MG tablet Take 5 mg by mouth daily. 02/09/20   [provider]  Fluticasone-Umeclidin-Vilant (TRELEGY ELLIPTA) 100-62.5-25 MCG/ACT AEPB Inhale 1 puff into the lungs daily. 01/27/23   Nyoka Cowden, MD  furosemide (LASIX) 20 MG tablet Take 20 mg by mouth daily.    [provider]  gabapentin (NEURONTIN) 300 MG capsule Take 300 mg by mouth 3 (three) times daily as needed. 12/02/22   [provider]  isosorbide mononitrate (IMDUR) 120 MG 24 hr tablet Take by mouth. 08/28/22   [provider]  levothyroxine (SYNTHROID) 88 MCG tablet Take 1 tablet (88 mcg total) by mouth every morning. 05/28/23   Dani Gobble, NP  losartan (COZAAR) 100 MG tablet Take 100 mg by mouth daily.    [provider]  metFORMIN (GLUCOPHAGE) 500 MG tablet Take 500 mg by mouth 2 (two) times daily. 02/04/23   [provider]  nitroGLYCERIN (NITROSTAT) 0.4 MG SL tablet Place under the tongue.    [provider]  ondansetron (ZOFRAN-ODT) 8 MG disintegrating tablet Take 8 mg by mouth every 8 (eight) hours as needed. 12/02/22   [provider]  pantoprazole (PROTONIX) 40 MG tablet Take 1 tablet (40 mg total) by mouth 2 (two) times daily. 12/23/21 12/23/22  Vassie Loll, MD  potassium chloride (KLOR-CON M) 10 MEQ tablet Take 10 mEq by mouth daily.    [provider]  rosuvastatin (CRESTOR) 20 MG tablet Take 20 mg by mouth at bedtime. 01/26/23   [provider]  Semaglutide,0.25 or 0.5MG /DOS, (OZEMPIC, 0.25 OR 0.5 MG/DOSE,) 2 MG/3ML  SOPN Inject 0.5 mg into the skin once a week. 03/10/23   Dani Gobble, NP  tamsulosin (FLOMAX) 0.4 MG CAPS capsule Take 0.4 mg by mouth 2 (two) times daily. 12/23/19   [provider]  valACYclovir (VALTREX) 1000 MG tablet Take 1,000 mg by mouth every 8 (eight) hours. 12/02/22   [provider]  Vitamin D, Ergocalciferol, (DRISDOL) 1.25 MG (50000 UNIT) CAPS capsule Take 50,000 Units by mouth once a week. 01/29/23   [provider]    Family History Family History  Problem Relation Age of Onset   Diabetes Mother    Diabetes Father     Social History Social History   Tobacco Use   Smoking status: Former    Current packs/day: 0.00    Types: Cigarettes    Quit date: 11/17/2006    Years since quitting: 16.5   Smokeless tobacco: Never  Vaping Use   Vaping status: Never Used  Substance Use Topics   Alcohol use: Not Currently    Comment: former drinker   Drug use: Never     Allergies   Lisinopril and Statins   Review of Systems Review of Systems   Physical Exam Triage Vital Signs ED Triage Vitals  Encounter Vitals Group     BP 06/09/23 1601 (!) 185/86     Systolic BP Percentile --      Diastolic BP Percentile --      Pulse Rate 06/09/23 1601 77     Resp 06/09/23 1601 16     Temp 06/09/23 1601 97.7 F (36.5 C)     Temp Source 06/09/23 1601 Oral     SpO2 06/09/23 1601 95 %     Weight --      Height --      Head Circumference --      Peak Flow --      Pain Score 06/09/23 1603 3     Pain Loc --      Pain Education --      Exclude from Growth Chart --    No data found.  Updated Vital Signs BP (!) 185/86 (BP Location: Left Arm)   Pulse 77   Temp 97.7 F (36.5 C) (Oral)   Resp 16   SpO2 95%   Visual Acuity Right Eye Distance:   Left Eye Distance:   Bilateral Distance:    Right Eye Near:   Left Eye Near:    Bilateral Near:     Physical Exam   UC Treatments / Results  Labs (all labs ordered are listed, but only abnormal  results are displayed) Labs Reviewed  POCT URINALYSIS DIP (MANUAL ENTRY) - Abnormal; Notable for the following components:      Result Value   Clarity, UA cloudy (*)    Blood, UA trace-intact (*)  Leukocytes, UA Small (1+) (*)    All other components within normal limits    EKG   Radiology No results found.  Procedures Procedures (including critical care time)  Medications Ordered in UC Medications - No data to display  Initial Impression / Assessment and Plan / UC Course  I have reviewed the triage vital signs and the nursing notes.  Pertinent labs & imaging results that were available during my care of the patient were reviewed by me and considered in my medical decision making (see chart for details).     *** Final Clinical Impressions(s) / UC Diagnoses   Final diagnoses:  None   Discharge Instructions   None    ED Prescriptions   None    PDMP not reviewed this encounter.

## 2023-06-09 NOTE — ED Triage Notes (Signed)
Pt states left flank pain for the past month.  States he saw his primary care doctor and had xrays. States he has an appointment with a urologist and orthopedist but not until next month.has been taking excedrin and tylenol at home for the pain with some relief.

## 2023-06-09 NOTE — Discharge Instructions (Addendum)
We have given you an injection of Depo Medrol today which is a steroid medication and should help with pain in your back.  Please refrain from taking any NSAIDs as this can increase bleeding risk.  Continue Tylenol and muscle relaxant.  Start Lidocaine patches as well.  Follow up with Ortho as planned

## 2023-06-12 ENCOUNTER — Ambulatory Visit (INDEPENDENT_AMBULATORY_CARE_PROVIDER_SITE_OTHER): Payer: Medicare PPO | Admitting: Orthopedic Surgery

## 2023-06-12 ENCOUNTER — Encounter: Payer: Self-pay | Admitting: Orthopedic Surgery

## 2023-06-12 VITALS — BP 193/93 | HR 71 | Ht 69.5 in | Wt 218.2 lb

## 2023-06-12 DIAGNOSIS — M545 Low back pain, unspecified: Secondary | ICD-10-CM

## 2023-06-12 MED ORDER — PREDNISONE 10 MG (21) PO TBPK: ORAL_TABLET | ORAL | 0 refills | Status: DC

## 2023-06-12 NOTE — Progress Notes (Signed)
New Patient Visit  Assessment: Robert Brown is a 72 y.o. male with the following: 1. Acute left-sided low back pain without sciatica  Plan: [name] has pain in the left side of his lower back.  Occasional symptoms into the left leg.  Pain has been ongoing for the past 5 weeks or so.  Over-the-counter medications have not been effective.  He has noticed some improvement following an IM injection of steroid.  Reviewed radiographs with patient in clinic today.  No anterolisthesis.  He does have some degenerative changes.  Description of his pain most consistent with a muscle spasm.  I recommended a short course of prednisone.  He states he already has a muscle relaxer, and I recommend he continue to take this as needed.  He will return in 3 weeks for repeat evaluation.  Follow-up: Return in about 3 weeks (around 07/03/2023).  Subjective:  Chief Complaint  Patient presents with   Back Pain    Patient has complaint of low back pain radiating around the left side x 5 weeks. He is unsure of any injury. He feels "grabbing" pain with certain positions. At times, he may have pain down the left leg when he steps down to walk. He is unsure of any numbness or tingling as he is diabetic. He is taking tylenol and an occasional muscle relaxer. He went to Urgent Care and had an IM injection 06/09/2023 with reilef. He does help wife getting up and down.    History of Present Illness: Robert Brown is a 72 y.o. male who has been referred by [name], MD for evaluation of low back pain.  He states he had pain in the left side of his lower back for the past 5 weeks.  He describes the pain is excruciating.  He has a catching sensation.  Occasionally, the pain will radiate to his left leg.  He notes the symptoms a little bit more frequently when he is ambulating.  For months, he states he has been helping to take care of his wife, who has been treated for a fracture.  No specific injury or onset of pain.   Recently, the pain has been excruciating.  He was seen at an urgent care center.  He had an IM injection of steroid, which has improved some of his symptoms.  Otherwise, he has been taking Tylenol.  He states he has a history of herniated disks, but this was many years ago.   Review of Systems: No fevers or chills No chest pain No shortness of breath No bowel or bladder dysfunction No GI distress No headaches   Medical History:  Past Medical History:  Diagnosis Date   CAD (coronary artery disease)    Cancer (HCC)    Diabetes mellitus without complication (HCC)    Hypertension    Stroke [name])     Past Surgical History:  Procedure Laterality Date   bone grafts     TUMOR EXCISION      Family History  Problem Relation Age of Onset   Diabetes Mother    Diabetes Father    Social History   Tobacco Use   Smoking status: Former    Current packs/day: 0.00    Types: Cigarettes    Quit date: 11/17/2006    Years since quitting: 16.5   Smokeless tobacco: Never  Vaping Use   Vaping status: Never Used  Substance Use Topics   Alcohol use: Not Currently    Comment: former drinker   Drug  use: Never    Allergies  Allergen Reactions   Lisinopril     Medicines ending in -pril   Statins     Current Meds  Medication Sig   acyclovir (ZOVIRAX) 800 MG tablet SMARTSIG:1 Tablet(s) By Mouth   albuterol (PROVENTIL) (2.5 MG/3ML) 0.083% nebulizer solution Take 3 mLs (2.5 mg total) by nebulization every 6 (six) hours as needed for wheezing or shortness of breath.   albuterol (VENTOLIN HFA) 108 (90 Base) MCG/ACT inhaler Inhale 1-2 puffs into the lungs every 6 (six) hours as needed for shortness of breath or wheezing.   aspirin EC 325 MG tablet Take 325 mg by mouth daily.   Blood Glucose Monitoring Suppl (ACCU-CHEK GUIDE) w/Device KIT Use to check glucose once daily.   busPIRone (BUSPAR) 5 MG tablet Take by mouth.   carvedilol (COREG) 25 MG tablet Take 25 mg by mouth 2 (two) times daily.    diltiazem (CARDIZEM CD) 240 MG 24 hr capsule Take 240 mg by mouth daily.   finasteride (PROSCAR) 5 MG tablet Take 5 mg by mouth daily.   Fluticasone-Umeclidin-Vilant (TRELEGY ELLIPTA) 100-62.5-25 MCG/ACT AEPB Inhale 1 puff into the lungs daily.   furosemide (LASIX) 20 MG tablet Take 20 mg by mouth daily.   gabapentin (NEURONTIN) 300 MG capsule Take 300 mg by mouth 3 (three) times daily as needed.   isosorbide mononitrate (IMDUR) 120 MG 24 hr tablet Take by mouth.   levothyroxine (SYNTHROID) 88 MCG tablet Take 1 tablet (88 mcg total) by mouth every morning.   lidocaine (LIDODERM) 5 % Place 1 patch onto the skin daily. Remove & Discard patch within 12 hours or as directed by MD   losartan (COZAAR) 100 MG tablet Take 100 mg by mouth daily.   metFORMIN (GLUCOPHAGE) 500 MG tablet Take 500 mg by mouth 2 (two) times daily.   nitroGLYCERIN (NITROSTAT) 0.4 MG SL tablet Place under the tongue.   ondansetron (ZOFRAN-ODT) 8 MG disintegrating tablet Take 8 mg by mouth every 8 (eight) hours as needed.   potassium chloride (KLOR-CON M) 10 MEQ tablet Take 10 mEq by mouth daily.   predniSONE (STERAPRED UNI-PAK 21 TAB) 10 MG (21) TBPK tablet 10 mg DS 12 as directed   rosuvastatin (CRESTOR) 20 MG tablet Take 20 mg by mouth at bedtime.   Semaglutide,0.25 or 0.5MG /DOS, (OZEMPIC, 0.25 OR 0.5 MG/DOSE,) 2 MG/3ML SOPN Inject 0.5 mg into the skin once a week.   tamsulosin (FLOMAX) 0.4 MG CAPS capsule Take 0.4 mg by mouth 2 (two) times daily.   valACYclovir (VALTREX) 1000 MG tablet Take 1,000 mg by mouth every 8 (eight) hours.   Vitamin D, Ergocalciferol, (DRISDOL) 1.25 MG (50000 UNIT) CAPS capsule Take 50,000 Units by mouth once a week.    Objective: BP (!) 193/93 Comment: right arm  Pulse 71   Ht 5' 9.5" (1.765 m)   Wt 218 lb 3.2 oz (99 kg)   BMI 31.76 kg/m   Physical Exam:  General: Alert and oriented. and No acute distress. Gait: Slow, steady gait.  Evaluation of lower back demonstrates no deformity.   He has tenderness palpation.  Negative straight leg raise bilaterally.  He has good lower body strength.  He states he has decreased sensation in both legs, which is his baseline.  Toes warm and well-perfused.  2+ patellar tendon reflex.  IMAGING: I personally reviewed images previously obtained in clinic   X-rays of the lumbar spine were previously obtained.  No anterolisthesis.  No acute injury.  Diffuse degenerative  changes, with anterior based osteophytes at several levels.   New Medications:  Meds ordered this encounter  Medications   predniSONE (STERAPRED UNI-PAK 21 TAB) 10 MG (21) TBPK tablet    Sig: 10 mg DS 12 as directed    Dispense:  48 tablet    Refill:  0      Oliver Barre, MD  06/12/2023 11:30 AM

## 2023-06-22 DIAGNOSIS — E1165 Type 2 diabetes mellitus with hyperglycemia: Secondary | ICD-10-CM | POA: Diagnosis not present

## 2023-06-22 DIAGNOSIS — E08649 Diabetes mellitus due to underlying condition with hypoglycemia without coma: Secondary | ICD-10-CM | POA: Diagnosis not present

## 2023-06-23 DIAGNOSIS — C61 Malignant neoplasm of prostate: Secondary | ICD-10-CM | POA: Diagnosis not present

## 2023-06-23 DIAGNOSIS — R399 Unspecified symptoms and signs involving the genitourinary system: Secondary | ICD-10-CM | POA: Diagnosis not present

## 2023-06-23 DIAGNOSIS — N200 Calculus of kidney: Secondary | ICD-10-CM | POA: Diagnosis not present

## 2023-06-23 DIAGNOSIS — N401 Enlarged prostate with lower urinary tract symptoms: Secondary | ICD-10-CM | POA: Diagnosis not present

## 2023-06-23 DIAGNOSIS — R3912 Poor urinary stream: Secondary | ICD-10-CM | POA: Diagnosis not present

## 2023-06-28 ENCOUNTER — Emergency Department (HOSPITAL_COMMUNITY)
Admission: EM | Admit: 2023-06-28 | Discharge: 2023-06-28 | Disposition: A | Discharge status: Home / Self Care | Payer: Medicare PPO | Attending: Emergency Medicine | Admitting: Emergency Medicine

## 2023-06-28 ENCOUNTER — Encounter (HOSPITAL_COMMUNITY): Payer: Self-pay

## 2023-06-28 ENCOUNTER — Other Ambulatory Visit: Payer: Self-pay

## 2023-06-28 ENCOUNTER — Emergency Department (HOSPITAL_COMMUNITY): Payer: Medicare PPO

## 2023-06-28 DIAGNOSIS — K1121 Acute sialoadenitis: Secondary | ICD-10-CM | POA: Diagnosis not present

## 2023-06-28 DIAGNOSIS — R609 Edema, unspecified: Secondary | ICD-10-CM | POA: Diagnosis not present

## 2023-06-28 DIAGNOSIS — Z79899 Other long term (current) drug therapy: Secondary | ICD-10-CM | POA: Diagnosis not present

## 2023-06-28 DIAGNOSIS — I10 Essential (primary) hypertension: Secondary | ICD-10-CM | POA: Diagnosis not present

## 2023-06-28 DIAGNOSIS — Z7982 Long term (current) use of aspirin: Secondary | ICD-10-CM | POA: Diagnosis not present

## 2023-06-28 DIAGNOSIS — Z7984 Long term (current) use of oral hypoglycemic drugs: Secondary | ICD-10-CM | POA: Diagnosis not present

## 2023-06-28 DIAGNOSIS — D72829 Elevated white blood cell count, unspecified: Secondary | ICD-10-CM | POA: Diagnosis not present

## 2023-06-28 DIAGNOSIS — K1120 Sialoadenitis, unspecified: Secondary | ICD-10-CM | POA: Insufficient documentation

## 2023-06-28 DIAGNOSIS — E119 Type 2 diabetes mellitus without complications: Secondary | ICD-10-CM | POA: Diagnosis not present

## 2023-06-28 DIAGNOSIS — Z794 Long term (current) use of insulin: Secondary | ICD-10-CM | POA: Diagnosis not present

## 2023-06-28 DIAGNOSIS — R220 Localized swelling, mass and lump, head: Secondary | ICD-10-CM | POA: Diagnosis present

## 2023-06-28 DIAGNOSIS — I700 Atherosclerosis of aorta: Secondary | ICD-10-CM | POA: Diagnosis not present

## 2023-06-28 DIAGNOSIS — K111 Hypertrophy of salivary gland: Secondary | ICD-10-CM | POA: Diagnosis not present

## 2023-06-28 LAB — CBC WITH DIFFERENTIAL/PLATELET
Abs Immature Granulocytes: 0.07 10*3/uL (ref 0.00–0.07)
Basophils Absolute: 0 10*3/uL (ref 0.0–0.1)
Basophils Relative: 0 %
Eosinophils Absolute: 0.3 10*3/uL (ref 0.0–0.5)
Eosinophils Relative: 2 %
HCT: 42.7 % (ref 39.0–52.0)
Hemoglobin: 13.6 g/dL (ref 13.0–17.0)
Immature Granulocytes: 1 %
Lymphocytes Relative: 21 %
Lymphs Abs: 2.4 10*3/uL (ref 0.7–4.0)
MCH: 27.5 pg (ref 26.0–34.0)
MCHC: 31.9 g/dL (ref 30.0–36.0)
MCV: 86.3 fL (ref 80.0–100.0)
Monocytes Absolute: 0.9 10*3/uL (ref 0.1–1.0)
Monocytes Relative: 8 %
Neutro Abs: 7.6 10*3/uL (ref 1.7–7.7)
Neutrophils Relative %: 68 %
Platelets: 158 10*3/uL (ref 150–400)
RBC: 4.95 MIL/uL (ref 4.22–5.81)
RDW: 14.3 % (ref 11.5–15.5)
WBC: 11.4 10*3/uL — ABNORMAL HIGH (ref 4.0–10.5)
nRBC: 0 % (ref 0.0–0.2)

## 2023-06-28 LAB — BASIC METABOLIC PANEL
Anion gap: 9 (ref 5–15)
BUN: 21 mg/dL (ref 8–23)
CO2: 28 mmol/L (ref 22–32)
Calcium: 9.1 mg/dL (ref 8.9–10.3)
Chloride: 100 mmol/L (ref 98–111)
Creatinine, Ser: 1.08 mg/dL (ref 0.61–1.24)
GFR, Estimated: >60 mL/min (ref >60)
Glucose, Bld: 122 mg/dL — ABNORMAL HIGH (ref 70–99)
Potassium: 3.4 mmol/L — ABNORMAL LOW (ref 3.5–5.1)
Sodium: 137 mmol/L (ref 135–145)

## 2023-06-28 MED ORDER — AMOXICILLIN-POT CLAVULANATE 875-125 MG PO TABS: 1.0000 | ORAL_TABLET | Freq: Two times a day (BID) | ORAL | 0 refills | Status: DC

## 2023-06-28 MED ORDER — AMPICILLIN-SULBACTAM SODIUM 3 (2-1) G IV SOLR
3.0000 g | Freq: Once | INTRAVENOUS | Status: DC
Start: 2023-06-28 — End: 2023-06-28

## 2023-06-28 MED ORDER — IOHEXOL 300 MG/ML  SOLN
75.0000 mL | Freq: Once | INTRAMUSCULAR | Status: AC | PRN
  Administered 2023-06-28: 75 mL via INTRAVENOUS

## 2023-06-28 MED ORDER — SODIUM CHLORIDE 0.9 % IV SOLN
3.0000 g | Freq: Once | INTRAVENOUS | Status: AC
  Administered 2023-06-28: 3 g via INTRAVENOUS
  Filled 2023-06-28: qty 8

## 2023-06-28 NOTE — Discharge Instructions (Signed)
Your testing today confirmed that you have an infection in your salivary gland.  This is called parotitis, please read the attached instructions regarding this.  You are to take the antibiotic twice a day for the neck 7 days, warm compresses, ibuprofen 3 times a day, ER for severe worsening swelling fever or any worsening symptoms otherwise see your doctor in 3 days for recheck

## 2023-06-28 NOTE — ED Provider Notes (Signed)
[facility] EMERGENCY DEPARTMENT AT [facility] Provider Note   CSN: [id number] Arrival date & time: 06/28/23  1728     History  Chief Complaint  Patient presents with   Abscess    Robert Brown is a 72 y.o. male.   Abscess Associated symptoms: no fever    Pt with 4 days of swelling under the R mandible. He is a diabetic recently on steroids b/c of a back pain which he stopped last week b/c it wasn't helping his back.  No f/c/n/v and no dental pain or difficulty swallowing / breathing or talking.  No other recent illnesses.    Home Medications Prior to Admission medications   Medication Sig Start Date End Date Taking? Authorizing Provider  amoxicillin-clavulanate (AUGMENTIN) 875-125 MG tablet Take 1 tablet by mouth every 12 (twelve) hours. 06/28/23  Yes Eber Hong, MD  acyclovir (ZOVIRAX) 800 MG tablet SMARTSIG:1 Tablet(s) By Mouth 01/12/23   [provider]  albuterol (PROVENTIL) (2.5 MG/3ML) 0.083% nebulizer solution Take 3 mLs (2.5 mg total) by nebulization every 6 (six) hours as needed for wheezing or shortness of breath. 01/27/23   Nyoka Cowden, MD  albuterol (VENTOLIN HFA) 108 (90 Base) MCG/ACT inhaler Inhale 1-2 puffs into the lungs every 6 (six) hours as needed for shortness of breath or wheezing. 01/27/23   Nyoka Cowden, MD  aspirin EC 325 MG tablet Take 325 mg by mouth daily.    [provider]  Blood Glucose Monitoring Suppl (ACCU-CHEK GUIDE) w/Device KIT Use to check glucose once daily. 02/10/23   Dani Gobble, NP  busPIRone (BUSPAR) 5 MG tablet Take by mouth. 07/03/21   [provider]  carvedilol (COREG) 25 MG tablet Take 25 mg by mouth 2 (two) times daily. 02/21/21   [provider]  diltiazem (CARDIZEM CD) 240 MG 24 hr capsule Take 240 mg by mouth daily. 12/31/22   [provider]  finasteride (PROSCAR) 5 MG tablet Take 5 mg by mouth daily. 02/09/20   [provider]  Fluticasone-Umeclidin-Vilant  (TRELEGY ELLIPTA) 100-62.5-25 MCG/ACT AEPB Inhale 1 puff into the lungs daily. 01/27/23   Nyoka Cowden, MD  furosemide (LASIX) 20 MG tablet Take 20 mg by mouth daily.    [provider]  gabapentin (NEURONTIN) 300 MG capsule Take 300 mg by mouth 3 (three) times daily as needed. 12/02/22   [provider]  isosorbide mononitrate (IMDUR) 120 MG 24 hr tablet Take by mouth. 08/28/22   [provider]  levothyroxine (SYNTHROID) 88 MCG tablet Take 1 tablet (88 mcg total) by mouth every morning. 05/28/23   Dani Gobble, NP  lidocaine (LIDODERM) 5 % Place 1 patch onto the skin daily. Remove & Discard patch within 12 hours or as directed by MD 06/09/23   Valentino Nose, NP  losartan (COZAAR) 100 MG tablet Take 100 mg by mouth daily.    [provider]  metFORMIN (GLUCOPHAGE) 500 MG tablet Take 500 mg by mouth 2 (two) times daily. 02/04/23   [provider]  nitroGLYCERIN (NITROSTAT) 0.4 MG SL tablet Place under the tongue.    [provider]  ondansetron (ZOFRAN-ODT) 8 MG disintegrating tablet Take 8 mg by mouth every 8 (eight) hours as needed. 12/02/22   [provider]  pantoprazole (PROTONIX) 40 MG tablet Take 1 tablet (40 mg total) by mouth 2 (two) times daily. 12/23/21 12/23/22  Vassie Loll, MD  potassium chloride (KLOR-CON M) 10 MEQ tablet Take 10 mEq by  mouth daily.    [provider]  predniSONE (STERAPRED UNI-PAK 21 TAB) 10 MG (21) TBPK tablet 10 mg DS 12 as directed 06/12/23   Oliver Barre, MD  rosuvastatin (CRESTOR) 20 MG tablet Take 20 mg by mouth at bedtime. 01/26/23   [provider]  Semaglutide,0.25 or 0.5MG /DOS, (OZEMPIC, 0.25 OR 0.5 MG/DOSE,) 2 MG/3ML SOPN Inject 0.5 mg into the skin once a week. 03/10/23   Dani Gobble, NP  tamsulosin (FLOMAX) 0.4 MG CAPS capsule Take 0.4 mg by mouth 2 (two) times daily. 12/23/19   [provider]  valACYclovir (VALTREX) 1000 MG tablet Take 1,000 mg by mouth  every 8 (eight) hours. 12/02/22   [provider]  Vitamin D, Ergocalciferol, (DRISDOL) 1.25 MG (50000 UNIT) CAPS capsule Take 50,000 Units by mouth once a week. 01/29/23   [provider]      Allergies    Lisinopril and Statins    Review of Systems   Review of Systems  Constitutional:  Negative for fever.  HENT:  Positive for facial swelling. Negative for dental problem, ear pain, rhinorrhea, sore throat, trouble swallowing and voice change.   Respiratory:  Negative for cough and shortness of breath.   Cardiovascular:  Negative for chest pain and leg swelling.    Physical Exam Updated Vital Signs BP (!) 180/74 (BP Location: Left Arm)   Pulse 70   Temp 97.7 F (36.5 C) (Oral)   Resp 17   Ht 1.765 m (5' 9.5")   Wt 98.9 kg   SpO2 96%   BMI 31.73 kg/m  Physical Exam Vitals and nursing note reviewed.  Constitutional:      Appearance: He is well-developed. He is not diaphoretic.  HENT:     Head: Normocephalic and atraumatic.     Nose: Nose normal.     Mouth/Throat:     Mouth: Mucous membranes are moist.     Comments: No dental abnormalities - no trismus / torticollis, no ttp over the teeth.  R mandible has no ttp over the bone but has a mobile 3X2 cm mass under the R mandible - submandibular area towards the angle of the mandible - no other LAD present. Eyes:     General:        Right eye: No discharge.        Left eye: No discharge.     Conjunctiva/sclera: Conjunctivae normal.  Cardiovascular:     Rate and Rhythm: Normal rate and regular rhythm.  Pulmonary:     Effort: Pulmonary effort is normal. No respiratory distress.  Musculoskeletal:     Cervical back: Normal range of motion and neck supple. No rigidity.     Right lower leg: No edema.     Left lower leg: No edema.  Skin:    General: Skin is warm and dry.     Findings: No erythema or rash.  Neurological:     Mental Status: He is alert.     Coordination: Coordination normal.     ED Results /  Procedures / Treatments   Labs (all labs ordered are listed, but only abnormal results are displayed) Labs Reviewed  CBC WITH DIFFERENTIAL/PLATELET - Abnormal; Notable for the following components:      Result Value   WBC 11.4 (*)    All other components within normal limits  BASIC METABOLIC PANEL - Abnormal; Notable for the following components:   Potassium 3.4 (*)    Glucose, Bld 122 (*)    All  other components within normal limits    EKG None  Radiology CT Soft Tissue Neck W Contrast  Result Date: 06/28/2023 CLINICAL DATA:  Submandibular mass EXAM: CT NECK WITH CONTRAST TECHNIQUE: Multidetector CT imaging of the neck was performed using the standard protocol following the bolus administration of intravenous contrast. RADIATION DOSE REDUCTION: This exam was performed according to the departmental dose-optimization program which includes automated exposure control, adjustment of the mA and/or kV according to patient size and/or use of iterative reconstruction technique. CONTRAST:  75mL OMNIPAQUE IOHEXOL 300 MG/ML  SOLN COMPARISON:  None Available. FINDINGS: Pharynx and larynx: Normal. No mass or swelling. Salivary glands: The right parotid gland is enlarged. There is inflammatory change at the inferior tip of the parotid with thickening of the adjacent platysma. There is edema of the inferior insertion of the masseter muscle. No sialolithiasis. Thyroid: Normal. Lymph nodes: None enlarged or abnormal density. Vascular: Atherosclerosis of the aortic arch and carotid bifurcations. Limited intracranial: Normal Visualized orbits: Normal Mastoids and visualized paranasal sinuses: Clear Skeleton: Negative Upper chest: Clear Other: None IMPRESSION: 1. Enlarged right parotid gland with inflammatory change at the inferior tip of the parotid with edema of the adjacent platysma and inferior insertion of the masseter, likely acute parotiditis. No sialolithiasis. Aortic Atherosclerosis (ICD10-I70.0).  Electronically Signed   By: Deatra Robinson M.D.   On: 06/28/2023 19:21    Procedures Procedures    Medications Ordered in ED Medications  iohexol (OMNIPAQUE) 300 MG/ML solution 75 mL (75 mLs Intravenous Contrast Given 06/28/23 1827)  Ampicillin-Sulbactam (UNASYN) 3 g in sodium chloride 0.9 % 100 mL IVPB (0 g Intravenous Stopped 06/28/23 2010)    ED Course/ Medical Decision Making/ A&P                                 Medical Decision Making Amount and/or Complexity of Data Reviewed Labs: ordered. Radiology: ordered.  Risk Prescription drug management.    This patient presents to the ED for concern of infection versus abscess versus lymphadenopathy differential diagnosis includes as above    Additional history obtained:  Additional history obtained from medical record External records from outside source obtained and reviewed including office visits for sleep apnea and diabetes, no recent admissions   Lab Tests:  I Ordered, and personally interpreted labs.  The pertinent results include: CBC with mild leukocytosis   Imaging Studies ordered:  I ordered imaging studies including CT scan of the neck I independently visualized and interpreted imaging which showed parotitis, no abscess I agree with the radiologist interpretation   Medicines ordered and prescription drug management:  I ordered medication including Unasyn for infection Reevaluation of the patient after these medicines showed that the patient improved I have reviewed the patients home medicines and have made adjustments as needed   Problem List / ED Course:  Patient stable, vital signs unremarkable except for mild hypertension, no need for admission to the hospital, patient informed of all of his results, agreeable and stable for discharge   Social Determinants of Health:  Diabetic           Final Clinical Impression(s) / ED Diagnoses Final diagnoses:  Parotitis    Rx / DC Orders ED  Discharge Orders          Ordered    amoxicillin-clavulanate (AUGMENTIN) 875-125 MG tablet  Every 12 hours        06/28/23 2026  Eber Hong, MD 06/28/23 2028

## 2023-06-28 NOTE — ED Triage Notes (Signed)
"  Abscess on right side of my jaw that keeps getting bigger instead of better" per pt  Denies difficulty swallowing or feeling like it is impeding at throat area. Denies any dental pain

## 2023-07-01 DIAGNOSIS — K5730 Diverticulosis of large intestine without perforation or abscess without bleeding: Secondary | ICD-10-CM | POA: Diagnosis not present

## 2023-07-01 DIAGNOSIS — N400 Enlarged prostate without lower urinary tract symptoms: Secondary | ICD-10-CM | POA: Diagnosis not present

## 2023-07-01 DIAGNOSIS — K8020 Calculus of gallbladder without cholecystitis without obstruction: Secondary | ICD-10-CM | POA: Diagnosis not present

## 2023-07-01 DIAGNOSIS — E278 Other specified disorders of adrenal gland: Secondary | ICD-10-CM | POA: Diagnosis not present

## 2023-07-01 DIAGNOSIS — N200 Calculus of kidney: Secondary | ICD-10-CM | POA: Diagnosis not present

## 2023-07-03 ENCOUNTER — Ambulatory Visit (INDEPENDENT_AMBULATORY_CARE_PROVIDER_SITE_OTHER): Payer: Medicare PPO | Admitting: Orthopedic Surgery

## 2023-07-03 ENCOUNTER — Encounter: Payer: Self-pay | Admitting: Orthopedic Surgery

## 2023-07-03 VITALS — BP 200/101 | HR 80 | Ht 69.5 in | Wt 218.4 lb

## 2023-07-03 DIAGNOSIS — M545 Low back pain, unspecified: Secondary | ICD-10-CM | POA: Diagnosis not present

## 2023-07-03 NOTE — Progress Notes (Signed)
Return patient Visit  Assessment: Robert Brown is a 72 y.o. male with the following: 1. Acute left-sided low back pain without sciatica  Plan: Robert Brown continues to have sharp, grabbing pain in the left lower back.  This is in the area of the thoracolumbar junction.  Prednisone was not effective.  His urologist sent him for CT scan, which does demonstrate some pathology.  Unclear if this could be contributing to his current pain.  Nonetheless, I recommended he follow-up with his urologist, for review of the CT scan findings.  If these findings are not related to his ongoing pain, then I would probably refer him to see a spine specialist.  He states understanding.  He will let me know if he needs a referral to see a spine specialist.  Follow-up: Return if symptoms worsen or fail to improve.  Subjective:  Chief Complaint  Patient presents with   Back Pain    L side LBP    History of Present Illness: Robert Brown is a 72 y.o. male who returns to clinic for repeat evaluation of left-sided lower back pain.  I saw him in clinic approximately 3 weeks ago.  At that time, he was complaining of pain in his left lower back, flank area.  He does have a history of surgery in his left leg, including bone graft surgery in the left hip.  No specific injury.  At the last clinic visit, I gave him a course of prednisone.  He stopped taking this, as it was not effective.  He states the Tylenol and ibuprofen has been helping with his pain.  He has recently had a CT scan of his abdomen, as recommended by his urologist.  No radiating pains into the left leg.  No numbness or tingling.  The pain does affect his mobility.  Review of Systems: No fevers or chills No chest pain No shortness of breath No bowel or bladder dysfunction No GI distress No headaches    Objective: BP (!) 200/101   Pulse 80   Ht 5' 9.5" (1.765 m)   Wt 218 lb 6.4 oz (99.1 kg)   BMI 31.79 kg/m   Physical Exam:  General: Alert  and oriented. and No acute distress. Gait: Slow, steady gait.  Evaluation of lower back demonstrates no deformity.  He has no tenderness to palpation.  Negative straight leg raise bilaterally.  He has good lower body strength.  He states he has decreased sensation in both legs, which is his baseline.  Toes warm and well-perfused.  2+ patellar tendon reflex.  IMAGING: No new imaging obtained today   New Medications:  No orders of the defined types were placed in this encounter.     Oliver Barre, MD  07/03/2023 12:17 PM

## 2023-07-03 NOTE — Patient Instructions (Signed)
Follow-up with your urologist regarding the recent CT scan findings.  If your complaints are not related to the findings on the CT scan, please contact the clinic and I will be happy to refer you to a back specialist.

## 2023-07-05 DIAGNOSIS — E1165 Type 2 diabetes mellitus with hyperglycemia: Secondary | ICD-10-CM | POA: Diagnosis not present

## 2023-07-05 DIAGNOSIS — I10 Essential (primary) hypertension: Secondary | ICD-10-CM | POA: Diagnosis not present

## 2023-07-23 ENCOUNTER — Telehealth: Payer: Self-pay | Admitting: *Deleted

## 2023-07-23 NOTE — Telephone Encounter (Signed)
[facility] form received by [facility] and advised that this is deferred to PCP since other chronic conditions are also listed that cardiology doesn't manage. Left message for patient to call office.

## 2023-07-23 NOTE — Telephone Encounter (Signed)
Patient informed and verbalized understanding of plan. Will have courier transport form back to the [place] office.

## 2023-07-28 DIAGNOSIS — E785 Hyperlipidemia, unspecified: Secondary | ICD-10-CM | POA: Diagnosis not present

## 2023-07-28 DIAGNOSIS — Z1331 Encounter for screening for depression: Secondary | ICD-10-CM | POA: Diagnosis not present

## 2023-07-28 DIAGNOSIS — E039 Hypothyroidism, unspecified: Secondary | ICD-10-CM | POA: Diagnosis not present

## 2023-07-28 DIAGNOSIS — E1165 Type 2 diabetes mellitus with hyperglycemia: Secondary | ICD-10-CM | POA: Diagnosis not present

## 2023-07-28 DIAGNOSIS — Z1389 Encounter for screening for other disorder: Secondary | ICD-10-CM | POA: Diagnosis not present

## 2023-07-28 DIAGNOSIS — I509 Heart failure, unspecified: Secondary | ICD-10-CM | POA: Diagnosis not present

## 2023-07-28 DIAGNOSIS — I10 Essential (primary) hypertension: Secondary | ICD-10-CM | POA: Diagnosis not present

## 2023-07-28 DIAGNOSIS — M5136 Other intervertebral disc degeneration, lumbar region: Secondary | ICD-10-CM | POA: Diagnosis not present

## 2023-07-28 DIAGNOSIS — G4733 Obstructive sleep apnea (adult) (pediatric): Secondary | ICD-10-CM | POA: Diagnosis not present

## 2023-07-29 NOTE — Progress Notes (Unsigned)
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   Discharge Diagnoses: Principal Problem:   Acute respiratory failure with hypoxia (HCC)   HTN (hypertension)   HLD (hyperlipidemia)   DM (diabetes mellitus), type 2 (HCC)   CAD (coronary artery disease)   Hypothyroidism   Prostate cancer (HCC)   COPD (chronic obstructive pulmonary disease) (HCC)   CAP (community acquired pneumonia)     Brief admission narrative:     72 y.o. male, with a history of coronary artery disease, hypertension, diabetes, COPD, no oxygen requirement at baseline, hyperlipidemia, CAD, prostate cancer, hypothyroidism,  November, report he never felt back to baseline after his COVID infection, patient presents to ED secondary to complaints of shortness of breath, cough and chest tightness, as well reports cough is productive with orange-colored sputum, he does reports chest pain worsening with dyspnea, denies any fever, chills, reports feeling fatigue.  Patient went to urgent care, as was found to be hypoxic, was started on 4 L oxygen and was sent to ED for further evaluation -in ED given his chest pain CTA was obtained, no PE, but significant for pneumonia, EKG nonacute, first troponin is negative, he is on 4 L nasal cannula upon my evaluation, dips to 86% once put on room air, nevic and wheezing which has improved with steroids and nebulizer treatment,  given his hypoxia, pneumonia, [facility] consulted to admit.   Assessment and Plan: * Acute respiratory failure with hypoxia (HCC)- (present on admission) -In the setting of community-acquired pneumonia and COPD exacerbation. -Continue treatment with steroids tapering, mucolytic's and bronchodilator management. -At discharge patient not requiring oxygen supplementation. -Continue the use of flutter valve and incentive spirometer has been requested. -Recommending outpatient follow-up with pulmonologist for PFTs and further adjustment on maintenance therapy for COPD.     CAP (community acquired pneumonia) -As mentioned above patient presenting community-acquired pneumonia -Continue antibiotic management -Patient is afebrile and not requiring oxygen supplementation. -Elevation in his WBCs in the presence of his steroid usage. -Repeat chest x-ray in 6-8 weeks to assure complete resolution of infiltrates.   COPD (chronic obstructive pulmonary disease) (HCC)- (present on admission) -With acute exacerbation as mentioned above. -Continue treatment with steroids (tapering dose), mucolytic's and bronchodilator management. -Currently off oxygen and not requiring supplementation. -Encourage to use flutter valve and incentive spirometer as instructed. -Patient instructed to arrange outpatient follow-up with pulmonologist for PFTs and further adjustment to maintenance therapy. -Discharged on as needed albuterol, Breo Ellipta for maintenance and PPI therapy.   Prostate cancer (HCC)- (present on admission) -History of prostate cancer and BPH -Continue treatment with Flomax and Proscar. -Reports no complaints of urinary retention currently. -Continue patient follow-up with urology service.   Hypothyroidism- (present on admission) -Continue Synthroid. -Continue to follow thyroid panel intermittently as an outpatient.   CAD (coronary artery disease)- (present on admission) -No complaining of  chest pain -Continue treatment with  Coreg, losartan, aspirin, Imdur and statin. -Continue risk factor modifications and outpatient follow-up with cardiology service.     DM (diabetes mellitus), type 2 (HCC) -Patient receiving sliding scale insulin and long-acting basal insulin while inpatient. -CBGs overall well controlled. -Resume home hypoglycemic regimen and follow modified, hydrate diet. -Some elevation on his CBGs will be anticipated while receiving steroid therapy. -Continue to follow blood glucose levels/A1c with further adjustment to hypoglycemic regimen as required.   -A1c 7.0 demonstrating good diabetic control.   HLD (hyperlipidemia)- (present on admission) -Continue statins -Heart healthy diet discussed with patient. -Continue to follow LFTs and lipid panel as an outpatient.   HTN (hypertension)- (present on admission) -Stable overall -Continue current antihypertensive regimen -Continue heart healthy diet. -Reassess blood pressure at follow-up visit with further adjustment of antihypertensive treatment as needed.   Class I obesity -Body mass index is 34.44 kg/m. -Low calorie diet, portion control and increase physical activity discussed with patient.     History of Present Illness  02/03/2022  Pulmonary/ 1st office eval/ Robert Brown / [place] [facility]  Chief Complaint  Patient presents with   Hospitalization Follow-up    [facility] for pneumonia and resp failure with hypoxia  Patient states his breathing has worsened since his hosp stay Has Breo 200not using as prescribed   Baseline best day = June 2022 no meds/no 02/ still working Management consultant on IT projects but has gained 60 lbs since covid  Limited by cp from walking to MB x 50 ft  Dyspnea:  still walking to MB but it's gradually more difficult since d/c with additional concern doe and not checking sats   Cough: none  Sleep: cpap sleeps flat 2 pillow SABA use: once in last few  day, 3 h prior to OV  and not using breo correctly  Rec Stop BREO and start Trelegy one click each am  Only use your albuterol as a rescue medication Ok to try albuterol 15 min (try one puff then next day two) before an activity (on alternating days)  that you know would usually make you short of breath   We will be referring you to the heart doctors at [facility] for stable angina > to ER if worsens    05/05/2022  f/u ov/[place] office/Angellina Ferdinand re: presumed copd maint on trelegy  "when he can get it" but out at present Chief Complaint  Patient presents with   Follow-up    Had UC visit on 04/28/22 for COPD exacerbation. Still not feeling well.    Dyspnea:  baseline MB  is 50 ft up hill back s stopping and no cp now  Cough: better  Sleeping: Cpap / sleeps ok but mask doesn't fit/ f/u [facility] sleep doc SABA use: 2 puffs this am  and sev times  a day but no neb  02: none  Rec Please contact your sleep doctor to get better mask and see if cpap needs to be adjusting before adding 02  Plan A = Automatic = Always=    restart Trelegy 100 one click each am  Plan B = Backup (to supplement plan A, not to replace it) Only use your albuterol inhaler as a rescue medication Plan C = Crisis (instead of Plan B but only if Plan B stops working) - only use your albuterol nebulizer if you first try Plan B and it fails to help Keep your cardiology appt   Let me know if you want to be referred to our  sleep medicine specialist   Please schedule a follow up visit in 3 months but call sooner if needed  with  PFTS and all medications /inhalers/ solutions in hand     07/29/2022  f/u ov/[place] office/Lamont Glasscock re: AB maint on trelegy  / feels def helped doe  Chief Complaint  Patient presents with   Follow-up    He is doing better since last OV.   Dyspnea:  more out side activity/ mb better but now more limited by leg pains/ numbness esp L lateral s back pain or bowel /bladder issues  Cough: none  Sleeping: cpap  doing fine  SABA use: once qod  02: none  Covid status: vax all  Rec No change medications Work on inhaler technique:  Work on wt loss  Have your doctor refer you to an orthopedic doctor ASAP       07/30/2023 12 m  f/u ov/[place] office/Kecia Swoboda re: AB maint on trelegy  / ? Irritating mouth and throat  Chief Complaint  Patient presents with   Follow-up    1 year follow up   Dyspnea:  mb and back 100 ft with down hill s stopping  Cough: some nasal congestions since on cpap  Sleeping: on cpap bed  is flat/ 2 pillows s    resp cc  SABA use: rarely  02: none  Mouth/throat  discomfort ? From trelegy    No obvious day to day or daytime variability or assoc excess/ purulent sputum or mucus plugs or hemoptysis or cp or chest tightness, subjective wheeze or overt sinus or hb symptoms.    Also denies any obvious fluctuation of symptoms with weather or environmental changes or other aggravating or alleviating factors except as outlined above   No unusual exposure hx or h/o childhood pna/ asthma or knowledge of premature birth.  Current Allergies, Complete Past Medical History, Past Surgical History, Family History, and Social History were reviewed in [employer] record.  ROS  The following are not active complaints unless bolded Hoarseness, sore throat, dysphagia, dental problems, itching, sneezing,  nasal congestion or discharge of excess mucus or purulent secretions, ear ache,   fever, chills, sweats, unintended wt loss or wt gain, classically pleuritic or exertional cp,  orthopnea pnd or arm/hand swelling  or leg swelling, presyncope, palpitations, abdominal pain, anorexia, nausea, vomiting, diarrhea  or change in bowel habits or change in bladder habits, change in stools or change in urine, dysuria, hematuria,  rash, arthralgias, visual complaints, headache, numbness, weakness or ataxia or problems with walking or coordination,  change in mood or  memory.         Current Meds  Medication Sig   acyclovir (ZOVIRAX) 800 MG tablet SMARTSIG:1 Tablet(s) By Mouth   albuterol (PROVENTIL) (2.5 MG/3ML) 0.083% nebulizer solution Take 3 mLs (2.5 mg total) by nebulization every 6 (six) hours as needed for wheezing or shortness of breath.   albuterol (VENTOLIN HFA) 108 (90 Base) MCG/ACT inhaler Inhale 1-2 puffs into the lungs every 6 (six) hours as needed for shortness of breath or wheezing.   amoxicillin-clavulanate (AUGMENTIN) 875-125 MG tablet Take 1 tablet by mouth every 12 (twelve) hours.   aspirin EC 325 MG tablet Take 325 mg by mouth daily.   Blood Glucose Monitoring Suppl (ACCU-CHEK GUIDE) w/Device KIT Use to check glucose once daily.   busPIRone (BUSPAR) 5 MG tablet Take by mouth.   carvedilol (COREG) 25 MG tablet Take 25 mg by mouth 2 (two) times daily.  diltiazem (CARDIZEM CD) 240 MG 24 hr capsule Take 240 mg by mouth daily.   finasteride (PROSCAR) 5 MG tablet Take 5 mg by mouth daily.   Fluticasone-Umeclidin-Vilant (TRELEGY ELLIPTA) 100-62.5-25 MCG/ACT AEPB Inhale 1 puff into the lungs daily.   furosemide (LASIX) 20 MG tablet Take 20 mg by mouth daily.   gabapentin (NEURONTIN) 300 MG capsule Take 300 mg by mouth 3 (three) times daily as needed.   isosorbide mononitrate (IMDUR) 120 MG 24 hr tablet Take by mouth.   levothyroxine (SYNTHROID) 88 MCG tablet Take 1 tablet (88 mcg total) by mouth every morning.   lidocaine (LIDODERM) 5 % Place 1 patch onto the skin daily. Remove & Discard patch within 12 hours or as directed by MD   losartan (COZAAR) 100 MG tablet Take 100 mg by mouth daily.   metFORMIN (GLUCOPHAGE) 500 MG tablet Take 500 mg by mouth 2 (two) times daily.   nitroGLYCERIN (NITROSTAT) 0.4 MG SL tablet Place under the tongue.   ondansetron (ZOFRAN-ODT) 8 MG disintegrating tablet Take 8 mg by mouth every 8 (eight) hours as needed.   potassium chloride (KLOR-CON M) 10 MEQ tablet Take 10 mEq by mouth daily.   predniSONE (STERAPRED UNI-PAK 21 TAB)  10 MG (21) TBPK tablet 10 mg DS 12 as directed   rosuvastatin (CRESTOR) 20 MG tablet Take 20 mg by mouth at bedtime.   Semaglutide,0.25 or 0.5MG /DOS, (OZEMPIC, 0.25 OR 0.5 MG/DOSE,) 2 MG/3ML SOPN Inject 0.5 mg into the skin once a week.   tamsulosin (FLOMAX) 0.4 MG CAPS capsule Take 0.4 mg by mouth 2 (two) times daily.   valACYclovir (VALTREX) 1000 MG tablet Take 1,000 mg by mouth every 8 (eight) hours.   Vitamin D, Ergocalciferol, (DRISDOL) 1.25 MG (50000 UNIT) CAPS capsule Take 50,000 Units by mouth once a week.            Past Medical History:  Diagnosis Date   CAD (coronary artery disease)    Cancer (HCC)    Diabetes mellitus without complication (HCC)    Hypertension    Stroke (HCC)        Objective:    Wts  07/30/2023       222  07/29/2022       234  05/05/2022       231   04/23/22 237 lb (107.5 kg)  02/03/22 239 lb (108.4 kg)  12/20/21 240 lb (108.9 kg)      Vital signs reviewed  07/30/2023  - Note at rest 02 sats  95% on RA   General appearance:    mod obese wm nad    HEENT : Oropharynx  clear   Nasal turbinates nl    NECK :  without  apparent JVD/ palpable Nodes/TM    LUNGS: no acc muscle use,  Min barrel  contour chest wall with bilateral  slightly decreased bs s audible wheeze and  without cough on insp or exp maneuvers and min  Hyperresonant  to  percussion bilaterally    CV:  RRR  no s3 or murmur or increase in P2, and no edema   ABD: obese/  soft and nontender   MS:  Nl gait/ ext warm without deformities Or obvious joint restrictions  calf tenderness, cyanosis or clubbing     SKIN: warm and dry without lesions    NEURO:  alert, approp, nl sensorium with  no motor or cerebellar deficits apparent.  Assessment

## 2023-07-30 ENCOUNTER — Encounter: Payer: Self-pay | Admitting: Internal Medicine

## 2023-07-30 ENCOUNTER — Ambulatory Visit: Payer: Medicare HMO | Admitting: Internal Medicine

## 2023-07-30 VITALS — BP 180/80 | HR 74 | Ht 69.5 in | Wt 222.2 lb

## 2023-07-30 DIAGNOSIS — J449 Chronic obstructive pulmonary disease, unspecified: Secondary | ICD-10-CM

## 2023-07-30 MED ORDER — BREZTRI AEROSPHERE 160-9-4.8 MCG/ACT IN AERO: 2.0000 | INHALATION_SPRAY | Freq: Two times a day (BID) | RESPIRATORY_TRACT | Status: DC

## 2023-07-30 MED ORDER — BUDESONIDE-FORMOTEROL FUMARATE 80-4.5 MCG/ACT IN AERO: INHALATION_SPRAY | RESPIRATORY_TRACT | 11 refills | Status: DC

## 2023-07-30 NOTE — Patient Instructions (Addendum)
Plan A = Automatic = Always=    Symbicort 80 Take 2 puffs first thing in am and then another 2 puffs about 12 hours later. (Breztri sample is just to teach the technique)    Work on inhaler technique:  relax and gently blow all the way out then take a nice smooth full deep breath back in, triggering the inhaler at same time you start breathing in.  Hold breath in for at least  5 seconds if you can. Blow out symbicort  thru nose. Rinse and gargle with water when done.  If mouth or throat bother you at all,  try brushing teeth/gums/tongue with arm and hammer toothpaste/ make a slurry and gargle and spit out.      Plan B = Backup (to supplement plan A, not to replace it) Only use your albuterol inhaler as a rescue medication to be used if you can't catch your breath by resting or doing a relaxed purse lip breathing pattern.  - The less you use it, the better it will work when you need it. - Ok to use the inhaler up to 2 puffs  every 4 hours if you must but call for appointment if use goes up over your usual need - Don't leave home without it !!  (think of it like the spare tire for your car)   Plan C = Crisis (instead of Plan B but only if Plan B stops working) - only use your albuterol nebulizer if you first try Plan B and it fails to help > ok to use the nebulizer up to every 4 hours but if start needing it regularly call for immediate appointment  Please schedule a follow up visit in 3 months but call sooner if needed - bring inhalers

## 2023-07-30 NOTE — Assessment & Plan Note (Addendum)
Quit smoking 2008 and symptoms started p covid July 2022  -  02/03/2022   Walked on RA  x  3  lap(s) =  approx 450  ft  @ slow pace, stopped due to sob  with lowest 02 sats 91%  - 05/05/2022  After extensive coaching inhaler device,  effectiveness =    75% with DPI > continue trelegy and approp saba  - 05/05/2022   Walked on RA  x  3  lap(s) =  approx 350  ft  @ slow pace, stopped due to end of study s sob or cp  with lowest 02 sats 92%   - PFT's  07/15/22  FEV1 1.66 (51 % ) ratio 0.81  p 0 % improvement from saba p trelegy prior to study with DLCO  15.87 (61%)   and FV curve no significant curvatureand ERV 22% at wt 235    - 07/30/2023  After extensive coaching inhaler device,  effectiveness =    75% with upper airway symptoms ? From trelegy > try symbicort 80 2bid prn instead and baking soda toothpaste   He does not have significant copd but rather AB and it's mild so should do just as well with symbicort and approp saba  Discussed in detail all the  indications, usual  risks and alternatives  relative to the benefits with patient who agrees to proceed with Rx as outlined.      F/u in 3 m with all resp meds in hand using a trust but verify approach to confirm accurate Medication  Reconciliation The principal here is that until we are certain that the  patients are doing what we've asked, it makes no sense to ask them to do more.          Each maintenance medication was reviewed in detail including emphasizing most importantly the difference between maintenance and prns and under what circumstances the prns are to be triggered using an action plan format where appropriate.  Total time for H and P, chart review, counseling, reviewing hfa / dpi device(s) and generating customized AVS unique to this office visit / same day charting = 33 min

## 2023-08-12 ENCOUNTER — Encounter: Payer: Self-pay | Admitting: Internal Medicine

## 2023-08-12 ENCOUNTER — Ambulatory Visit: Payer: Medicare HMO | Attending: Internal Medicine | Admitting: Internal Medicine

## 2023-08-12 VITALS — BP 148/72 | HR 75 | Ht 70.0 in | Wt 223.2 lb

## 2023-08-12 DIAGNOSIS — I340 Nonrheumatic mitral (valve) insufficiency: Secondary | ICD-10-CM | POA: Insufficient documentation

## 2023-08-12 DIAGNOSIS — I351 Nonrheumatic aortic (valve) insufficiency: Secondary | ICD-10-CM | POA: Diagnosis not present

## 2023-08-12 DIAGNOSIS — I10 Essential (primary) hypertension: Secondary | ICD-10-CM

## 2023-08-12 DIAGNOSIS — I5032 Chronic diastolic (congestive) heart failure: Secondary | ICD-10-CM | POA: Insufficient documentation

## 2023-08-12 MED ORDER — NITROGLYCERIN 0.4 MG SL SUBL: 0.4000 mg | SUBLINGUAL_TABLET | SUBLINGUAL | 3 refills | Status: AC | PRN

## 2023-08-12 MED ORDER — EZETIMIBE 10 MG PO TABS: 10.0000 mg | ORAL_TABLET | Freq: Every day | ORAL | 3 refills | Status: DC

## 2023-08-12 MED ORDER — ASPIRIN 81 MG PO TBEC: 81.0000 mg | DELAYED_RELEASE_TABLET | Freq: Every day | ORAL | Status: DC

## 2023-08-12 NOTE — Patient Instructions (Addendum)
Medication Instructions:  Your physician has recommended you make the following change in your medication:   SL Nitroglycerin 0.4 mg tablets as needed for chest pain.  If a single episode of chest pain is not relieved by one tablet, the patient will try another within 5 minutes; and if this doesn't relieve the pain, the patient is instructed to call 911 for transportation to an emergency department.  -Stop Aspirin 325 mg  -Start Asprin 81 mg tablet once daily.   -Start Zetia 10 mg tablet once daily.    *If you need a refill on your cardiac medications before your next appointment, please call your pharmacy*   Lab Work: None If you have labs (blood work) drawn today and your tests are completely normal, you will receive your results only by: MyChart Message (if you have MyChart) OR A paper copy in the mail If you have any lab test that is abnormal or we need to change your treatment, we will call you to review the results.   Testing/Procedures: None   Follow-Up: At [facility], you and your health needs are our priority.  As part of our continuing mission to provide you with exceptional heart care, we have created designated Provider Care Teams.  These Care Teams include your primary Cardiologist (physician) and Advanced Practice Providers (APPs -  Physician Assistants and Nurse Practitioners) who all work together to provide you with the care you need, when you need it.  We recommend signing up for the patient portal called "MyChart".  Sign up information is provided on this After Visit Summary.  MyChart is used to connect with patients for Virtual Visits (Telemedicine).  Patients are able to view lab/test results, encounter notes, upcoming appointments, etc.  Non-urgent messages can be sent to your provider as well.   To learn more about what you can do with MyChart, go to [email or web address].    Your next appointment:   6 month(s)  Provider:   You may see Vishnu P  Mallipeddi, MD or one of the following Advanced Practice Providers on your designated Care Team:   [place], PA-C  Jacolyn Reedy, [place]     Other Instructions

## 2023-08-12 NOTE — Progress Notes (Signed)
Cardiology Office Note  Date: 08/12/2023   ID: Robert Brown, DOB Feb 19, 1951, MRN [id number]  PCP:  Benetta Spar, MD  Cardiologist:  Marjo Bicker, MD Electrophysiologist:  None    History of Present Illness: Robert Brown is a 72 y.o. male known to have CTO of distal RCA and distal LAD with normal LVEF (on medical management), HTN, DM 2, history of CVA presented to cardiology clinic for follow-up visit.  Patient underwent LHC in 2020 and 2022 for worsening chest pains which showed CTO of distal RCA and CTO of distal LAD with collateralization.  Medical management was recommended with aggressive antianginal therapy. Ranexa was cost prohibitive and hence is currently on BB, CCB and Imdur.  Previously was on amlodipine, was taken off due to leg swelling.  He presented today for follow-up visit.  He had to take 4 SL NTG in the last 6 months.  Denies having DOE, orthopnea, PND, leg swelling, presyncope and syncope.  He reported that his DOE symptoms significantly got better after a new inhaler was started by his PCP/pulm.  He also had diastolic dysfunction based on his echocardiogram from 2022.  Compliant with medications and has no side effects. 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 His wife has mild dementia and had to help her with ADLs including lifting her every day to the bathroom.  Past Medical History:  Diagnosis Date   CAD (coronary artery disease)    Cancer (HCC)    Diabetes mellitus without complication (HCC)    Hypertension    Stroke [facility])     Past Surgical History:  Procedure Laterality Date   bone grafts     TUMOR EXCISION      Current Outpatient Medications  Medication Sig Dispense Refill   acyclovir (ZOVIRAX) 800 MG tablet SMARTSIG:1 Tablet(s) By Mouth     albuterol (PROVENTIL) (2.5 MG/3ML) 0.083% nebulizer solution Take 3 mLs (2.5 mg total) by nebulization every 6 (six) hours as needed for wheezing or shortness of breath. 225 mL 3   albuterol (VENTOLIN HFA)  108 (90 Base) MCG/ACT inhaler Inhale 1-2 puffs into the lungs every 6 (six) hours as needed for shortness of breath or wheezing. 54 g 3   amoxicillin-clavulanate (AUGMENTIN) 875-125 MG tablet Take 1 tablet by mouth every 12 (twelve) hours. 14 tablet 0   aspirin EC 325 MG tablet Take 325 mg by mouth daily.     Blood Glucose Monitoring Suppl (ACCU-CHEK GUIDE) w/Device KIT Use to check glucose once daily. 1 kit 0   Budeson-Glycopyrrol-Formoterol (BREZTRI AEROSPHERE) 160-9-4.8 MCG/ACT AERO Inhale 2 puffs into the lungs in the morning and at bedtime.     budesonide-formoterol (SYMBICORT) 80-4.5 MCG/ACT inhaler Take 2 puffs first thing in am and then another 2 puffs about 12 hours later. 1 each 11   busPIRone (BUSPAR) 5 MG tablet Take by mouth.     carvedilol (COREG) 25 MG tablet Take 25 mg by mouth 2 (two) times daily.     diltiazem (CARDIZEM CD) 240 MG 24 hr capsule Take 240 mg by mouth daily.     finasteride (PROSCAR) 5 MG tablet Take 5 mg by mouth daily.     furosemide (LASIX) 20 MG tablet Take 20 mg by mouth daily.     gabapentin (NEURONTIN) 300 MG capsule Take 300 mg by mouth 3 (three) times daily as needed.     isosorbide mononitrate (IMDUR) 120 MG 24 hr tablet Take by mouth.     levothyroxine (SYNTHROID) 88 MCG tablet  Take 1 tablet (88 mcg total) by mouth every morning. 90 tablet 1   lidocaine (LIDODERM) 5 % Place 1 patch onto the skin daily. Remove & Discard patch within 12 hours or as directed by MD 30 patch 0   losartan (COZAAR) 100 MG tablet Take 100 mg by mouth daily.     metFORMIN (GLUCOPHAGE) 500 MG tablet Take 500 mg by mouth 2 (two) times daily.     nitroGLYCERIN (NITROSTAT) 0.4 MG SL tablet Place under the tongue.     ondansetron (ZOFRAN-ODT) 8 MG disintegrating tablet Take 8 mg by mouth every 8 (eight) hours as needed.     potassium chloride (KLOR-CON M) 10 MEQ tablet Take 10 mEq by mouth daily.     rosuvastatin (CRESTOR) 20 MG tablet Take 20 mg by mouth at bedtime.      Semaglutide,0.25 or 0.5MG /DOS, (OZEMPIC, 0.25 OR 0.5 MG/DOSE,) 2 MG/3ML SOPN Inject 0.5 mg into the skin once a week. 3 mL 3   tamsulosin (FLOMAX) 0.4 MG CAPS capsule Take 0.4 mg by mouth 2 (two) times daily.     valACYclovir (VALTREX) 1000 MG tablet Take 1,000 mg by mouth every 8 (eight) hours.     Vitamin D, Ergocalciferol, (DRISDOL) 1.25 MG (50000 UNIT) CAPS capsule Take 50,000 Units by mouth once a week.     pantoprazole (PROTONIX) 40 MG tablet Take 1 tablet (40 mg total) by mouth 2 (two) times daily. 60 tablet 1   No current facility-administered medications for this visit.   Allergies:  Alcohol, Lisinopril, and Statins   Social History: The patient  reports that he quit smoking about 16 years ago. His smoking use included cigarettes. He has never used smokeless tobacco. He reports that he does not currently use alcohol. He reports that he does not use drugs.   Family History: The patient's family history includes Diabetes in his father and mother.   ROS:  Please see the history of present illness. Otherwise, complete review of systems is positive for none.  All other systems are reviewed and negative.   Physical Exam: VS:  BP (!) 158/90   Pulse 75   Ht 5\' 10"  (1.778 m)   Wt 223 lb 3.2 oz (101.2 kg)   SpO2 95%   BMI 32.03 kg/m , BMI Body mass index is 32.03 kg/m.  Wt Readings from Last 3 Encounters:  08/12/23 223 lb 3.2 oz (101.2 kg)  07/30/23 222 lb 3.2 oz (100.8 kg)  07/03/23 218 lb 6.4 oz (99.1 kg)    General: Patient appears comfortable at rest. HEENT: Conjunctiva and lids normal, oropharynx clear with moist mucosa. Neck: Supple, no elevated JVP or carotid bruits, no thyromegaly. Lungs: Clear to auscultation, nonlabored breathing at rest. Cardiac: Regular rate and rhythm, no S3 or significant systolic murmur, no pericardial rub. Abdomen: Soft, nontender, no hepatomegaly, bowel sounds present, no guarding or rebound. Extremities: 1+ pitting edema in bilateral lower  extremities, distal pulses 2+. Skin: Warm and dry. Musculoskeletal: No kyphosis. Neuropsychiatric: Alert and oriented x3, affect grossly appropriate.  ECG: NSR  Recent Labwork: 06/28/2023: BUN 21; Creatinine, Ser 1.08; Hemoglobin 13.6; Platelets 158; Potassium 3.4; Sodium 137     Component Value Date/Time   TRIG 163 (A) 08/04/2022 0000   LDLCALC 142 08/04/2022 0000    Other Studies Reviewed Today: LHC in 2022 at [facility] LAD lesion is 100% stenosed.   Prox LAD to Mid LAD lesion is 20% stenosed.   Mid RCA to Dist RCA lesion is 100%  stenosed.  The angiogram was most notable for chronic occlusion of the distal/apical  LAD and distal RCA.  There is otherwise scattered mild, nonocclusive  disease of the circumflex and proximal LAD.  Findings are unchanged from  the previous study 2 years ago.  Pursue aggressive medical therapy for angina prophylaxis and secondary  prevention.   Echocardiogram in 09/2021 Left Ventricle: Systolic function is normal. EF: 60-65%.    Left Ventricle: Doppler parameters consistent with moderate diastolic  dysfunction and elevated LA pressure.    Left Atrium: Left atrium is mildly dilated.    Mitral Valve: There is mild regurgitation.    Aortic Valve: Trace to mild aortic valve regurgitation   Assessment and Plan: Patient is a 72 year old M known to have CTO of distal RCA and distal LAD with normal LVEF (on medical management), HTN, DM 2, history of CVA presented to cardiology clinic for follow-up visit.  CAD (CTO distal RCA and distal LAD), medical management: He had to take 4 pills of SL NTG in the last 6 months. Stable angina.  Switch aspirin 325 mg to 81 mg once daily, rosuvastatin 20 mg nightly, add Zetia 10 mg once daily due to LDL not at goal.  Continue antianginal therapy with diltiazem 240 mg once daily, carvedilol 25 mg twice daily, Imdur 120 mg once daily. Ranexa cost prohibitive. Taken off amlodipine due to leg swelling.  HLD, not at  goal: LDL 120s, add Zetia 10 mg once daily to rosuvastatin 20 mg nightly.   Chronic diastolic heart failure: Echocardiogram from 2022 at [facility] showed normal LVEF, moderate diastolic dysfunction with elevated LA pressure, mild MR and mild AR.  Continue Lasix 20 mg once daily.  Valvular heart disease (mild MR and mild AR in 2022): Repeat echocardiogram every 3 to 5 years.  Next echocardiogram will be in 2025.  HTN, controlled: Continue above-stated medications in addition to losartan 100 mg once daily.     Disposition:  Follow up  6 months  Signed, Orlanda Lemmerman Verne Spurr, MD, 08/12/2023 1:26 PM     [facility] [address], [place], [place] [address]

## 2023-08-24 DIAGNOSIS — E119 Type 2 diabetes mellitus without complications: Secondary | ICD-10-CM | POA: Diagnosis not present

## 2023-08-24 DIAGNOSIS — E559 Vitamin D deficiency, unspecified: Secondary | ICD-10-CM | POA: Diagnosis not present

## 2023-08-24 DIAGNOSIS — E039 Hypothyroidism, unspecified: Secondary | ICD-10-CM | POA: Diagnosis not present

## 2023-08-25 LAB — COMPREHENSIVE METABOLIC PANEL
ALT: 26 [IU]/L (ref 0–44)
AST: 21 [IU]/L (ref 0–40)
Albumin: 4.3 g/dL (ref 3.8–4.8)
Alkaline Phosphatase: 125 [IU]/L — ABNORMAL HIGH (ref 44–121)
BUN/Creatinine Ratio: 14 (ref 10–24)
BUN: 17 mg/dL (ref 8–27)
Bilirubin Total: 0.2 mg/dL (ref 0.0–1.2)
CO2: 28 mmol/L (ref 20–29)
Calcium: 9.5 mg/dL (ref 8.6–10.2)
Chloride: 101 mmol/L (ref 96–106)
Creatinine, Ser: 1.23 mg/dL (ref 0.76–1.27)
Globulin, Total: 2.6 g/dL (ref 1.5–4.5)
Glucose: 117 mg/dL — ABNORMAL HIGH (ref 70–99)
Potassium: 3.8 mmol/L (ref 3.5–5.2)
Sodium: 143 mmol/L (ref 134–144)
Total Protein: 6.9 g/dL (ref 6.0–8.5)
eGFR: 62 mL/min/{1.73_m2} (ref >59)

## 2023-08-25 LAB — TSH: TSH: 3.11 u[IU]/mL (ref 0.450–4.500)

## 2023-08-25 LAB — VITAMIN D 25 HYDROXY (VIT D DEFICIENCY, FRACTURES): Vit D, 25-Hydroxy: 34 ng/mL (ref 30.0–100.0)

## 2023-08-25 LAB — T4, FREE: Free T4: 1.33 ng/dL (ref 0.82–1.77)

## 2023-08-27 DIAGNOSIS — I10 Essential (primary) hypertension: Secondary | ICD-10-CM | POA: Diagnosis not present

## 2023-08-27 DIAGNOSIS — E1165 Type 2 diabetes mellitus with hyperglycemia: Secondary | ICD-10-CM | POA: Diagnosis not present

## 2023-08-31 ENCOUNTER — Ambulatory Visit (INDEPENDENT_AMBULATORY_CARE_PROVIDER_SITE_OTHER): Payer: Medicare HMO | Admitting: Nurse Practitioner

## 2023-08-31 ENCOUNTER — Encounter: Payer: Self-pay | Admitting: Nurse Practitioner

## 2023-08-31 VITALS — BP 130/80 | HR 75 | Ht 70.0 in | Wt 221.8 lb

## 2023-08-31 DIAGNOSIS — E559 Vitamin D deficiency, unspecified: Secondary | ICD-10-CM

## 2023-08-31 DIAGNOSIS — E039 Hypothyroidism, unspecified: Secondary | ICD-10-CM | POA: Diagnosis not present

## 2023-08-31 DIAGNOSIS — E119 Type 2 diabetes mellitus without complications: Secondary | ICD-10-CM

## 2023-08-31 DIAGNOSIS — Z7984 Long term (current) use of oral hypoglycemic drugs: Secondary | ICD-10-CM

## 2023-08-31 DIAGNOSIS — Z7985 Long-term (current) use of injectable non-insulin antidiabetic drugs: Secondary | ICD-10-CM | POA: Diagnosis not present

## 2023-08-31 LAB — POCT UA - MICROALBUMIN: Albumin/Creatinine Ratio, Urine, POC: <30

## 2023-08-31 LAB — POCT GLYCOSYLATED HEMOGLOBIN (HGB A1C): Hemoglobin A1C: 6.9 % — AB (ref 4.0–5.6)

## 2023-08-31 MED ORDER — LEVOTHYROXINE SODIUM 88 MCG PO TABS: 88.0000 ug | ORAL_TABLET | Freq: Every morning | ORAL | 1 refills | Status: DC

## 2023-08-31 MED ORDER — METFORMIN HCL 500 MG PO TABS: 500.0000 mg | ORAL_TABLET | Freq: Two times a day (BID) | ORAL | 3 refills | Status: DC

## 2023-08-31 NOTE — Progress Notes (Signed)
Endocrinology Follow Up Note       08/31/2023, 3:41 PM   Subjective:    Patient ID: Robert Brown, male    DOB: [birth year].  Robert Brown is being seen in follow up after being seen in consultation for management of currently uncontrolled symptomatic diabetes requested by  Benetta Spar, MD.   Past Medical History:  Diagnosis Date   CAD (coronary artery disease)    Cancer (HCC)    Diabetes mellitus without complication (HCC)    Hypertension    Stroke [facility])     Past Surgical History:  Procedure Laterality Date   bone grafts     TUMOR EXCISION      Social History   Socioeconomic History   Marital status: Married    Spouse name: Not on file   Number of children: Not on file   Years of education: Not on file   Highest education level: Not on file  Occupational History   Not on file  Tobacco Use   Smoking status: Former    Current packs/day: 0.00    Types: Cigarettes    Quit date: 11/17/2006    Years since quitting: 16.7   Smokeless tobacco: Never  Vaping Use   Vaping status: Never Used  Substance and Sexual Activity   Alcohol use: Not Currently    Comment: former drinker   Drug use: Never   Sexual activity: Not Currently  Other Topics Concern   Not on file  Social History Narrative   Not on file   Social Determinants of Health   Financial Resource Strain: High Risk (02/27/2022)   Received from [facility], [facility]   Overall Financial Resource Strain (CARDIA)    Difficulty of Paying Living Expenses: Hard  Food Insecurity: Food Insecurity Present (02/27/2022)   Received from [facility], [facility]   Hunger Vital Sign    Worried About Running Out of Food in the Last Year: Sometimes true    Ran Out of Food in the Last Year: Never true  Transportation Needs: Patient Declined (02/27/2022)   Received from [facility], [facility]   [facility] - Transportation    Lack of  Transportation (Medical): Patient declined    Lack of Transportation (Non-Medical): Patient declined  Physical Activity: Insufficiently Active (02/27/2022)   Received from [facility], [facility]   Exercise Vital Sign    Days of Exercise per Week: 1 day    Minutes of Exercise per Session: 60 min  Stress: Stress Concern Present (02/27/2022)   Received from [facility], [facility]   [employer] [facility] - Occupational Stress Questionnaire    Feeling of Stress : To some extent  Social Connections: Unknown (03/01/2023)   Received from [facility], [facility]   Social Network    Social Network: Not on file    Family History  Problem Relation Age of Onset   Diabetes Mother    Diabetes Father     Outpatient Encounter Medications as of 08/31/2023  Medication Sig   acyclovir (ZOVIRAX) 800 MG tablet SMARTSIG:1 Tablet(s) By Mouth   albuterol (PROVENTIL) (2.5 MG/3ML) 0.083% nebulizer solution Take 3 mLs (2.5 mg total) by nebulization every  6 (six) hours as needed for wheezing or shortness of breath.   albuterol (VENTOLIN HFA) 108 (90 Base) MCG/ACT inhaler Inhale 1-2 puffs into the lungs every 6 (six) hours as needed for shortness of breath or wheezing.   aspirin EC 81 MG tablet Take 1 tablet (81 mg total) by mouth daily. Swallow whole.   Blood Glucose Monitoring Suppl (ACCU-CHEK GUIDE) w/Device KIT Use to check glucose once daily.   Budeson-Glycopyrrol-Formoterol (BREZTRI AEROSPHERE) 160-9-4.8 MCG/ACT AERO Inhale 2 puffs into the lungs in the morning and at bedtime.   budesonide-formoterol (SYMBICORT) 80-4.5 MCG/ACT inhaler Take 2 puffs first thing in am and then another 2 puffs about 12 hours later.   busPIRone (BUSPAR) 5 MG tablet Take by mouth.   carvedilol (COREG) 25 MG tablet Take 25 mg by mouth 2 (two) times daily.   diltiazem (CARDIZEM CD) 240 MG 24 hr capsule Take 240 mg by mouth daily.   ezetimibe (ZETIA) 10 MG tablet Take 1 tablet (10 mg total) by  mouth daily.   finasteride (PROSCAR) 5 MG tablet Take 5 mg by mouth daily.   furosemide (LASIX) 20 MG tablet Take 20 mg by mouth daily.   gabapentin (NEURONTIN) 300 MG capsule Take 300 mg by mouth 3 (three) times daily as needed.   isosorbide mononitrate (IMDUR) 120 MG 24 hr tablet Take by mouth.   lidocaine (LIDODERM) 5 % Place 1 patch onto the skin daily. Remove & Discard patch within 12 hours or as directed by MD   losartan (COZAAR) 100 MG tablet Take 100 mg by mouth daily.   nitroGLYCERIN (NITROSTAT) 0.4 MG SL tablet Place 1 tablet (0.4 mg total) under the tongue every 5 (five) minutes as needed for chest pain. If a single episode of chest pain is not relieved by one tablet, the patient will try another within 5 minutes; and if this doesn't relieve the pain, the patient is instructed to call 911 for transportation to an emergency department.   ondansetron (ZOFRAN-ODT) 8 MG disintegrating tablet Take 8 mg by mouth every 8 (eight) hours as needed.   potassium chloride (KLOR-CON M) 10 MEQ tablet Take 10 mEq by mouth daily.   rosuvastatin (CRESTOR) 20 MG tablet Take 20 mg by mouth at bedtime.   tamsulosin (FLOMAX) 0.4 MG CAPS capsule Take 0.4 mg by mouth 2 (two) times daily.   valACYclovir (VALTREX) 1000 MG tablet Take 1,000 mg by mouth every 8 (eight) hours.   Vitamin D, Ergocalciferol, (DRISDOL) 1.25 MG (50000 UNIT) CAPS capsule Take 50,000 Units by mouth once a week.   [DISCONTINUED] levothyroxine (SYNTHROID) 88 MCG tablet Take 1 tablet (88 mcg total) by mouth every morning.   [DISCONTINUED] metFORMIN (GLUCOPHAGE) 500 MG tablet Take 500 mg by mouth 2 (two) times daily.   [DISCONTINUED] Semaglutide,0.25 or 0.5MG /DOS, (OZEMPIC, 0.25 OR 0.5 MG/DOSE,) 2 MG/3ML SOPN Inject 0.5 mg into the skin once a week.   amoxicillin-clavulanate (AUGMENTIN) 875-125 MG tablet Take 1 tablet by mouth every 12 (twelve) hours. (Patient not taking: Reported on 08/31/2023)   levothyroxine (SYNTHROID) 88 MCG tablet Take 1  tablet (88 mcg total) by mouth every morning.   metFORMIN (GLUCOPHAGE) 500 MG tablet Take 1 tablet (500 mg total) by mouth 2 (two) times daily.   pantoprazole (PROTONIX) 40 MG tablet Take 1 tablet (40 mg total) by mouth 2 (two) times daily.   No facility-administered encounter medications on file as of 08/31/2023.    ALLERGIES: Allergies  Allergen Reactions   Alcohol Other (See Comments)  Lisinopril     Medicines ending in -pril   Statins     VACCINATION STATUS: Immunization History  Administered Date(s) Administered   Fluad Quad(high Dose 65+) 09/10/2020   Influenza, High Dose Seasonal PF 09/06/2018, 10/11/2019, 08/28/2021   PFIZER Comirnaty(Gray Top)Covid-19 Tri-Sucrose Vaccine 04/01/2021   PFIZER(Purple Top)SARS-COV-2 Vaccination 03/12/2020, 04/06/2020, 10/22/2020   Pfizer Covid-19 Vaccine Bivalent Booster 37yrs & up 09/04/2021   Pneumococcal Conjugate-13 06/18/2015   Pneumococcal Polysaccharide-23 06/23/2017, 06/23/2017   Tdap 02/22/2020    Diabetes He presents for his follow-up diabetic visit. He has type 2 diabetes mellitus. His disease course has been stable. There are no hypoglycemic associated symptoms. There are no diabetic associated symptoms. There are no hypoglycemic complications. Symptoms are stable. Diabetic complications include a CVA, heart disease (CAD and CHF), nephropathy and peripheral neuropathy. Risk factors for coronary artery disease include diabetes mellitus, dyslipidemia, family history, obesity, male sex, hypertension and sedentary lifestyle. Current diabetic treatment includes oral agent (monotherapy) (and Ozempic). He is compliant with treatment most of the time. His weight is fluctuating minimally. He is following a generally healthy diet. When asked about meal planning, he reported none. He has not had a previous visit with a dietitian. He rarely participates in exercise. His home blood glucose trend is fluctuating minimally. His overall blood glucose  range is 130-140 mg/dl. (He presents today with his CGM showing at goal glycemic profile overall.  His POCT A1c today is 6.9%, essentially unchanged from previous visit.  He denies any significant hypoglycemia.   Analysis of his CGM shows TIR 90%, TAR 9%, TBR 0%. ) An ACE inhibitor/angiotensin II receptor blocker is being taken. He does not see a podiatrist.Eye exam is current.     Review of systems  Constitutional: + minimally fluctuating body weight, current Body mass index is 31.82 kg/m., no fatigue, no subjective hyperthermia, no subjective hypothermia Eyes: no blurry vision, no xerophthalmia ENT: no sore throat, no nodules palpated in throat, no dysphagia/odynophagia, no hoarseness Cardiovascular: no chest pain, no shortness of breath, no palpitations, no leg swelling Respiratory: no cough, no shortness of breath Gastrointestinal: no nausea/vomiting/diarrhea Musculoskeletal: no muscle/joint aches Skin: no rashes, no hyperemia Neurological: no tremors, no numbness, no tingling, no dizziness Psychiatric: no depression, no anxiety  Objective:     BP 130/80 (BP Location: Left Arm, Patient Position: Sitting, Cuff Size: Large)   Pulse 75   Ht 5\' 10"  (1.778 m)   Wt 221 lb 12.8 oz (100.6 kg)   BMI 31.82 kg/m   Wt Readings from Last 3 Encounters:  08/31/23 221 lb 12.8 oz (100.6 kg)  08/12/23 223 lb 3.2 oz (101.2 kg)  07/30/23 222 lb 3.2 oz (100.8 kg)     BP Readings from Last 3 Encounters:  08/31/23 130/80  08/12/23 (!) 148/72  07/30/23 (!) 180/80      Physical Exam- Limited  Constitutional:  Body mass index is 31.82 kg/m. , not in acute distress, normal state of mind Eyes:  EOMI, no exophthalmos Musculoskeletal: no gross deformities, strength intact in all four extremities, no gross restriction of joint movements Skin:  no rashes, no hyperemia Neurological: no tremor with outstretched hands   Diabetic Foot Exam - Simple   Simple Foot Form Diabetic Foot exam was  performed with the following findings: Yes 08/31/2023  3:19 PM  Visual Inspection See comments: Yes Sensation Testing Intact to touch and monofilament testing bilaterally: Yes Pulse Check Posterior Tibialis and Dorsalis pulse intact bilaterally: Yes Comments Onychomycosis to nails on left foot  CMP ( most recent) CMP     Component Value Date/Time   NA 143 08/24/2023 1437   K 3.8 08/24/2023 1437   CL 101 08/24/2023 1437   CO2 28 08/24/2023 1437   GLUCOSE 117 (H) 08/24/2023 1437   GLUCOSE 122 (H) 06/28/2023 1815   BUN 17 08/24/2023 1437   CREATININE 1.23 08/24/2023 1437   CALCIUM 9.5 08/24/2023 1437   PROT 6.9 08/24/2023 1437   ALBUMIN 4.3 08/24/2023 1437   AST 21 08/24/2023 1437   ALT 26 08/24/2023 1437   ALKPHOS 125 (H) 08/24/2023 1437   BILITOT 0.2 08/24/2023 1437   GFRNONAA >60 06/28/2023 1815   GFRAA >60 06/21/2020 1510     Diabetic Labs (most recent): Lab Results  Component Value Date   HGBA1C 6.9 (A) 08/31/2023   HGBA1C 6.8 (A) 05/29/2023   HGBA1C 6.8 (A) 05/28/2023   MICROALBUR 30mg /L 08/31/2023     Lipid Panel ( most recent) Lipid Panel     Component Value Date/Time   TRIG 163 (A) 08/04/2022 0000   LDLCALC 142 08/04/2022 0000      Lab Results  Component Value Date   TSH 3.110 08/24/2023   TSH 3.96 08/04/2022   FREET4 1.33 08/24/2023           Assessment & Plan:   1) Type 2 Diabetes mellitus without complication without long-term current use of insulin  He presents today with his CGM showing at goal glycemic profile overall.  His POCT A1c today is 6.9%, essentially unchanged from previous visit.  He denies any significant hypoglycemia.   Analysis of his CGM shows TIR 90%, TAR 9%, TBR 0%.   - Andr… Brading has currently uncontrolled symptomatic type 2 DM since 72 years of age.   -Recent labs reviewed.  - I had a long discussion with him about the progressive nature of diabetes and the pathology behind its complications. -his  diabetes is complicated by CAD with MI, CVA, CKD stage 2, neuropathy and he remains at a high risk for more acute and chronic complications which include CAD, CVA, CKD, retinopathy, and neuropathy. These are all discussed in detail with him.  The following Lifestyle Medicine recommendations according to American College of Lifestyle Medicine [facility]) were discussed and offered to patient and he agrees to start the journey:  A. Whole Foods, Plant-based plate comprising of fruits and vegetables, plant-based proteins, whole-grain carbohydrates was discussed in detail with the patient.   A list for source of those nutrients were also provided to the patient.  Patient will use only water or unsweetened tea for hydration. B.  The need to stay away from risky substances including alcohol, smoking; obtaining 7 to 9 hours of restorative sleep, at least 150 minutes of moderate intensity exercise weekly, the importance of healthy social connections,  and stress reduction techniques were discussed. C.  A full color page of  Calorie density of various food groups per pound showing examples of each food groups was provided to the patient.  - Nutritional counseling repeated at each appointment due to patients tendency to fall back in to old habits.  - The patient admits there is a room for improvement in their diet and drink choices. -  Suggestion is made for the patient to avoid simple carbohydrates from their diet including Cakes, Sweet Desserts / Pastries, Ice Cream, Soda (diet and regular), Sweet Tea, Candies, Chips, Cookies, Sweet Pastries, Store Bought Juices, Alcohol in Excess of 1-2 drinks a day, Artificial Sweeteners, Coffee Creamer, and "  Sugar-free" Products. This will help patient to have stable blood glucose profile and potentially avoid unintended weight gain.   - I encouraged the patient to switch to unprocessed or minimally processed complex starch and increased protein intake (animal or plant source),  fruits, and vegetables.   - Patient is advised to stick to a routine mealtimes to eat 3 meals a day and avoid unnecessary snacks (to snack only to correct hypoglycemia).  - I have approached him with the following individualized plan to manage his diabetes and patient agrees:   -Will increase his Ozempic to 1 mg SQ weekly (gets this from PAP so next shipment will be the 1 mg doses).  He can double inject his 0.5 mg dose to use up current supply once he has the 1 mg pens in hand.  He is advised to continue Metformin 500 mg po twice daily with meals.   He could most certainly benefit from GLP1 product given his extensive CAD history with MI and CVA in the past.  -he is encouraged to continue monitoring glucose once daily daily, before breakfast, and to call the clinic if he had readings less than 70 or above 300 for 3 tests in a row.  - he is warned not to take insulin without proper monitoring per orders. - Adjustment parameters are given to him for hypo and hyperglycemia in writing.  - he is not a candidate for full dose Metformin due to concurrent renal insufficiency.  - Specific targets for  A1c; LDL, HDL, and Triglycerides were discussed with the patient.  2) Blood Pressure /Hypertension:  his blood pressure is controlled to target.   he is advised to continue his current medications including Losartan 100 mg po daily, Lasix 20 mg po daily,  Imdur 120 mg p.o. daily with breakfast, Coreg 25 mg po twice daily, and Diltiazem 240 mg po daily.  3) Lipids/Hyperlipidemia:    Review of his recent lipid panel from 01/28/23 showed uncontrolled LDL at 126 .  he is advised to continue Crestor 20 mg daily at bedtime.  Side effects and precautions discussed with him.  4)  Weight/Diet:  his Body mass index is 31.82 kg/m.  -  clearly complicating his diabetes care.   he is a candidate for weight loss. I discussed with him the fact that loss of 5 - 10% of his  current body weight will have the most impact  on his diabetes management.  Exercise, and detailed carbohydrates information provided  -  detailed on discharge instructions.  5) Hypothyroidism-unspecified The details surrounding his diagnosis are not available.  He denies any known family history of thyroid problems.   His previsit TFTs are consistent with appropriate hormone replacement.  He is advised to continue Levothyroxine 88 mcg po daily before breakfast.     - The correct intake of thyroid hormone (Levothyroxine, Synthroid), is on empty stomach first thing in the morning, with water, separated by at least 30 minutes from breakfast and other medications,  and separated by more than 4 hours from calcium, iron, multivitamins, acid reflux medications (PPIs).  - This medication is a life-long medication and will be needed to correct thyroid hormone imbalances for the rest of your life.  The dose may change from time to time, based on thyroid blood work.  - It is extremely important to be consistent taking this medication, near the same time each morning.  -AVOID TAKING PRODUCTS CONTAINING BIOTIN (commonly found in Hair, Skin, Nails vitamins) AS IT INTERFERES  WITH THE VALIDITY OF THYROID FUNCTION BLOOD TESTS.  6) Chronic Care/Health Maintenance: -he is on ACEI/ARB and Statin medications and is encouraged to initiate and continue to follow up with Ophthalmology, Dentist, Podiatrist at least yearly or according to recommendations, and advised to stay away from smoking. I have recommended yearly flu vaccine and pneumonia vaccine at least every 5 years; moderate intensity exercise for up to 150 minutes weekly; and sleep for at least 7 hours a day.  - he is advised to maintain close follow up with Benetta Spar, MD for primary care needs, as well as his other providers for optimal and coordinated care.     I spent  45  minutes in the care of the patient today including review of labs from CMP, Lipids, Thyroid Function, Hematology  (current and previous including abstractions from other facilities); face-to-face time discussing  his blood glucose readings/logs, discussing hypoglycemia and hyperglycemia episodes and symptoms, medications doses, his options of short and long term treatment based on the latest standards of care / guidelines;  discussion about incorporating lifestyle medicine;  and documenting the encounter. Risk reduction counseling performed per USPSTF guidelines to reduce obesity and cardiovascular risk factors.     Please refer to Patient Instructions for Blood Glucose Monitoring and Insulin/Medications Dosing Guide"  in media tab for additional information. Please  also refer to " Patient Self Inventory" in the Media  tab for reviewed elements of pertinent patient history.  Georgetta Haber participated in the discussions, expressed understanding, and voiced agreement with the above plans.  All questions were answered to his satisfaction. he is encouraged to contact clinic should he have any questions or concerns prior to his return visit.     Follow up plan: - Return in about 3 months (around 12/01/2023) for Diabetes F/U with A1c in office, No previsit labs, Bring meter and logs.    Ronny Bacon, [facility] [address] [place], [place] [address] Phone: [phone] Fax: [phone]  08/31/2023, 3:41 PM

## 2023-08-31 NOTE — Patient Instructions (Signed)

## 2023-09-09 IMAGING — DX DG CHEST 2V
2 series · 2 of 2 positions shown · non-contrast
Comparison: 12/20/2021, 05/25/2021

CLINICAL DATA: 70-year-old male with a history of dyspnea on
exertion

EXAM:
CHEST - 2 VIEW

[chest pa]
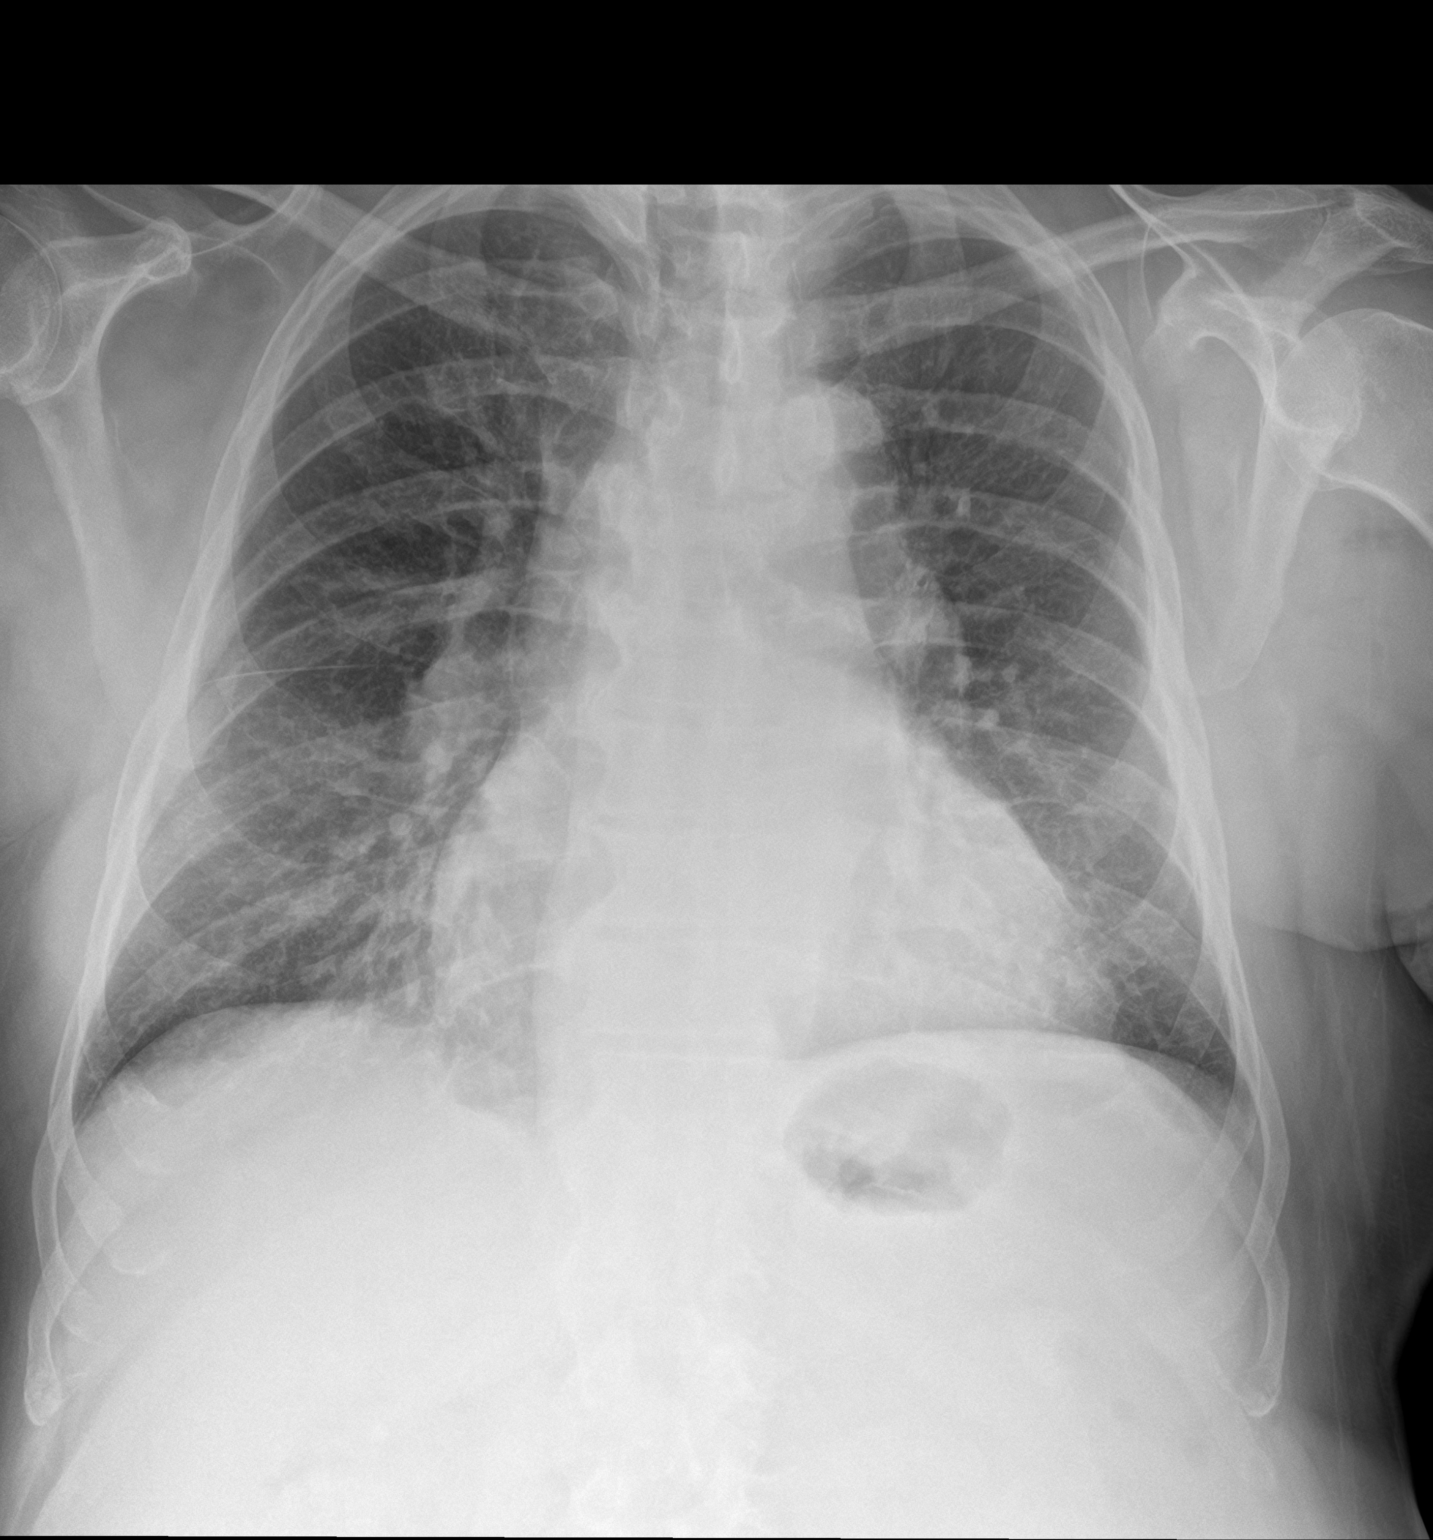

[chest lat]
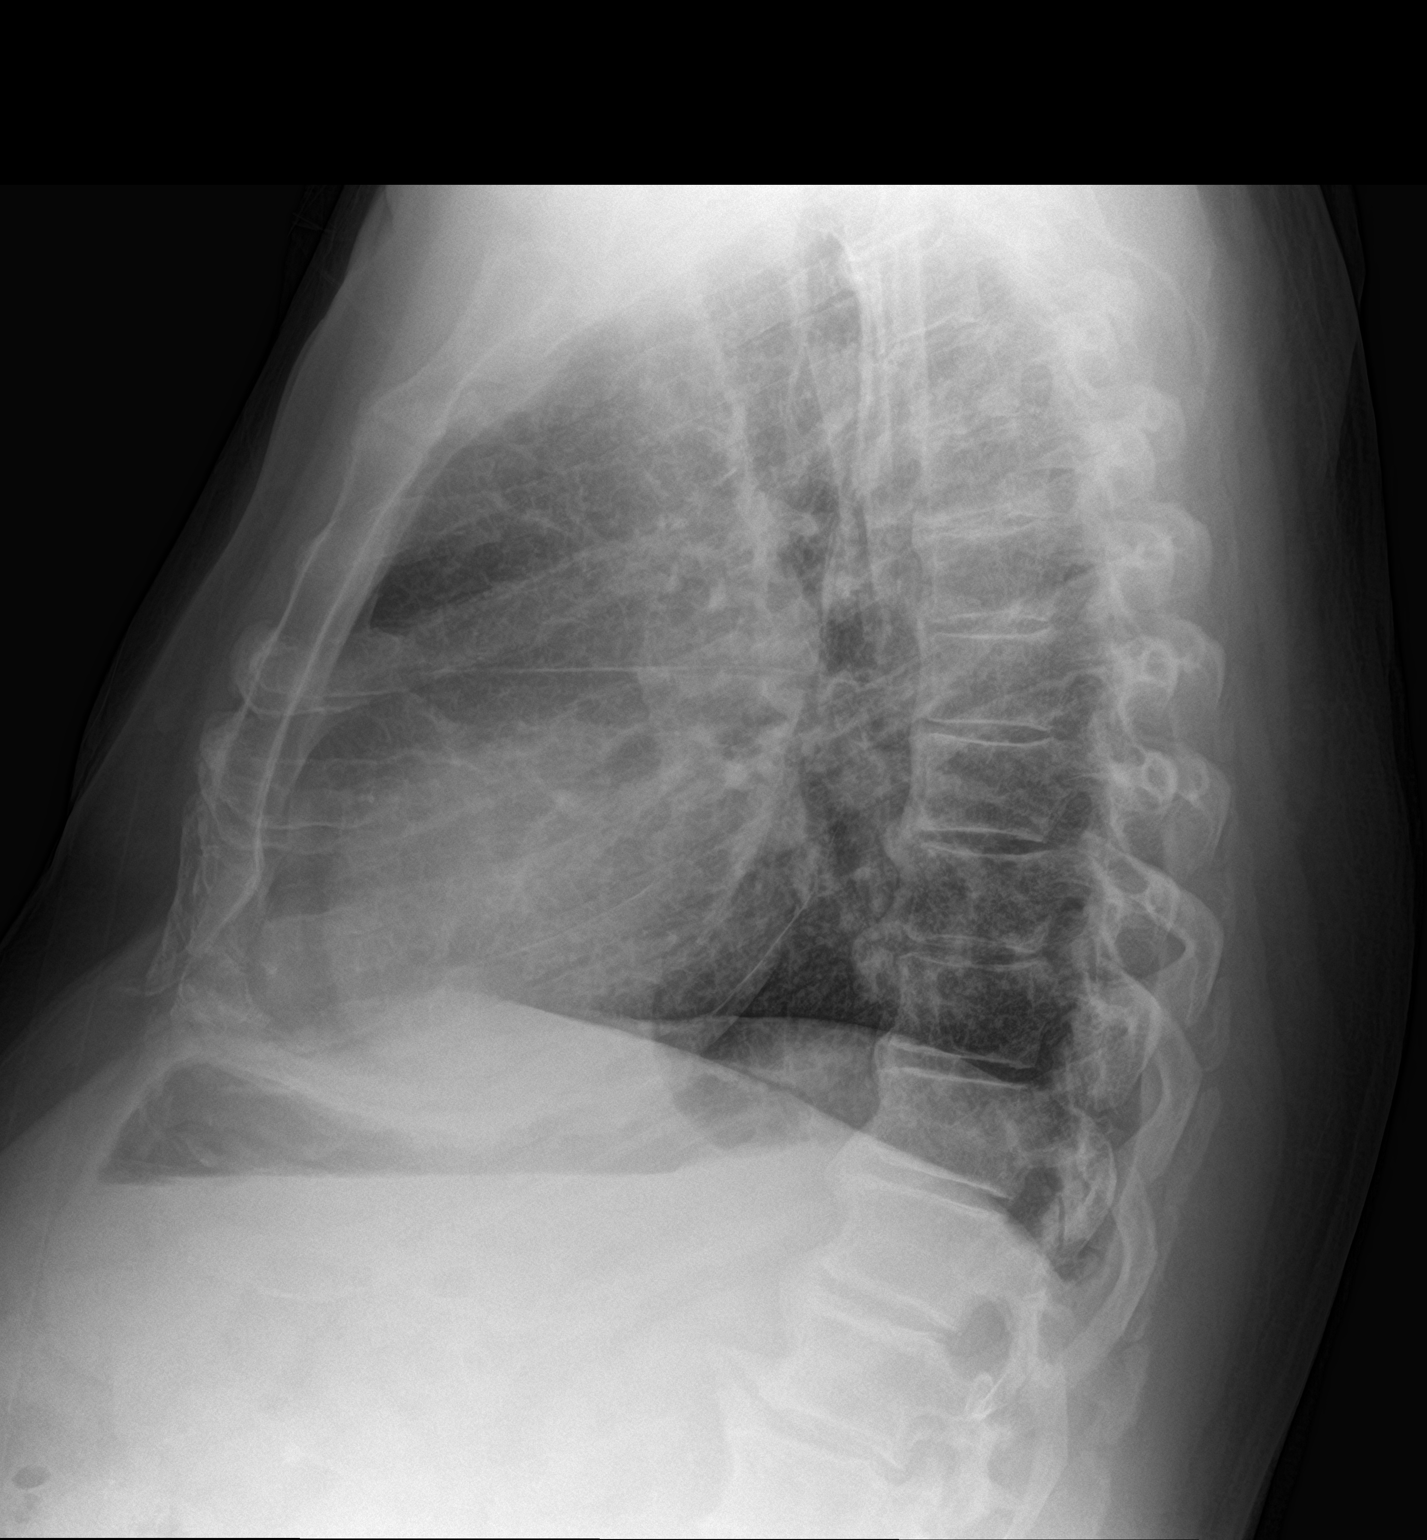

[2 of 2 positions shown; findings below may reference images not displayed]

FINDINGS: Cardiomediastinal silhouette unchanged in size and contour. No
evidence of central vascular congestion. No interlobular septal
thickening.

Bronchial wall thickening with infrahilar reticulonodular opacities
bilaterally.

No pneumothorax or pleural effusion. Coarsened interstitial
markings.

No acute displaced fracture. Degenerative changes of the spine.
IMPRESSION: Reticulonodular opacities in the infrahilar regions bilaterally,
concerning for ongoing multifocal infection/bronchitis when compared
to prior CT chest 12/20/2021.

## 2023-09-15 ENCOUNTER — Telehealth: Payer: Self-pay | Admitting: Nurse Practitioner

## 2023-09-15 NOTE — Telephone Encounter (Signed)
Called pt and let him know that his pt assistance ozempic is here ready for pick up

## 2023-09-15 NOTE — Telephone Encounter (Signed)
Patient picked up his ozempic

## 2023-09-27 DIAGNOSIS — E1165 Type 2 diabetes mellitus with hyperglycemia: Secondary | ICD-10-CM | POA: Diagnosis not present

## 2023-09-27 DIAGNOSIS — I10 Essential (primary) hypertension: Secondary | ICD-10-CM | POA: Diagnosis not present

## 2023-10-08 DIAGNOSIS — E08649 Diabetes mellitus due to underlying condition with hypoglycemia without coma: Secondary | ICD-10-CM | POA: Diagnosis not present

## 2023-10-08 DIAGNOSIS — E1165 Type 2 diabetes mellitus with hyperglycemia: Secondary | ICD-10-CM | POA: Diagnosis not present

## 2023-10-13 ENCOUNTER — Encounter: Payer: Self-pay | Admitting: Internal Medicine

## 2023-10-13 ENCOUNTER — Ambulatory Visit (INDEPENDENT_AMBULATORY_CARE_PROVIDER_SITE_OTHER): Payer: Medicare HMO | Admitting: Internal Medicine

## 2023-10-13 VITALS — BP 137/65 | HR 82 | Ht 70.0 in | Wt 223.4 lb

## 2023-10-13 DIAGNOSIS — G4733 Obstructive sleep apnea (adult) (pediatric): Secondary | ICD-10-CM | POA: Insufficient documentation

## 2023-10-13 DIAGNOSIS — I2510 Atherosclerotic heart disease of native coronary artery without angina pectoris: Secondary | ICD-10-CM

## 2023-10-13 DIAGNOSIS — Z8673 Personal history of transient ischemic attack (TIA), and cerebral infarction without residual deficits: Secondary | ICD-10-CM | POA: Insufficient documentation

## 2023-10-13 DIAGNOSIS — J449 Chronic obstructive pulmonary disease, unspecified: Secondary | ICD-10-CM | POA: Diagnosis not present

## 2023-10-13 DIAGNOSIS — E039 Hypothyroidism, unspecified: Secondary | ICD-10-CM

## 2023-10-13 DIAGNOSIS — Z23 Encounter for immunization: Secondary | ICD-10-CM | POA: Diagnosis not present

## 2023-10-13 DIAGNOSIS — Z8546 Personal history of malignant neoplasm of prostate: Secondary | ICD-10-CM | POA: Insufficient documentation

## 2023-10-13 DIAGNOSIS — I5030 Unspecified diastolic (congestive) heart failure: Secondary | ICD-10-CM

## 2023-10-13 DIAGNOSIS — N400 Enlarged prostate without lower urinary tract symptoms: Secondary | ICD-10-CM | POA: Insufficient documentation

## 2023-10-13 DIAGNOSIS — I10 Essential (primary) hypertension: Secondary | ICD-10-CM

## 2023-10-13 DIAGNOSIS — E785 Hyperlipidemia, unspecified: Secondary | ICD-10-CM

## 2023-10-13 DIAGNOSIS — E1159 Type 2 diabetes mellitus with other circulatory complications: Secondary | ICD-10-CM

## 2023-10-13 NOTE — Assessment & Plan Note (Signed)
He endorses a history of CVA.  No residual deficits appreciated on exam today.  Currently on ASA and statin therapy.

## 2023-10-13 NOTE — Assessment & Plan Note (Signed)
LDL 142 on the latest available labs from September 2023.  He is currently prescribed rosuvastatin 20 mg daily and Zetia 10 mg daily was recently added.  Repeat lipid panel at follow-up in 3 months.

## 2023-10-13 NOTE — Assessment & Plan Note (Signed)
He endorses nightly compliance with CPAP.  Previously followed by [facility] but is transitioning to [facility] health sleep medicine clinic.

## 2023-10-13 NOTE — Assessment & Plan Note (Signed)
Followed by endocrinology.  Currently prescribed levothyroxine 88 mcg daily.  TSH WNL when last updated

## 2023-10-13 NOTE — Assessment & Plan Note (Signed)
Followed by urology.  Symptoms are adequately controlled with Flomax and Proscar.

## 2023-10-13 NOTE — Patient Instructions (Addendum)
It was a pleasure to see you today.  Thank you for giving [place] the opportunity to be involved in your care.  Below is a brief recap of your visit and next steps.  We will plan to see you again in 3 months.  Summary You have established care today No medication changes were made No labs ordered Flu shot today Follow up in 3 months

## 2023-10-13 NOTE — Assessment & Plan Note (Signed)
He is currently prescribed Lasix 20 mg daily as needed.  Euvolemic on exam today.  No medication changes are indicated at this time.

## 2023-10-13 NOTE — Progress Notes (Signed)
New Patient Office Visit  Subjective    Patient ID: Robert Brown, male    DOB: May 17, 1951  Age: 72 y.o. MRN: [id number]  CC:  Chief Complaint  Patient presents with   Establish Care   HPI Robert Brown presents to establish care.  He is a 72 year old male with a previously documented past medical history significant for HTN, CAD, T2DM, HLD, OSA, COPD, prostate cancer, BPH, hypothyroidism, HFpEF, and history of CVA.  Previously followed by Dr. Felecia Shelling.  Robert Brown reports feeling well today.  He is asymptomatic and has no acute concerns to discuss aside from desiring to establish care.  He works part-time as an Psychologist, educational.  He endorses former tobacco use, quitting in 2008.  Denies alcohol and illicit drug use.  His family medical history is significant for CAD and a brain tumor.  Chronic medical conditions and outstanding preventative care items discussed today are individually addressed in A/P below.  Outpatient Encounter Medications as of 10/13/2023  Medication Sig   acyclovir (ZOVIRAX) 800 MG tablet SMARTSIG:1 Tablet(s) By Mouth   albuterol (PROVENTIL) (2.5 MG/3ML) 0.083% nebulizer solution Take 3 mLs (2.5 mg total) by nebulization every 6 (six) hours as needed for wheezing or shortness of breath.   albuterol (VENTOLIN HFA) 108 (90 Base) MCG/ACT inhaler Inhale 1-2 puffs into the lungs every 6 (six) hours as needed for shortness of breath or wheezing.   aspirin EC 81 MG tablet Take 1 tablet (81 mg total) by mouth daily. Swallow whole.   Blood Glucose Monitoring Suppl (ACCU-CHEK GUIDE) w/Device KIT Use to check glucose once daily.   Budeson-Glycopyrrol-Formoterol (BREZTRI AEROSPHERE) 160-9-4.8 MCG/ACT AERO Inhale 2 puffs into the lungs in the morning and at bedtime.   budesonide-formoterol (SYMBICORT) 80-4.5 MCG/ACT inhaler Take 2 puffs first thing in am and then another 2 puffs about 12 hours later.   busPIRone (BUSPAR) 5 MG tablet Take by mouth.   carvedilol (COREG) 25 MG tablet Take 25 mg  by mouth 2 (two) times daily.   ezetimibe (ZETIA) 10 MG tablet Take 1 tablet (10 mg total) by mouth daily.   finasteride (PROSCAR) 5 MG tablet Take 5 mg by mouth daily.   furosemide (LASIX) 20 MG tablet Take 20 mg by mouth daily.   gabapentin (NEURONTIN) 300 MG capsule Take 300 mg by mouth 3 (three) times daily as needed.   isosorbide mononitrate (IMDUR) 120 MG 24 hr tablet Take by mouth.   levothyroxine (SYNTHROID) 88 MCG tablet Take 1 tablet (88 mcg total) by mouth every morning.   lidocaine (LIDODERM) 5 % Place 1 patch onto the skin daily. Remove & Discard patch within 12 hours or as directed by MD   losartan (COZAAR) 100 MG tablet Take 100 mg by mouth daily.   metFORMIN (GLUCOPHAGE) 500 MG tablet Take 1 tablet (500 mg total) by mouth 2 (two) times daily.   nitroGLYCERIN (NITROSTAT) 0.4 MG SL tablet Place 1 tablet (0.4 mg total) under the tongue every 5 (five) minutes as needed for chest pain. If a single episode of chest pain is not relieved by one tablet, the patient will try another within 5 minutes; and if this doesn't relieve the pain, the patient is instructed to call 911 for transportation to an emergency department.   potassium chloride (KLOR-CON M) 10 MEQ tablet Take 10 mEq by mouth daily.   rosuvastatin (CRESTOR) 20 MG tablet Take 20 mg by mouth at bedtime.   tamsulosin (FLOMAX) 0.4 MG CAPS capsule Take 0.4 mg by mouth  2 (two) times daily.   valACYclovir (VALTREX) 1000 MG tablet Take 1,000 mg by mouth every 8 (eight) hours.   Vitamin D, Ergocalciferol, (DRISDOL) 1.25 MG (50000 UNIT) CAPS capsule Take 50,000 Units by mouth once a week.   [DISCONTINUED] diltiazem (CARDIZEM CD) 240 MG 24 hr capsule Take 240 mg by mouth daily.   amoxicillin-clavulanate (AUGMENTIN) 875-125 MG tablet Take 1 tablet by mouth every 12 (twelve) hours. (Patient not taking: Reported on 08/31/2023)   DILT-XR 240 MG 24 hr capsule Take 240 mg by mouth daily. (Patient not taking: Reported on 10/13/2023)    pantoprazole (PROTONIX) 40 MG tablet Take 1 tablet (40 mg total) by mouth 2 (two) times daily.   [DISCONTINUED] glipiZIDE (GLUCOTROL XL) 5 MG 24 hr tablet Take 5 mg by mouth daily. (Patient not taking: Reported on 10/13/2023)   [DISCONTINUED] ondansetron (ZOFRAN-ODT) 8 MG disintegrating tablet Take 8 mg by mouth every 8 (eight) hours as needed. (Patient not taking: Reported on 10/13/2023)   [DISCONTINUED] TRELEGY ELLIPTA 100-62.5-25 MCG/ACT AEPB  (Patient not taking: Reported on 10/13/2023)   No facility-administered encounter medications on file as of 10/13/2023.    Past Medical History:  Diagnosis Date   CAD (coronary artery disease)    Cancer (HCC)    Diabetes mellitus without complication (HCC)    Hypertension    Stroke [facility])     Past Surgical History:  Procedure Laterality Date   bone grafts     TUMOR EXCISION      Family History  Problem Relation Age of Onset   Diabetes Mother    Diabetes Father     Social History   Socioeconomic History   Marital status: Married    Spouse name: Not on file   Number of children: Not on file   Years of education: Not on file   Highest education level: Not on file  Occupational History   Not on file  Tobacco Use   Smoking status: Former    Current packs/day: 0.00    Types: Cigarettes    Quit date: 11/17/2006    Years since quitting: 16.9   Smokeless tobacco: Never  Vaping Use   Vaping status: Never Used  Substance and Sexual Activity   Alcohol use: Not Currently    Comment: former drinker   Drug use: Never   Sexual activity: Not Currently  Other Topics Concern   Not on file  Social History Narrative   Not on file   Social Determinants of Health   Financial Resource Strain: High Risk (02/27/2022)   Received from [facility], [facility]   Overall Financial Resource Strain (CARDIA)    Difficulty of Paying Living Expenses: Hard  Food Insecurity: Food Insecurity Present (02/27/2022)   Received from [facility],  [facility]   Hunger Vital Sign    Worried About Running Out of Food in the Last Year: Sometimes true    Ran Out of Food in the Last Year: Never true  Transportation Needs: Patient Declined (02/27/2022)   Received from [facility], [facility]   [facility] - Transportation    Lack of Transportation (Medical): Patient declined    Lack of Transportation (Non-Medical): Patient declined  Physical Activity: Insufficiently Active (02/27/2022)   Received from [facility], [facility]   Exercise Vital Sign    Days of Exercise per Week: 1 day    Minutes of Exercise per Session: 60 min  Stress: Stress Concern Present (02/27/2022)   Received from [facility], [facility]  [employer] [facility] - Occupational Stress Questionnaire    Feeling of Stress : To some extent  Social Connections: Unknown (03/01/2023)   Received from [facility], [facility]   Social Network    Social Network: Not on file  Intimate Partner Violence: Unknown (03/01/2023)   Received from [facility], [facility]   HITS    Physically Hurt: Not on file    Insult or Talk Down To: Not on file    Threaten Physical Harm: Not on file    Scream or Curse: Not on file   Review of Systems  Constitutional:  Negative for chills and fever.  HENT:  Negative for sore throat.   Eyes:  Positive for blurred vision (L eye).  Respiratory:  Negative for cough and shortness of breath.   Cardiovascular:  Negative for chest pain, palpitations and leg swelling.  Gastrointestinal:  Negative for abdominal pain, blood in stool, constipation, diarrhea, nausea and vomiting.  Genitourinary:  Negative for dysuria and hematuria.  Musculoskeletal:  Negative for myalgias.  Skin:  Negative for itching and rash.  Neurological:  Negative for dizziness and headaches.  Psychiatric/Behavioral:  Negative for depression and suicidal ideas.     Objective    BP 137/65 (BP Location: Right Arm, Patient Position: Sitting,  Cuff Size: Large)   Pulse 82   Ht 5\' 10"  (1.778 m)   Wt 223 lb 6.4 oz (101.3 kg)   SpO2 94%   BMI 32.05 kg/m   Physical Exam Vitals reviewed.  Constitutional:      General: He is not in acute distress.    Appearance: Normal appearance. He is obese. He is not ill-appearing.  HENT:     Head: Normocephalic and atraumatic.     Right Ear: External ear normal.     Left Ear: External ear normal.     Nose: Nose normal. No congestion or rhinorrhea.     Mouth/Throat:     Mouth: Mucous membranes are moist.     Pharynx: Oropharynx is clear.  Eyes:     General: No scleral icterus.    Extraocular Movements: Extraocular movements intact.     Conjunctiva/sclera: Conjunctivae normal.     Pupils: Pupils are equal, round, and reactive to light.  Cardiovascular:     Rate and Rhythm: Normal rate and regular rhythm.     Pulses: Normal pulses.     Heart sounds: Normal heart sounds. No murmur heard. Pulmonary:     Effort: Pulmonary effort is normal.     Breath sounds: Normal breath sounds. No wheezing, rhonchi or rales.  Abdominal:     General: Abdomen is flat. Bowel sounds are normal. There is no distension.     Palpations: Abdomen is soft.     Tenderness: There is no abdominal tenderness.  Musculoskeletal:        General: No swelling or deformity. Normal range of motion.     Cervical back: Normal range of motion.  Skin:    General: Skin is warm and dry.     Capillary Refill: Capillary refill takes less than 2 seconds.  Neurological:     General: No focal deficit present.     Mental Status: He is alert and oriented to person, place, and time.     Motor: No weakness.  Psychiatric:        Mood and Affect: Mood normal.        Behavior: Behavior normal.        Thought Content: Thought content  normal.   Last CBC Lab Results  Component Value Date   WBC 11.4 (H) 06/28/2023   HGB 13.6 06/28/2023   HCT 42.7 06/28/2023   MCV 86.3 06/28/2023   MCH 27.5 06/28/2023   RDW 14.3 06/28/2023    PLT 158 06/28/2023   Last metabolic panel Lab Results  Component Value Date   GLUCOSE 117 (H) 08/24/2023   NA 143 08/24/2023   K 3.8 08/24/2023   CL 101 08/24/2023   CO2 28 08/24/2023   BUN 17 08/24/2023   CREATININE 1.23 08/24/2023   EGFR 62 08/24/2023   CALCIUM 9.5 08/24/2023   PROT 6.9 08/24/2023   ALBUMIN 4.3 08/24/2023   LABGLOB 2.6 08/24/2023   BILITOT 0.2 08/24/2023   ALKPHOS 125 (H) 08/24/2023   AST 21 08/24/2023   ALT 26 08/24/2023   ANIONGAP 9 06/28/2023   Last lipids Lab Results  Component Value Date   LDLCALC 142 08/04/2022   TRIG 163 (A) 08/04/2022   Last hemoglobin A1c Lab Results  Component Value Date   HGBA1C 6.9 (A) 08/31/2023   Last thyroid functions Lab Results  Component Value Date   TSH 3.110 08/24/2023   Last vitamin D Lab Results  Component Value Date   VD25OH 34.0 08/24/2023   Assessment & Plan:   Problem List Items Addressed This Visit       HTN (hypertension) (Chronic)    He is currently prescribed losartan 100 mg daily, Imdur 120 mg daily, diltiazem 240 mg daily, and carvedilol 25 mg twice daily.  No medication changes are indicated today.      CAD (coronary artery disease) (Chronic)    Closely followed by cardiology (Dr. Jenene Slicker).  History of CAD with CTO distal RCA and distal LAD.  Medical management recommended.  He is currently prescribed ASA 81 mg daily, rosuvastatin 20 mg daily, diltiazem, carvedilol, and Imdur.  Denies recent chest pain.  Cardiology follow-up is scheduled for March 2025.      (HFpEF) heart failure with preserved ejection fraction (HCC)    He is currently prescribed Lasix 20 mg daily as needed.  Euvolemic on exam today.  No medication changes are indicated at this time.      COPD GOLD 0    Followed by pulmonology (Dr. Sherene Sires).  Currently prescribed Symbicort.  He is asymptomatic and pulmonary exam is unremarkable.      OSA (obstructive sleep apnea)    He endorses nightly compliance with CPAP.   Previously followed by [facility] but is transitioning to [facility] health sleep medicine clinic.      DM (diabetes mellitus), type 2 (HCC)    Followed by endocrinology.  A1c 6.9 on labs from last month.  He is currently prescribed metformin 500 mg twice daily and Ozempic 1 mg weekly.  No medication changes are indicated today.  Endocrinology follow-up is scheduled for January 2025.      Hypothyroidism    Followed by endocrinology.  Currently prescribed levothyroxine 88 mcg daily.  TSH WNL when last updated      BPH (benign prostatic hyperplasia)    Followed by urology.  Symptoms are adequately controlled with Flomax and Proscar.      HLD (hyperlipidemia) (Chronic)    LDL 142 on the latest available labs from September 2023.  He is currently prescribed rosuvastatin 20 mg daily and Zetia 10 mg daily was recently added.  Repeat lipid panel at follow-up in 3 months.      History of prostate cancer  Diagnosed roughly 5 years ago.  Currently under surveillance.  Followed by urology at [facility] health (Dr. Andrey Campanile).      History of CVA (cerebrovascular accident)    He endorses a history of CVA.  No residual deficits appreciated on exam today.  Currently on ASA and statin therapy.      Need for influenza vaccination    Influenza vaccine administered today      Return in about 3 months (around 01/13/2024).   Billie Lade, MD

## 2023-10-13 NOTE — Assessment & Plan Note (Signed)
Followed by endocrinology.  A1c 6.9 on labs from last month.  He is currently prescribed metformin 500 mg twice daily and Ozempic 1 mg weekly.  No medication changes are indicated today.  Endocrinology follow-up is scheduled for January 2025.

## 2023-10-13 NOTE — Assessment & Plan Note (Signed)
Diagnosed roughly 5 years ago.  Currently under surveillance.  Followed by urology at [facility] health (Dr. Andrey Campanile).

## 2023-10-13 NOTE — Assessment & Plan Note (Signed)
Influenza vaccine administered today.

## 2023-10-13 NOTE — Assessment & Plan Note (Signed)
Followed by pulmonology (Dr. Sherene Sires).  Currently prescribed Symbicort.  He is asymptomatic and pulmonary exam is unremarkable.

## 2023-10-13 NOTE — Assessment & Plan Note (Signed)
Closely followed by cardiology (Dr. Jenene Slicker).  History of CAD with CTO distal RCA and distal LAD.  Medical management recommended.  He is currently prescribed ASA 81 mg daily, rosuvastatin 20 mg daily, diltiazem, carvedilol, and Imdur.  Denies recent chest pain.  Cardiology follow-up is scheduled for March 2025.

## 2023-10-13 NOTE — Assessment & Plan Note (Signed)
He is currently prescribed losartan 100 mg daily, Imdur 120 mg daily, diltiazem 240 mg daily, and carvedilol 25 mg twice daily.  No medication changes are indicated today.

## 2023-10-28 NOTE — Progress Notes (Unsigned)
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   Discharge Diagnoses: Principal Problem:   Acute respiratory failure with hypoxia (HCC)   HTN (hypertension)   HLD (hyperlipidemia)   DM (diabetes mellitus), type 2 (HCC)   CAD (coronary artery disease)   Hypothyroidism   Prostate cancer (HCC)   COPD (chronic obstructive pulmonary disease) (HCC)   CAP (community acquired pneumonia)     Brief admission narrative:     72 y.o. male, with a history of coronary artery disease, hypertension, diabetes, COPD, no oxygen requirement at baseline, hyperlipidemia, CAD, prostate cancer, hypothyroidism,  November, report he never felt back to baseline after his COVID infection, patient presents to ED secondary to complaints of shortness of breath, cough and chest tightness, as well reports cough is productive with orange-colored sputum, he does reports chest pain worsening with dyspnea, denies any fever, chills, reports feeling fatigue.  Patient went to urgent care, as was found to be hypoxic, was started on 4 L oxygen and was sent to ED for further evaluation -in ED given his chest pain CTA was obtained, no PE, but significant for pneumonia, EKG nonacute, first troponin is negative, he is on 4 L nasal cannula upon my evaluation, dips to 86% once put on room air, nevic and wheezing which has improved with steroids and nebulizer treatment,  given his hypoxia, pneumonia, [facility] consulted to admit.   Assessment and Plan: * Acute respiratory failure with hypoxia (HCC)- (present on admission) -In the setting of community-acquired pneumonia and COPD exacerbation. -Continue treatment with steroids tapering, mucolytic's and bronchodilator management. -At discharge patient not requiring oxygen supplementation. -Continue the use of flutter valve and incentive spirometer has been requested. -Recommending outpatient follow-up with pulmonologist for PFTs and further adjustment on maintenance therapy for COPD.     CAP (community acquired pneumonia) -As mentioned above patient presenting community-acquired pneumonia -Continue antibiotic management -Patient is afebrile and not requiring oxygen supplementation. -Elevation in his WBCs in the presence of his steroid usage. -Repeat chest x-ray in 6-8 weeks to assure complete resolution of infiltrates.   COPD (chronic obstructive pulmonary disease) (HCC)- (present on admission) -With acute exacerbation as mentioned above. -Continue treatment with steroids (tapering dose), mucolytic's and bronchodilator management. -Currently off oxygen and not requiring supplementation. -Encourage to use flutter valve and incentive spirometer as instructed. -Patient instructed to arrange outpatient follow-up with pulmonologist for PFTs and further adjustment to maintenance therapy. -Discharged on as needed albuterol, Breo Ellipta for maintenance and PPI therapy.   Prostate cancer (HCC)- (present on admission) -History of prostate cancer and BPH -Continue treatment with Flomax and Proscar. -Reports no complaints of urinary retention currently. -Continue patient follow-up with urology service.   Hypothyroidism- (present on admission) -Continue Synthroid. -Continue to follow thyroid panel intermittently as an outpatient.   CAD (coronary artery disease)- (present on admission) -No complaining of  chest pain -Continue treatment with  Coreg, losartan, aspirin, Imdur and statin. -Continue risk factor modifications and outpatient follow-up with cardiology service.     DM (diabetes mellitus), type 2 (HCC) -Patient receiving sliding scale insulin and long-acting basal insulin while inpatient. -CBGs overall well controlled. -Resume home hypoglycemic regimen and follow modified, hydrate diet. -Some elevation on his CBGs will be anticipated while receiving steroid therapy. -Continue to follow blood glucose levels/A1c with further adjustment to hypoglycemic regimen as required.   -A1c 7.0 demonstrating good diabetic control.   HLD (hyperlipidemia)- (present on admission) -Continue statins -Heart healthy diet discussed with patient. -Continue to follow LFTs and lipid panel as an outpatient.   HTN (hypertension)- (present on admission) -Stable overall -Continue current antihypertensive regimen -Continue heart healthy diet. -Reassess blood pressure at follow-up visit with further adjustment of antihypertensive treatment as needed.   Class I obesity -Body mass index is 34.44 kg/m. -Low calorie diet, portion control and increase physical activity discussed with patient.     History of Present Illness  02/03/2022  Pulmonary/ 1st office eval/ Chanz Cahall / [place] [facility]  Chief Complaint  Patient presents with   Hospitalization Follow-up    Patient hospitalized at [facility] from 2/3-2/6 for pneumonia and resp failure with hypoxia  Patient states his breathing has worsened since his hosp stay Has Breo 200not using as prescribed   Baseline best day = June 2022 no meds/no 02/ still working Management consultant on IT projects but has gained 60 lbs since covid  Limited by cp from walking to MB x 50 ft  Dyspnea:  still walking to MB but it's gradually more difficult since d/c with additional concern doe and not checking sats   Cough: none  Sleep: cpap sleeps flat 2 pillow SABA use: once in last few  day, 3 h prior to OV  and not using breo correctly  Rec Stop BREO and start Trelegy one click each am  Only use your albuterol as a rescue medication Ok to try albuterol 15 min (try one puff then next day two) before an activity (on alternating days)  that you know would usually make you short of breath   We will be referring you to the [facility] doctors at [facility] for stable angina > to ER if worsens    05/05/2022  f/u ov/[place] office/Ja Ohman re: presumed copd maint on trelegy  "when he can get it" but out at present Chief Complaint  Patient presents with   Follow-up    Had UC visit on 04/28/22 for COPD exacerbation. Still not feeling well.    Dyspnea:  baseline MB  is 50 ft up hill back s stopping and no cp now  Cough: better  Sleeping: Cpap / sleeps ok but mask doesn't fit/ f/u [facility] sleep doc SABA use: 2 puffs this am  and sev times  a day but no neb  02: none  Rec Please contact your sleep doctor to get better mask and see if cpap needs to be adjusting before adding 02  Plan A = Automatic = Always=    restart Trelegy 100 one click each am  Plan B = Backup (to supplement plan A, not to replace it) Only use your albuterol inhaler as a rescue medication Plan C = Crisis (instead of Plan B but only if Plan B stops working) - only use your albuterol nebulizer if you first try Plan B and it fails to help Keep your cardiology appt   Let me know if you want to be referred to our  sleep medicine specialist   Please schedule a follow up visit in 3 months but call sooner if needed  with  PFTS and all medications /inhalers/ solutions in hand     07/29/2022  f/u ov/[place] office/Jarrell Armond re: AB maint on trelegy  / feels def helped doe  Chief Complaint  Patient presents with   Follow-up    He is doing better since last OV.   Dyspnea:  more out side activity/ mb better but now more limited by leg pains/ numbness esp L lateral s back pain or bowel /bladder issues  Cough: none  Sleeping: cpap  doing fine  SABA use: once qod  02: none  Covid status: vax all  Rec No change medications Work on inhaler technique:  Work on wt loss  Have your doctor refer you to an orthopedic doctor ASAP       07/30/2023 12 m  f/u ov/[place] office/Jeno Calleros re: AB maint on trelegy  / ? Irritating mouth and throat  Chief Complaint  Patient presents with   Follow-up    1 year follow up   Dyspnea:  mb and back 100 ft with down hill s stopping  Cough: some nasal congestions since on cpap  Sleeping: on cpap bed  is flat/ 2 pillows s    resp cc  SABA use: rarely  02: none  Mouth/throat  discomfort ? From trelegy  Rec Plan A = Automatic = Always=    Symbicort 80 Take 2 puffs first thing in am and then another 2 puffs about 12 hours later. (Breztri sample is just to teach the technique)  Work on inhaler technique:  Plan B = Backup (to supplement plan A, not to replace it) Only use your albuterol inhaler as a rescue medication  Plan C = Crisis (instead of Plan B but only if Plan B stops working) - only use your albuterol nebulizer if you first try Plan B  Please schedule a follow up visit in 3 months but call sooner if needed - bring inhalers      10/29/2023  f/u ov/[place] office/Rajanee Schuelke re: *** maint on *** did *** bring inhalers  No chief complaint on file.   Dyspnea:  *** Cough: *** Sleeping: ***   resp cc  SABA use: *** 02: ***  Lung cancer screening: ***   No obvious day to day or daytime variability or assoc excess/ purulent sputum or mucus plugs or hemoptysis or cp or chest tightness, subjective wheeze or overt sinus or hb symptoms.    Also denies any obvious fluctuation of symptoms with weather or environmental changes or other aggravating or alleviating factors except as outlined above   No unusual exposure hx or h/o childhood pna/ asthma or knowledge of premature birth.  Current Allergies, Complete Past Medical History, Past Surgical History, Family History, and Social  History were reviewed in [employer] record.  ROS  The following are not active complaints unless bolded Hoarseness, sore throat, dysphagia, dental problems, itching, sneezing,  nasal congestion or discharge of excess mucus or purulent secretions, ear ache,   fever, chills, sweats, unintended wt loss or wt gain, classically pleuritic or exertional cp,  orthopnea pnd or arm/hand swelling  or leg swelling, presyncope, palpitations, abdominal pain, anorexia, nausea, vomiting, diarrhea  or change in bowel habits or change in bladder habits, change in stools or change in urine, dysuria, hematuria,  rash, arthralgias, visual complaints, headache, numbness, weakness or ataxia or problems with walking or coordination,  change  in mood or  memory.        No outpatient medications have been marked as taking for the 10/29/23 encounter (Appointment) with Nyoka Cowden, MD.             Past Medical History:  Diagnosis Date   CAD (coronary artery disease)    Cancer (HCC)    Diabetes mellitus without complication (HCC)    Hypertension    Stroke [facility])        Objective:    Wts  10/29/2023     ***  07/30/2023       222  07/29/2022       234  05/05/2022       231   04/23/22 237 lb (107.5 kg)  02/03/22 239 lb (108.4 kg)  12/20/21 240 lb (108.9 kg)    Vital signs reviewed  10/29/2023  - Note at rest 02 sats  ***% on ***   General appearance:    ***  Min bar ***         Assessment

## 2023-10-29 ENCOUNTER — Ambulatory Visit: Payer: Medicare HMO | Admitting: Internal Medicine

## 2023-10-29 ENCOUNTER — Encounter: Payer: Self-pay | Admitting: Internal Medicine

## 2023-11-03 ENCOUNTER — Telehealth: Payer: Self-pay | Admitting: Nurse Practitioner

## 2023-11-03 NOTE — Telephone Encounter (Signed)
Called pt to let him know that his Ozempic was delivered to the office. He will pick this up

## 2023-11-05 NOTE — Telephone Encounter (Signed)
Pt picked up pt assistance.

## 2023-11-13 ENCOUNTER — Other Ambulatory Visit: Payer: Self-pay | Admitting: Internal Medicine

## 2023-11-17 ENCOUNTER — Telehealth: Payer: Self-pay | Admitting: *Deleted

## 2023-11-17 NOTE — Telephone Encounter (Signed)
Patient was called and made aware tht he should reach out to the pharmacy. Patient gets this from [facility], a number was provided for them to the patient.

## 2023-11-17 NOTE — Telephone Encounter (Signed)
Yea, I have no idea.  Call the pharmacist to inquire if it is still good.

## 2023-11-17 NOTE — Telephone Encounter (Signed)
 Patient left a telephone message that he went and picked up his Ozempic  last week and his wife placed it in her pocketbook. They forgot about it. He is asking, 1- can he still use it? , 2-  if not, can it be replaced? , 3- He is awaiting a call back with our suggestive course of action.

## 2023-11-20 DIAGNOSIS — E119 Type 2 diabetes mellitus without complications: Secondary | ICD-10-CM | POA: Diagnosis not present

## 2023-11-20 DIAGNOSIS — Z961 Presence of intraocular lens: Secondary | ICD-10-CM | POA: Diagnosis not present

## 2023-11-20 DIAGNOSIS — H04123 Dry eye syndrome of bilateral lacrimal glands: Secondary | ICD-10-CM | POA: Diagnosis not present

## 2023-11-20 DIAGNOSIS — H35373 Puckering of macula, bilateral: Secondary | ICD-10-CM | POA: Diagnosis not present

## 2023-11-20 DIAGNOSIS — H524 Presbyopia: Secondary | ICD-10-CM | POA: Diagnosis not present

## 2023-11-22 NOTE — Progress Notes (Signed)
 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   Discharge Diagnoses: Principal Problem:   Acute respiratory failure with hypoxia (HCC)   HTN (hypertension)   HLD (hyperlipidemia)   DM (diabetes mellitus), type 2 (HCC)   CAD (coronary artery disease)   Hypothyroidism   Prostate cancer (HCC)   COPD (chronic obstructive pulmonary disease) (HCC)   CAP (community acquired pneumonia)     Brief admission narrative:     73 y.o. male, with a history of coronary artery disease, hypertension, diabetes, COPD, no oxygen  requirement at baseline, hyperlipidemia, CAD, prostate cancer, hypothyroidism,  November, report he never felt back to baseline after his COVID infection, patient presents to ED secondary to complaints of shortness of breath, cough and chest tightness, as well reports cough is productive with orange-colored sputum, he does reports chest pain worsening with dyspnea, denies any fever, chills, reports feeling fatigue.  Patient went to urgent care, as was found to be hypoxic, was started on 4 L oxygen  and was sent to ED for further evaluation -in ED given his chest pain CTA was obtained, no PE, but significant for pneumonia, EKG nonacute, first troponin is negative, he is on 4 L nasal cannula upon my evaluation, dips to 86% once put on room air, nevic and wheezing which has improved with steroids and nebulizer treatment,  given his hypoxia, pneumonia, [facility] consulted to admit.   Assessment and Plan: * Acute respiratory failure with hypoxia (HCC)- (present on admission) -In the setting of community-acquired pneumonia and COPD exacerbation. -Continue treatment with steroids tapering, mucolytic's and bronchodilator management. -At discharge patient not requiring oxygen  supplementation. -Continue the use of flutter valve and incentive spirometer has been requested. -Recommending outpatient follow-up with pulmonologist for PFTs and further adjustment on maintenance therapy for COPD.     CAP (community acquired pneumonia) -As mentioned above patient presenting community-acquired pneumonia -Continue antibiotic management -Patient is afebrile and not requiring oxygen  supplementation. -Elevation in his WBCs in the presence of his steroid usage. -Repeat chest x-ray in 6-8 weeks to assure complete resolution of infiltrates.   COPD (chronic obstructive pulmonary disease) (HCC)- (present on admission) -With acute exacerbation as mentioned above. -Continue treatment with steroids (tapering dose), mucolytic's and bronchodilator management. -Currently off oxygen  and not requiring supplementation. -Encourage to use flutter valve and incentive spirometer as instructed. -Patient instructed to arrange outpatient follow-up with pulmonologist for PFTs and further adjustment to maintenance therapy. -Discharged on as needed albuterol , Breo Ellipta  for maintenance and PPI therapy.   Prostate cancer (HCC)- (present on admission) -History of prostate cancer and BPH -Continue treatment with Flomax  and Proscar . -Reports no complaints of urinary retention currently. -Continue patient follow-up with urology service.   Hypothyroidism- (present on admission) -Continue Synthroid . -Continue to follow thyroid  panel intermittently as an outpatient.   CAD (coronary artery disease)- (present on admission) -No complaining of  chest pain -Continue treatment with  Coreg , losartan , aspirin , Imdur  and statin. -Continue risk factor modifications and outpatient follow-up with cardiology service.     DM (diabetes mellitus), type 2 (HCC) -Patient receiving sliding scale insulin  and long-acting basal insulin  while inpatient. -CBGs overall well controlled. -Resume home hypoglycemic regimen and follow modified, hydrate diet. -Some elevation on his CBGs will be anticipated while receiving steroid therapy. -Continue to follow blood glucose levels/A1c with further adjustment to hypoglycemic regimen as required.   -A1c 7.0 demonstrating good diabetic control.   HLD (hyperlipidemia)- (present on admission) -Continue statins -Heart healthy diet discussed with patient. -Continue to follow LFTs and lipid panel as an outpatient.   HTN (hypertension)- (present on admission) -Stable overall -Continue current antihypertensive regimen -Continue heart healthy diet. -Reassess blood pressure at follow-up visit with further adjustment of antihypertensive treatment as needed.   Class I obesity -Body mass index is 34.44 kg/m. -Low calorie diet, portion control and increase physical activity discussed with patient.     History of Present Illness  02/03/2022  Pulmonary/ 1st office eval/ Robert Brown / [place] [facility]  Chief Complaint  Patient presents with   Hospitalization Follow-up    Patient hospitalized at [facility] from 2/3-2/6 for pneumonia and resp failure with hypoxia  Patient states his breathing has worsened since his hosp stay Has Breo 200not using as prescribed   Baseline best day = June 2022 no meds/no 02/ still working management consultant on IT projects but has gained 60 lbs since covid  Limited by cp from walking to MB x 50 ft  Dyspnea:  still walking to MB but it's gradually more difficult since d/c with additional concern doe and not checking sats   Cough: none  Sleep: cpap sleeps flat 2 pillow SABA use: once in last few  day, 3 h prior to OV  and not using breo correctly  Rec Stop BREO and start Trelegy one click each am  Only use your albuterol  as a rescue medication Ok to try albuterol  15 min (try one puff then next day two) before an activity (on alternating days)  that you know would usually make you short of breath   We will be referring you to the heart doctors at [facility] for stable angina > to ER if worsens    05/05/2022  f/u ov/[place] office/Rozelle Caudle re: presumed copd maint on trelegy  when he can get it but out at present Chief Complaint  Patient presents with   Follow-up    Had UC visit on 04/28/22 for COPD exacerbation. Still not feeling well.    Dyspnea:  baseline MB  is 50 ft up hill back s stopping and no cp now  Cough: better  Sleeping: Cpap / sleeps ok but mask doesn't fit/ f/u [facility] sleep doc SABA use: 2 puffs this am  and sev times  a day but no neb  02: none  Rec Please contact your sleep doctor to get better mask and see if cpap needs to be adjusting before adding 02  Plan A = Automatic = Always=    restart Trelegy 100 one click each am  Plan B = Backup (to supplement plan A, not to replace it) Only use your albuterol  inhaler as a rescue medication Plan C = Crisis (instead of Plan B but only if Plan B stops working) - only use your albuterol  nebulizer if you first try Plan B and it fails to help Keep your cardiology appt     07/30/2023 12 m  f/u ov/[place] office/Corydon Schweiss re: AB  maint on trelegy  / ? Irritating mouth and throat  Chief Complaint  Patient presents with   Follow-up    1 year follow up   Dyspnea:  mb and back 100 ft with down hill s stopping  Cough: some nasal congestions since on cpap  Sleeping: on cpap bed  is flat/ 2 pillows s    resp cc  SABA use: rarely  02: none  Mouth/throat  discomfort ? From trelegy  Rec Plan A = Automatic = Always=    Symbicort  80 Take 2 puffs first thing in am and then another 2 puffs about 12 hours later. (Breztri  sample is just to  teach the technique)  Work on inhaler technique:  Plan B = Backup (to supplement plan A, not to replace it) Only use your albuterol  inhaler as a rescue medication  Plan C = Crisis (instead of Plan B but only if Plan B stops working) - only use your albuterol  nebulizer if you first try Plan B  Please schedule a follow up visit in 3 months but call sooner if needed - bring inhalers      11/24/2023  f/u ov/[place] office/Eveleigh Crumpler re: COPD 0/ AB  maint on trelegy  did  bring inhalers  Chief Complaint  Patient presents with   COPD   Shortness of Breath   Dyspnea:  [employer] shopping pushing cart with HCP/ mb and back s stopping  Cough: none  Sleeping: flat bed pillows/ cpap s resp cc  SABA use: rarely  02 :  none   No obvious day to day or daytime variability or assoc excess/ purulent sputum or mucus plugs or hemoptysis or cp or chest tightness, subjective wheeze or overt sinus or hb symptoms.    Also denies any obvious fluctuation of symptoms with weather or environmental changes or other aggravating or alleviating factors except as outlined above   No unusual exposure hx or h/o childhood pna/ asthma or knowledge of premature birth.  Current Allergies, Complete Past Medical History, Past Surgical History, Family History, and Social History were reviewed in [employer] record.  ROS  The following are not active complaints unless bolded Hoarseness, sore throat, dysphagia, dental problems, itching, sneezing,  nasal congestion or discharge of excess mucus or purulent secretions, ear ache,   fever, chills, sweats, unintended wt loss or wt gain, classically pleuritic or exertional cp,  orthopnea pnd or arm/hand swelling  or leg swelling, presyncope, palpitations, abdominal pain, anorexia, nausea, vomiting, diarrhea  or change in bowel habits or change in bladder habits, change in stools or change in urine, dysuria, hematuria,  rash, arthralgias, visual complaints, headache,  numbness, weakness or ataxia or problems with walking or coordination,  change in mood or  memory.        Current Meds  Medication Sig   acyclovir (ZOVIRAX) 800 MG tablet SMARTSIG:1 Tablet(s) By Mouth   albuterol  (PROVENTIL ) (2.5 MG/3ML) 0.083% nebulizer solution Take 3 mLs (2.5 mg total) by nebulization every 6 (six) hours as needed for wheezing or shortness of breath.   albuterol  (VENTOLIN  HFA) 108 (90 Base) MCG/ACT inhaler Inhale 1-2 puffs into the lungs every 6 (six) hours as needed for shortness of breath or wheezing.   aspirin  EC 81 MG tablet Take 1 tablet (81 mg total) by mouth daily. Swallow whole.   Blood Glucose Monitoring Suppl (ACCU-CHEK GUIDE) w/Device KIT Use to check glucose once daily.   busPIRone  (BUSPAR ) 5 MG tablet Take by mouth.   carvedilol  (COREG ) 25  MG tablet Take 25 mg by mouth 2 (two) times daily.   DILT-XR 240 MG 24 hr capsule Take 240 mg by mouth daily.   doxycycline  (MONODOX ) 50 MG capsule Take 50 mg by mouth 2 (two) times daily.   finasteride  (PROSCAR ) 5 MG tablet Take 5 mg by mouth daily.   Fluticasone -Umeclidin-Vilant (TRELEGY ELLIPTA ) 100-62.5-25 MCG/ACT AEPB Inhale into the lungs.   furosemide  (LASIX ) 20 MG tablet TAKE 1 TABLET EVERY DAY   isosorbide  mononitrate (IMDUR ) 120 MG 24 hr tablet Take by mouth.   levothyroxine  (SYNTHROID ) 88 MCG tablet Take 1 tablet (88 mcg total) by mouth every morning.   losartan  (COZAAR ) 100 MG tablet Take 100 mg by mouth daily.   metFORMIN  (GLUCOPHAGE ) 500 MG tablet Take 1 tablet (500 mg total) by mouth 2 (two) times daily.   nitroGLYCERIN  (NITROSTAT ) 0.4 MG SL tablet Place 1 tablet (0.4 mg total) under the tongue every 5 (five) minutes as needed for chest pain. If a single episode of chest pain is not relieved by one tablet, the patient will try another within 5 minutes; and if this doesn't relieve the pain, the patient is instructed to call 911 for transportation to an emergency department.   potassium chloride  (KLOR-CON  M) 10  MEQ tablet Take 10 mEq by mouth daily.   rosuvastatin  (CRESTOR ) 20 MG tablet Take 20 mg by mouth at bedtime.   tamsulosin  (FLOMAX ) 0.4 MG CAPS capsule Take 0.4 mg by mouth 2 (two) times daily.   Vitamin D , Ergocalciferol , (DRISDOL) 1.25 MG (50000 UNIT) CAPS capsule Take 50,000 Units by mouth once a week.             Past Medical History:  Diagnosis Date   CAD (coronary artery disease)    Cancer (HCC)    Diabetes mellitus without complication (HCC)    Hypertension    Stroke (HCC)        Objective:    Wts  11/24/2023         219  07/30/2023       222  07/29/2022       234  05/05/2022       231   04/23/22 237 lb (107.5 kg)  02/03/22 239 lb (108.4 kg)  12/20/21 240 lb (108.9 kg)    Vital signs reviewed  11/24/2023  - Note at rest 02 sats  94% on RA   General appearance:    amb pleasant wm nad    HEENT : Oropharynx  clear      NECK :  without  apparent JVD/ palpable Nodes/TM    LUNGS: no acc muscle use,  Min barrel  contour chest wall with bilateral  slightly decreased bs s audible wheeze and  without cough on insp or exp maneuvers and min  Hyperresonant  to  percussion bilaterally    CV:  RRR  no s3 or murmur or increase in P2, and no edema   ABD:  soft and nontender with pos end  insp Hoover's  in the supine position.  No bruits or organomegaly appreciated   MS:  Nl gait/ ext warm without deformities Or obvious joint restrictions  calf tenderness, cyanosis or clubbing     SKIN: warm and dry without lesions    NEURO:  alert, approp, nl sensorium with  no motor or cerebellar deficits apparent.               Assessment

## 2023-11-24 ENCOUNTER — Ambulatory Visit: Payer: Medicare Other | Admitting: Internal Medicine

## 2023-11-24 ENCOUNTER — Encounter: Payer: Self-pay | Admitting: Internal Medicine

## 2023-11-24 VITALS — BP 171/79 | HR 76 | Ht 70.0 in | Wt 219.0 lb

## 2023-11-24 DIAGNOSIS — M5417 Radiculopathy, lumbosacral region: Secondary | ICD-10-CM | POA: Insufficient documentation

## 2023-11-24 DIAGNOSIS — J449 Chronic obstructive pulmonary disease, unspecified: Secondary | ICD-10-CM | POA: Diagnosis not present

## 2023-11-24 DIAGNOSIS — M48061 Spinal stenosis, lumbar region without neurogenic claudication: Secondary | ICD-10-CM | POA: Insufficient documentation

## 2023-11-24 DIAGNOSIS — N400 Enlarged prostate without lower urinary tract symptoms: Secondary | ICD-10-CM | POA: Insufficient documentation

## 2023-11-24 DIAGNOSIS — H8110 Benign paroxysmal vertigo, unspecified ear: Secondary | ICD-10-CM | POA: Insufficient documentation

## 2023-11-24 DIAGNOSIS — E1165 Type 2 diabetes mellitus with hyperglycemia: Secondary | ICD-10-CM | POA: Insufficient documentation

## 2023-11-24 DIAGNOSIS — R4189 Other symptoms and signs involving cognitive functions and awareness: Secondary | ICD-10-CM | POA: Insufficient documentation

## 2023-11-24 DIAGNOSIS — M25559 Pain in unspecified hip: Secondary | ICD-10-CM | POA: Insufficient documentation

## 2023-11-24 DIAGNOSIS — I119 Hypertensive heart disease without heart failure: Secondary | ICD-10-CM | POA: Insufficient documentation

## 2023-11-24 DIAGNOSIS — H9190 Unspecified hearing loss, unspecified ear: Secondary | ICD-10-CM | POA: Insufficient documentation

## 2023-11-24 DIAGNOSIS — E539 Vitamin B deficiency, unspecified: Secondary | ICD-10-CM | POA: Insufficient documentation

## 2023-11-24 DIAGNOSIS — E291 Testicular hypofunction: Secondary | ICD-10-CM | POA: Insufficient documentation

## 2023-11-24 DIAGNOSIS — K118 Other diseases of salivary glands: Secondary | ICD-10-CM | POA: Insufficient documentation

## 2023-11-24 MED ORDER — TRELEGY ELLIPTA 100-62.5-25 MCG/ACT IN AEPB: INHALATION_SPRAY | RESPIRATORY_TRACT | 3 refills | Status: DC

## 2023-11-25 NOTE — Assessment & Plan Note (Signed)
 Quit smoking 2008 and symptoms started p covid July 2022  -  02/03/2022   Walked on RA  x  3  lap(s) =  approx 450  ft  @ slow pace, stopped due to sob  with lowest 02 sats 91%  - 05/05/2022  After extensive coaching inhaler device,  effectiveness =    75% with DPI > continue trelegy and approp saba  - 05/05/2022   Walked on RA  x  3  lap(s) =  approx 350  ft  @ slow pace, stopped due to end of study s sob or cp  with lowest 02 sats 92%   - PFT's  07/15/22  FEV1 1.66 (51 % ) ratio 0.81  p 0 % improvement from saba p trelegy prior to study with DLCO  15.87 (61%)   and FV curve no significant curvature and ERV 22% at wt 235    - 07/30/2023  After extensive coaching inhaler device,  effectiveness =    75% with upper airway symptoms ? From trelegy > try symbicort  80 2bid prn instead and baking soda toothpaste    Group D (now reclassified as E) in terms of symptom/risk and laba/lama/ICS  therefore appropriate rx at this point >>>  prefers trelegy 100 q am as avaialable for free thru [employer] which is fine with me unless again develops upper airway symptoms from it > strongly advised arm and hammer toothpaste p rx  F/u q 6 m sooner prn          Each maintenance medication was reviewed in detail including emphasizing most importantly the difference between maintenance and prns and under what circumstances the prns are to be triggered using an action plan format where appropriate.  Total time for H and P, chart review, counseling, reviewing dpi/hfa/ neb device(s) and generating customized AVS unique to this office visit / same day charting = 25 min

## 2023-12-01 ENCOUNTER — Telehealth: Payer: Self-pay | Admitting: Nurse Practitioner

## 2023-12-01 NOTE — Telephone Encounter (Signed)
 Mailed Novo Nordisk PAP reenrollment application for 2025

## 2023-12-02 IMAGING — DX DG CHEST 2V
2 series · 2 of 2 positions shown · non-contrast
Comparison: 02/03/2022

CLINICAL DATA: Cough and shortness of breath

EXAM:
CHEST - 2 VIEW

[chest pa]
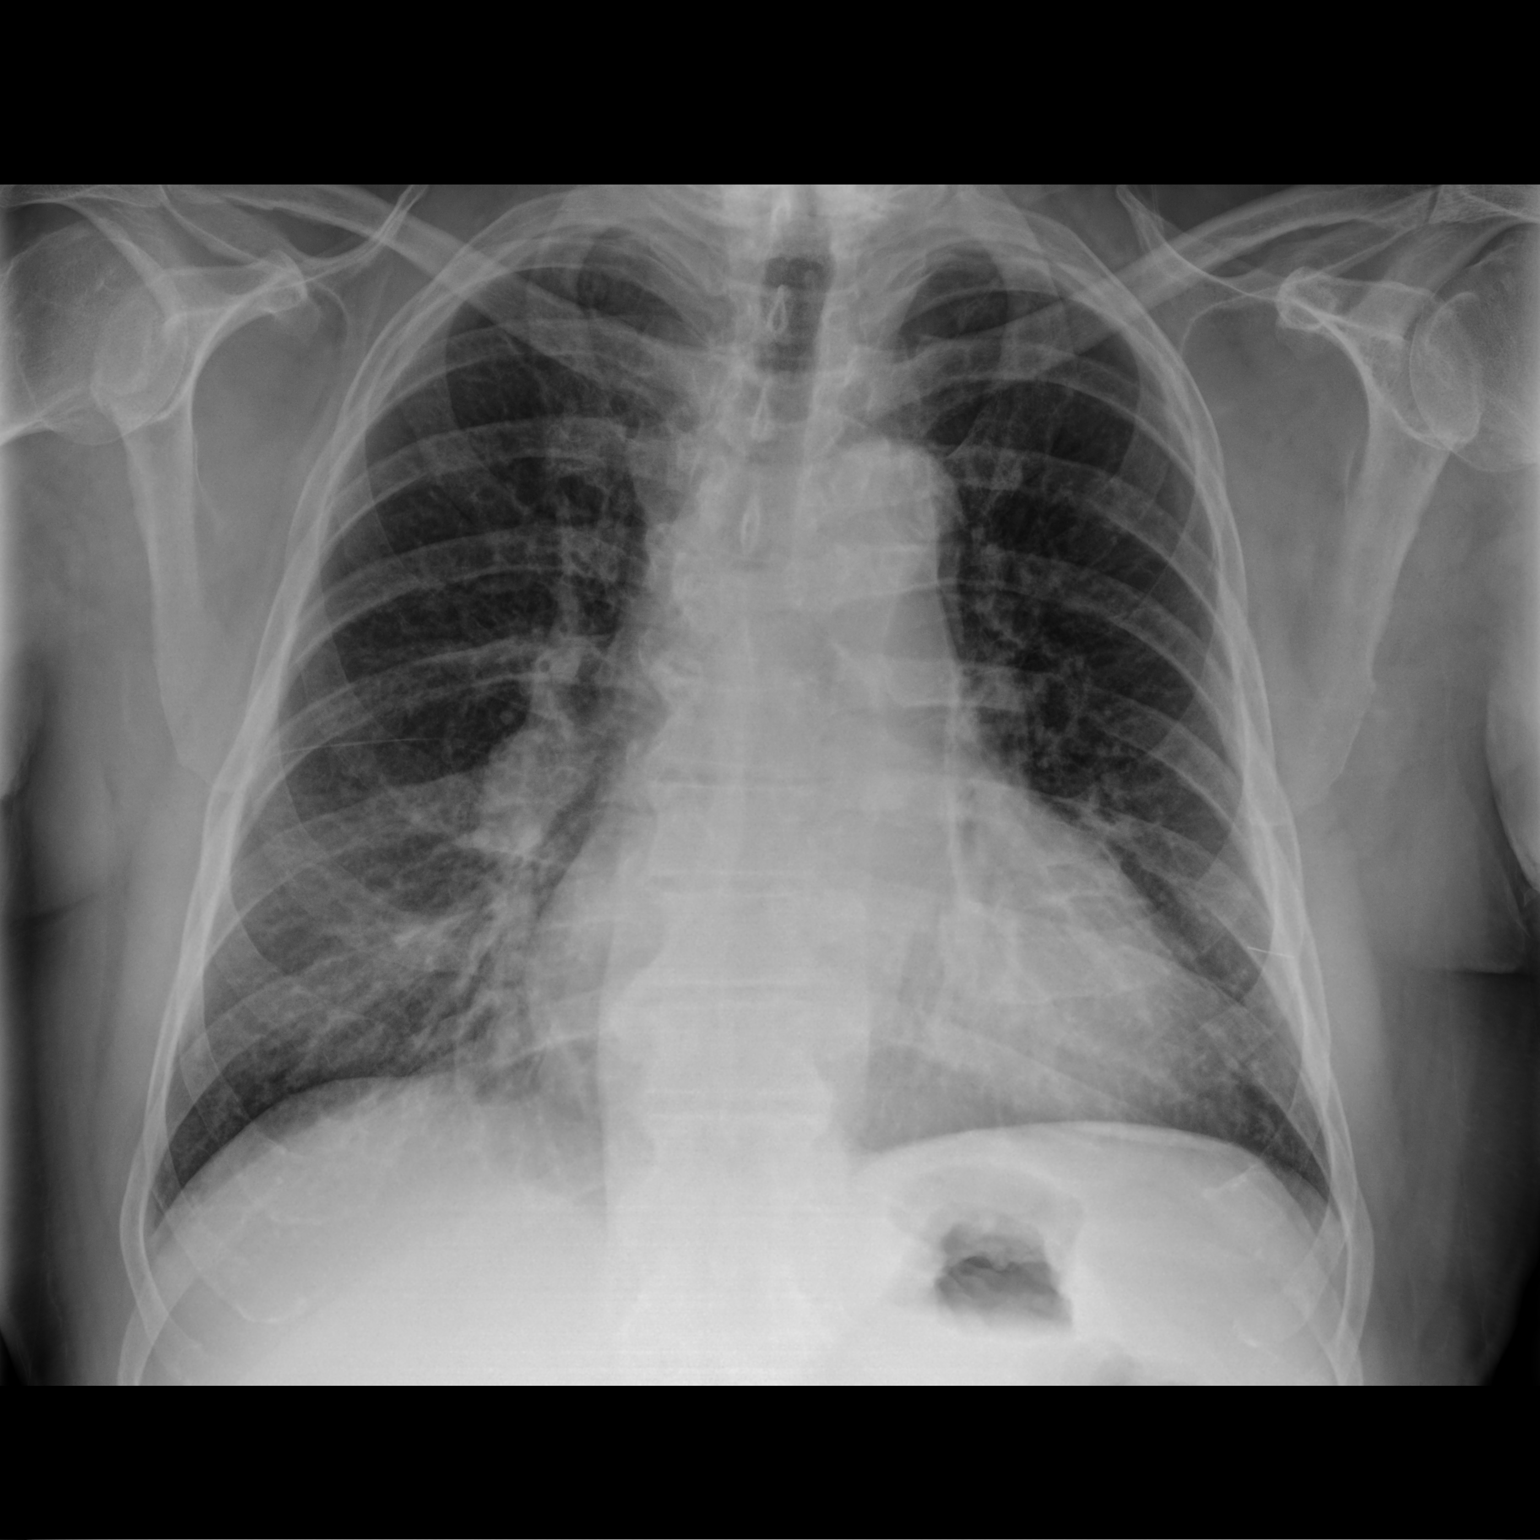

[chest lat]
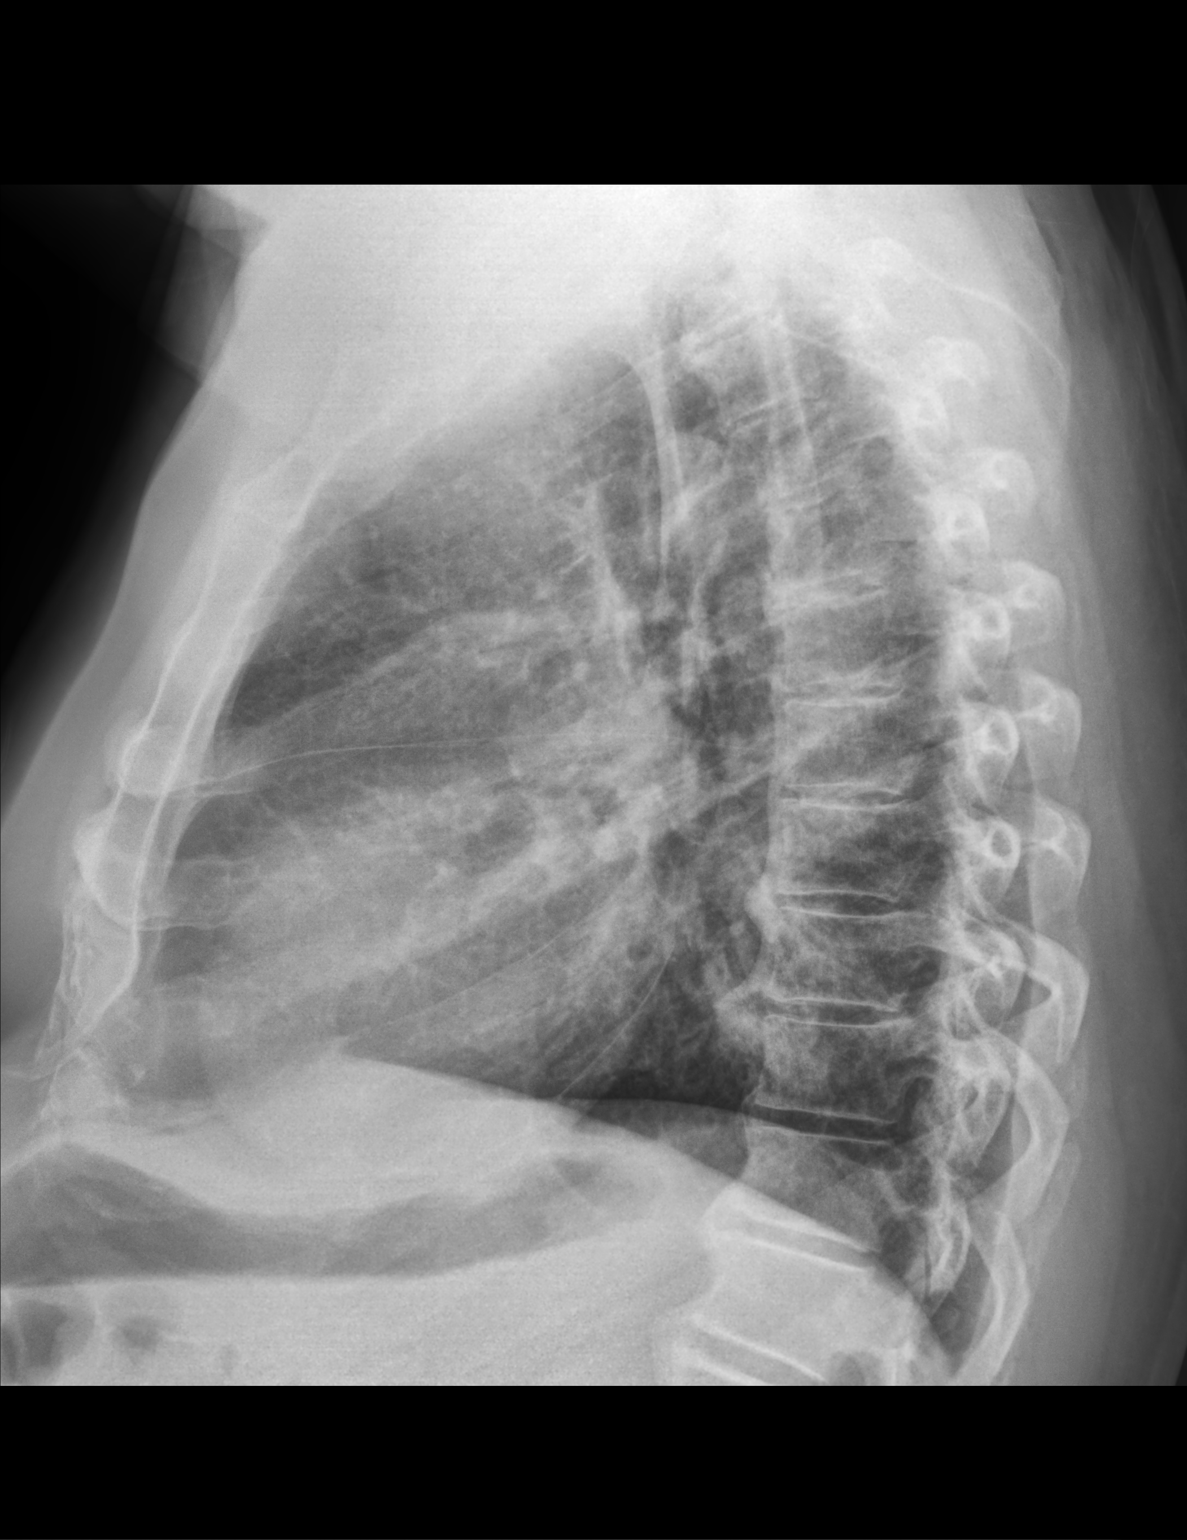

[2 of 2 positions shown; findings below may reference images not displayed]

FINDINGS: Similar cardiomegaly. Minimally increased patchy density at the
right lung base. No pleural effusion. No pneumothorax.
IMPRESSION: Minimal atelectasis/consolidation at the right lung base. Stable
cardiomegaly.

## 2023-12-08 ENCOUNTER — Encounter: Payer: Self-pay | Admitting: Nurse Practitioner

## 2023-12-08 ENCOUNTER — Ambulatory Visit: Payer: Medicare Other | Admitting: Nurse Practitioner

## 2023-12-08 VITALS — BP 140/73 | HR 76 | Ht 70.0 in | Wt 223.0 lb

## 2023-12-08 DIAGNOSIS — Z7984 Long term (current) use of oral hypoglycemic drugs: Secondary | ICD-10-CM

## 2023-12-08 DIAGNOSIS — E039 Hypothyroidism, unspecified: Secondary | ICD-10-CM

## 2023-12-08 DIAGNOSIS — E119 Type 2 diabetes mellitus without complications: Secondary | ICD-10-CM

## 2023-12-08 DIAGNOSIS — Z7985 Long-term (current) use of injectable non-insulin antidiabetic drugs: Secondary | ICD-10-CM | POA: Diagnosis not present

## 2023-12-08 DIAGNOSIS — E559 Vitamin D deficiency, unspecified: Secondary | ICD-10-CM

## 2023-12-08 LAB — POCT GLYCOSYLATED HEMOGLOBIN (HGB A1C): Hemoglobin A1C: 9.4 % — AB (ref 4.0–5.6)

## 2023-12-08 MED ORDER — METFORMIN HCL 500 MG PO TABS: 500.0000 mg | ORAL_TABLET | Freq: Two times a day (BID) | ORAL | 3 refills | Status: DC

## 2023-12-08 MED ORDER — LEVOTHYROXINE SODIUM 88 MCG PO TABS: 88.0000 ug | ORAL_TABLET | Freq: Every morning | ORAL | 1 refills | Status: DC

## 2023-12-08 MED ORDER — FREESTYLE LIBRE 3 PLUS SENSOR MISC: 3 refills | Status: DC

## 2023-12-08 NOTE — Progress Notes (Signed)
Endocrinology Follow Up Note       12/08/2023, 3:50 PM   Subjective:    Patient ID: Robert Brown, male    DOB: [birth year].  Robert Brown is being seen in follow up after being seen in consultation for management of currently uncontrolled symptomatic diabetes requested by  Robert Lade, MD.   Past Medical History:  Diagnosis Date   CAD (coronary artery disease)    Cancer (HCC)    Diabetes mellitus without complication (HCC)    Hypertension    Stroke [facility])     Past Surgical History:  Procedure Laterality Date   bone grafts     TUMOR EXCISION      Social History   Socioeconomic History   Marital status: Married    Spouse name: Not on file   Number of children: Not on file   Years of education: Not on file   Highest education level: Not on file  Occupational History   Not on file  Tobacco Use   Smoking status: Former    Current packs/day: 0.00    Types: Cigarettes    Quit date: 11/17/2006    Years since quitting: 17.0   Smokeless tobacco: Never  Vaping Use   Vaping status: Never Used  Substance and Sexual Activity   Alcohol use: Not Currently    Comment: former drinker   Drug use: Never   Sexual activity: Not Currently  Other Topics Concern   Not on file  Social History Narrative   Not on file   Social Drivers of Health   Financial Resource Strain: High Risk (02/27/2022)   Received from [facility], [facility]   Overall Financial Resource Strain (CARDIA)    Difficulty of Paying Living Expenses: Hard  Food Insecurity: Food Insecurity Present (02/27/2022)   Received from [facility], [facility]   Hunger Vital Sign    Worried About Running Out of Food in the Last Year: Sometimes true    Ran Out of Food in the Last Year: Never true  Transportation Needs: Patient Declined (02/27/2022)   Received from [facility], [facility]   [facility] - Transportation    Lack of  Transportation (Medical): Patient declined    Lack of Transportation (Non-Medical): Patient declined  Physical Activity: Insufficiently Active (02/27/2022)   Received from [facility], [facility]   Exercise Vital Sign    Days of Exercise per Week: 1 day    Minutes of Exercise per Session: 60 min  Stress: Stress Concern Present (02/27/2022)   Received from [facility], [facility]   [employer] [facility] - Occupational Stress Questionnaire    Feeling of Stress : To some extent  Social Connections: Unknown (03/01/2023)   Received from [facility], [facility]   Social Network    Social Network: Not on file    Family History  Problem Relation Age of Onset   Diabetes Mother    Diabetes Father     Outpatient Encounter Medications as of 12/08/2023  Medication Sig   acyclovir (ZOVIRAX) 800 MG tablet SMARTSIG:1 Tablet(s) By Mouth   albuterol (PROVENTIL) (2.5 MG/3ML) 0.083% nebulizer solution Take 3 mLs (2.5 mg total) by nebulization every  6 (six) hours as needed for wheezing or shortness of breath.   albuterol (VENTOLIN HFA) 108 (90 Base) MCG/ACT inhaler Inhale 1-2 puffs into the lungs every 6 (six) hours as needed for shortness of breath or wheezing.   aspirin EC 81 MG tablet Take 1 tablet (81 mg total) by mouth daily. Swallow whole.   Blood Glucose Monitoring Suppl (ACCU-CHEK GUIDE) w/Device KIT Use to check glucose once daily.   busPIRone (BUSPAR) 5 MG tablet Take by mouth.   carvedilol (COREG) 25 MG tablet Take 25 mg by mouth 2 (two) times daily.   Continuous Glucose Sensor (FREESTYLE LIBRE 3 PLUS SENSOR) MISC Change sensor every 15 days.   DILT-XR 240 MG 24 hr capsule Take 240 mg by mouth daily.   doxycycline (MONODOX) 50 MG capsule Take 50 mg by mouth 2 (two) times daily.   ezetimibe (ZETIA) 10 MG tablet Take 1 tablet (10 mg total) by mouth daily.   finasteride (PROSCAR) 5 MG tablet Take 5 mg by mouth daily.   Fluticasone-Umeclidin-Vilant (TRELEGY  ELLIPTA) 100-62.5-25 MCG/ACT AEPB One click each am   furosemide (LASIX) 20 MG tablet TAKE 1 TABLET EVERY DAY   isosorbide mononitrate (IMDUR) 120 MG 24 hr tablet Take by mouth.   losartan (COZAAR) 100 MG tablet Take 100 mg by mouth daily.   nitroGLYCERIN (NITROSTAT) 0.4 MG SL tablet Place 1 tablet (0.4 mg total) under the tongue every 5 (five) minutes as needed for chest pain. If a single episode of chest pain is not relieved by one tablet, the patient will try another within 5 minutes; and if this doesn't relieve the pain, the patient is instructed to call 911 for transportation to an emergency department.   potassium chloride (KLOR-CON M) 10 MEQ tablet Take 10 mEq by mouth daily.   rosuvastatin (CRESTOR) 20 MG tablet Take 20 mg by mouth at bedtime.   tamsulosin (FLOMAX) 0.4 MG CAPS capsule Take 0.4 mg by mouth 2 (two) times daily.   Vitamin D, Ergocalciferol, (DRISDOL) 1.25 MG (50000 UNIT) CAPS capsule Take 50,000 Units by mouth once a week.   [DISCONTINUED] levothyroxine (SYNTHROID) 88 MCG tablet Take 1 tablet (88 mcg total) by mouth every morning.   [DISCONTINUED] metFORMIN (GLUCOPHAGE) 500 MG tablet Take 1 tablet (500 mg total) by mouth 2 (two) times daily.   levothyroxine (SYNTHROID) 88 MCG tablet Take 1 tablet (88 mcg total) by mouth every morning.   metFORMIN (GLUCOPHAGE) 500 MG tablet Take 1 tablet (500 mg total) by mouth 2 (two) times daily.   valACYclovir (VALTREX) 1000 MG tablet Take 1,000 mg by mouth every 8 (eight) hours. (Patient not taking: Reported on 11/24/2023)   No facility-administered encounter medications on file as of 12/08/2023.    ALLERGIES: Allergies  Allergen Reactions   Alcohol Other (See Comments)   Lisinopril     Medicines ending in -pril   Statins     VACCINATION STATUS: Immunization History  Administered Date(s) Administered   Fluad Quad(high Dose 65+) 09/10/2020   Fluad Trivalent(High Dose 65+) 10/13/2023   Influenza, High Dose Seasonal PF 09/06/2018,  10/11/2019, 08/28/2021   PFIZER Comirnaty(Gray Top)Covid-19 Tri-Sucrose Vaccine 03/12/2020, 04/06/2020, 10/22/2020, 04/01/2021   PFIZER(Purple Top)SARS-COV-2 Vaccination 03/12/2020, 04/06/2020, 10/22/2020   Pfizer Covid-19 Vaccine Bivalent Booster 13yrs & up 09/04/2021   Pneumococcal Conjugate-13 06/18/2015   Pneumococcal Polysaccharide-23 06/23/2017, 06/23/2017   Tdap 02/22/2020    Diabetes He presents for his follow-up diabetic visit. He has type 2 diabetes mellitus. His disease course has been stable. There are  no hypoglycemic associated symptoms. There are no diabetic associated symptoms. There are no hypoglycemic complications. Symptoms are stable. Diabetic complications include a CVA, heart disease (CAD and CHF), nephropathy and peripheral neuropathy. Risk factors for coronary artery disease include diabetes mellitus, dyslipidemia, family history, obesity, male sex, hypertension and sedentary lifestyle. Current diabetic treatment includes oral agent (monotherapy) (and Ozempic). He is compliant with treatment most of the time. His weight is fluctuating minimally. He is following a generally healthy diet. When asked about meal planning, he reported none. He has not had a previous visit with a dietitian. He rarely participates in exercise. His home blood glucose trend is fluctuating minimally. His overall blood glucose range is 130-140 mg/dl. (He presents today with his CGM showing inconsistent monitoring and mostly at goal glycemic profile overall.  His POCT A1c today is 7.1%, increasing from last visit of 6.9%.  He denies any significant hypoglycemia.   Analysis of his CGM shows TIR 83%, TAR 17%, TBR 0% and active time 28%. ) An ACE inhibitor/angiotensin II receptor blocker is being taken. He does not see a podiatrist.Eye exam is current.     Review of systems  Constitutional: + increasing body weight, current Body mass index is 32 kg/m., no fatigue, no subjective hyperthermia, no subjective  hypothermia Eyes: no blurry vision, no xerophthalmia ENT: no sore throat, no nodules palpated in throat, no dysphagia/odynophagia, no hoarseness Cardiovascular: no chest pain, no shortness of breath, no palpitations, no leg swelling Respiratory: no cough, no shortness of breath Gastrointestinal: no nausea/vomiting/diarrhea Musculoskeletal: no muscle/joint aches Skin: no rashes, no hyperemia Neurological: no tremors, no numbness, no tingling, no dizziness Psychiatric: no depression, no anxiety  Objective:     BP (!) 140/73 (BP Location: Right Arm, Patient Position: Sitting, Cuff Size: Large)   Pulse 76   Ht 5\' 10"  (1.778 m)   Wt 223 lb (101.2 kg)   BMI 32.00 kg/m   Wt Readings from Last 3 Encounters:  12/08/23 223 lb (101.2 kg)  11/24/23 219 lb (99.3 kg)  10/13/23 223 lb 6.4 oz (101.3 kg)     BP Readings from Last 3 Encounters:  12/08/23 (!) 140/73  11/24/23 (!) 171/79  10/13/23 137/65       Physical Exam- Limited  Constitutional:  Body mass index is 32 kg/m. , not in acute distress, normal state of mind Eyes:  EOMI, no exophthalmos Musculoskeletal: no gross deformities, strength intact in all four extremities, no gross restriction of joint movements Skin:  no rashes, no hyperemia Neurological: no tremor with outstretched hands   Diabetic Foot Exam - Simple   No data filed      CMP ( most recent) CMP     Component Value Date/Time   NA 143 08/24/2023 1437   K 3.8 08/24/2023 1437   CL 101 08/24/2023 1437   CO2 28 08/24/2023 1437   GLUCOSE 117 (H) 08/24/2023 1437   GLUCOSE 122 (H) 06/28/2023 1815   BUN 17 08/24/2023 1437   CREATININE 1.23 08/24/2023 1437   CALCIUM 9.5 08/24/2023 1437   PROT 6.9 08/24/2023 1437   ALBUMIN 4.3 08/24/2023 1437   AST 21 08/24/2023 1437   ALT 26 08/24/2023 1437   ALKPHOS 125 (H) 08/24/2023 1437   BILITOT 0.2 08/24/2023 1437   GFRNONAA >60 06/28/2023 1815   GFRAA >60 06/21/2020 1510     Diabetic Labs (most recent): Lab  Results  Component Value Date   HGBA1C 9.4 (A) 12/08/2023   HGBA1C 6.9 (A) 08/31/2023   HGBA1C  6.8 (A) 05/29/2023   MICROALBUR 30mg /L 08/31/2023     Lipid Panel ( most recent) Lipid Panel     Component Value Date/Time   TRIG 163 (A) 08/04/2022 0000   LDLCALC 142 08/04/2022 0000      Lab Results  Component Value Date   TSH 3.110 08/24/2023   TSH 3.96 08/04/2022   FREET4 1.33 08/24/2023           Assessment & Plan:   1) Type 2 Diabetes mellitus without complication without long-term current use of insulin  He presents today with his CGM showing inconsistent monitoring and mostly at goal glycemic profile overall.  His POCT A1c today is 7.1%, increasing from last visit of 6.9%.  He denies any significant hypoglycemia.   Analysis of his CGM shows TIR 83%, TAR 17%, TBR 0% and active time 28%.   - Chandra Tromp has currently uncontrolled symptomatic type 2 DM since 73 years of age.   -Recent labs reviewed.  - I had a long discussion with him about the progressive nature of diabetes and the pathology behind its complications. -his diabetes is complicated by CAD with MI, CVA, CKD stage 2, neuropathy and he remains at a high risk for more acute and chronic complications which include CAD, CVA, CKD, retinopathy, and neuropathy. These are all discussed in detail with him.  The following Lifestyle Medicine recommendations according to American College of Lifestyle Medicine [facility]) were discussed and offered to patient and he agrees to start the journey:  A. Whole Foods, Plant-based plate comprising of fruits and vegetables, plant-based proteins, whole-grain carbohydrates was discussed in detail with the patient.   A list for source of those nutrients were also provided to the patient.  Patient will use only water or unsweetened tea for hydration. B.  The need to stay away from risky substances including alcohol, smoking; obtaining 7 to 9 hours of restorative sleep, at least 150 minutes of  moderate intensity exercise weekly, the importance of healthy social connections,  and stress reduction techniques were discussed. C.  A full color page of  Calorie density of various food groups per pound showing examples of each food groups was provided to the patient.  - Nutritional counseling repeated at each appointment due to patients tendency to fall back in to old habits.  - The patient admits there is a room for improvement in their diet and drink choices. -  Suggestion is made for the patient to avoid simple carbohydrates from their diet including Cakes, Sweet Desserts / Pastries, Ice Cream, Soda (diet and regular), Sweet Tea, Candies, Chips, Cookies, Sweet Pastries, Store Bought Juices, Alcohol in Excess of 1-2 drinks a day, Artificial Sweeteners, Coffee Creamer, and "Sugar-free" Products. This will help patient to have stable blood glucose profile and potentially avoid unintended weight gain.   - I encouraged the patient to switch to unprocessed or minimally processed complex starch and increased protein intake (animal or plant source), fruits, and vegetables.   - Patient is advised to stick to a routine mealtimes to eat 3 meals a day and avoid unnecessary snacks (to snack only to correct hypoglycemia).  - I have approached him with the following individualized plan to manage his diabetes and patient agrees:   -He is advised to increase Ozempic to 1 mg SQ weekly (he was using up his previous 0.5 mg supply).  He is advised to continue Metformin 500 mg po twice daily with meals.   He could most certainly benefit from GLP1 product  given his extensive CAD history with MI and CVA in the past.  -he is encouraged to continue monitoring glucose once daily daily, before breakfast, and to call the clinic if he had readings less than 70 or above 300 for 3 tests in a row.  I will send in for upgrade to [facility] 3 for him, Josephine Igo 2 products will be discontinued come September.  - he is warned not to  take insulin without proper monitoring per orders. - Adjustment parameters are given to him for hypo and hyperglycemia in writing.  - he is not a candidate for full dose Metformin due to concurrent renal insufficiency.  - Specific targets for  A1c; LDL, HDL, and Triglycerides were discussed with the patient.  2) Blood Pressure /Hypertension:  his blood pressure is controlled to target.   he is advised to continue his current medications including Losartan 100 mg po daily, Lasix 20 mg po daily,  Imdur 120 mg p.o. daily with breakfast, Coreg 25 mg po twice daily, and Diltiazem 240 mg po daily.  3) Lipids/Hyperlipidemia:    Review of his recent lipid panel from 01/28/23 showed uncontrolled LDL at 126 .  he is advised to continue Crestor 20 mg daily at bedtime.  Side effects and precautions discussed with him.  Will recheck prior to next visit.  4)  Weight/Diet:  his Body mass index is 32 kg/m.  -  clearly complicating his diabetes care.   he is a candidate for weight loss. I discussed with him the fact that loss of 5 - 10% of his  current body weight will have the most impact on his diabetes management.  Exercise, and detailed carbohydrates information provided  -  detailed on discharge instructions.  5) Hypothyroidism-unspecified The details surrounding his diagnosis are not available.  He denies any known family history of thyroid problems.   There are no recent TFTs to review.  He is advised to continue Levothyroxine 88 mcg po daily before breakfast.  Will recheck TFTs prior to next visit and adjust dose accordingly.   - The correct intake of thyroid hormone (Levothyroxine, Synthroid), is on empty stomach first thing in the morning, with water, separated by at least 30 minutes from breakfast and other medications,  and separated by more than 4 hours from calcium, iron, multivitamins, acid reflux medications (PPIs).  - This medication is a life-long medication and will be needed to correct  thyroid hormone imbalances for the rest of your life.  The dose may change from time to time, based on thyroid blood work.  - It is extremely important to be consistent taking this medication, near the same time each morning.  -AVOID TAKING PRODUCTS CONTAINING BIOTIN (commonly found in Hair, Skin, Nails vitamins) AS IT INTERFERES WITH THE VALIDITY OF THYROID FUNCTION BLOOD TESTS.  6) Chronic Care/Health Maintenance: -he is on ACEI/ARB and Statin medications and is encouraged to initiate and continue to follow up with Ophthalmology, Dentist, Podiatrist at least yearly or according to recommendations, and advised to stay away from smoking. I have recommended yearly flu vaccine and pneumonia vaccine at least every 5 years; moderate intensity exercise for up to 150 minutes weekly; and sleep for at least 7 hours a day.  - he is advised to maintain close follow up with Durwin Nora, Lucina Mellow, MD for primary care needs, as well as his other providers for optimal and coordinated care.     I spent  46  minutes in the care of the patient today  including review of labs from CMP, Lipids, Thyroid Function, Hematology (current and previous including abstractions from other facilities); face-to-face time discussing  his blood glucose readings/logs, discussing hypoglycemia and hyperglycemia episodes and symptoms, medications doses, his options of short and long term treatment based on the latest standards of care / guidelines;  discussion about incorporating lifestyle medicine;  and documenting the encounter. Risk reduction counseling performed per USPSTF guidelines to reduce obesity and cardiovascular risk factors.     Please refer to Patient Instructions for Blood Glucose Monitoring and Insulin/Medications Dosing Guide"  in media tab for additional information. Please  also refer to " Patient Self Inventory" in the Media  tab for reviewed elements of pertinent patient history.  Georgetta Haber participated in the  discussions, expressed understanding, and voiced agreement with the above plans.  All questions were answered to his satisfaction. he is encouraged to contact clinic should he have any questions or concerns prior to his return visit.     Follow up plan: - Return in about 4 months (around 04/06/2024) for Diabetes F/U with A1c in office, Bring meter and logs.   Ronny Bacon, [facility] [address] [place], [place] [address] Phone: [phone] Fax: [phone]  12/08/2023, 3:50 PM

## 2023-12-18 DIAGNOSIS — G4733 Obstructive sleep apnea (adult) (pediatric): Secondary | ICD-10-CM | POA: Diagnosis not present

## 2024-01-07 ENCOUNTER — Institutional Professional Consult (permissible substitution): Payer: Medicare Other | Admitting: Adult Health

## 2024-01-13 ENCOUNTER — Ambulatory Visit: Payer: Medicare HMO | Admitting: Internal Medicine

## 2024-01-21 ENCOUNTER — Encounter: Payer: Self-pay | Admitting: Internal Medicine

## 2024-01-21 ENCOUNTER — Ambulatory Visit (INDEPENDENT_AMBULATORY_CARE_PROVIDER_SITE_OTHER): Payer: Medicare Other | Admitting: Internal Medicine

## 2024-01-21 ENCOUNTER — Telehealth: Payer: Self-pay | Admitting: Nurse Practitioner

## 2024-01-21 VITALS — BP 152/70 | HR 81 | Ht 70.5 in | Wt 211.0 lb

## 2024-01-21 DIAGNOSIS — E785 Hyperlipidemia, unspecified: Secondary | ICD-10-CM

## 2024-01-21 DIAGNOSIS — I10 Essential (primary) hypertension: Secondary | ICD-10-CM

## 2024-01-21 MED ORDER — FREESTYLE LIBRE 3 PLUS SENSOR MISC: 3 refills | Status: DC

## 2024-01-21 NOTE — Assessment & Plan Note (Signed)
 BP is mildly elevated today.  He is not confident that he took his medications prior to today's appointment.  Current antihypertensive regimen consists of losartan 100 mg daily, Imdur 120 mg daily, diltiazem 240 mg daily, and carvedilol 25 mg twice daily.  No medication changes were made today.  Cardiology follow-up is scheduled for 3/14.

## 2024-01-21 NOTE — Telephone Encounter (Signed)
 Pt needs prescription for libre 3 plus sensors sent to [place].

## 2024-01-21 NOTE — Patient Instructions (Signed)
 It was a pleasure to see you today.  Thank you for giving [place] the opportunity to be involved in your care.  Below is a brief recap of your visit and next steps.  We will plan to see you again in 3 months.   Summary No medication changes today Repeat cholesterol panel Follow up in 3 months

## 2024-01-21 NOTE — Telephone Encounter (Signed)
 done

## 2024-01-21 NOTE — Assessment & Plan Note (Signed)
 Currently prescribed rosuvastatin 20 mg daily and Zetia 10 mg daily.  Repeat lipid panel ordered today.

## 2024-01-21 NOTE — Progress Notes (Signed)
 Established Patient Office Visit  Subjective   Patient ID: Robert Brown, male    DOB: 06-19-1951  Age: 73 y.o. MRN: [id number]  Chief Complaint  Patient presents with   Care Management    Three month follow up   Robert Brown returns to care today for routine follow-up.  He was last evaluated by me in November 2024 as a new patient presenting to establish care.  No medication changes were made at that time and 3-month follow-up was arranged.  In the interim, he has been seen by pulmonology and endocrinology for follow-up.  There have otherwise been no acute interval events. Robert Brown reports feeling well today.  He is asymptomatic and has no acute concerns to discuss.  Past Medical History:  Diagnosis Date   CAD (coronary artery disease)    Cancer (HCC)    Diabetes mellitus without complication (HCC)    Hypertension    Stroke [facility])    Past Surgical History:  Procedure Laterality Date   bone grafts     TUMOR EXCISION     Social History   Tobacco Use   Smoking status: Former    Current packs/day: 0.00    Types: Cigarettes    Quit date: 11/17/2006    Years since quitting: 17.1   Smokeless tobacco: Never  Vaping Use   Vaping status: Never Used  Substance Use Topics   Alcohol use: Not Currently    Comment: former drinker   Drug use: Never   Family History  Problem Relation Age of Onset   Diabetes Mother    Diabetes Father    Allergies  Allergen Reactions   Alcohol Other (See Comments)   Lisinopril     Medicines ending in -pril   Statins    Review of Systems  Constitutional:  Negative for chills and fever.  HENT:  Negative for sore throat.   Respiratory:  Negative for cough and shortness of breath.   Cardiovascular:  Negative for chest pain, palpitations and leg swelling.  Gastrointestinal:  Negative for abdominal pain, blood in stool, constipation, diarrhea, nausea and vomiting.  Genitourinary:  Negative for dysuria and hematuria.  Musculoskeletal:  Negative for  myalgias.  Skin:  Negative for itching and rash.  Neurological:  Negative for dizziness and headaches.  Psychiatric/Behavioral:  Negative for depression and suicidal ideas.      Objective:     BP (!) 152/70   Pulse 81   Ht 5' 10.5" (1.791 m)   Wt 211 lb (95.7 kg)   SpO2 95%   BMI 29.85 kg/m  BP Readings from Last 3 Encounters:  01/21/24 (!) 152/70  12/08/23 (!) 140/73  11/24/23 (!) 171/79   Physical Exam Vitals reviewed.  Constitutional:      General: He is not in acute distress.    Appearance: Normal appearance. He is obese. He is not ill-appearing.  HENT:     Head: Normocephalic and atraumatic.     Right Ear: External ear normal.     Left Ear: External ear normal.     Nose: Nose normal. No congestion or rhinorrhea.     Mouth/Throat:     Mouth: Mucous membranes are moist.     Pharynx: Oropharynx is clear.  Eyes:     General: No scleral icterus.    Extraocular Movements: Extraocular movements intact.     Conjunctiva/sclera: Conjunctivae normal.     Pupils: Pupils are equal, round, and reactive to light.  Cardiovascular:     Rate and Rhythm:  Normal rate and regular rhythm.     Pulses: Normal pulses.     Heart sounds: Normal heart sounds. No murmur heard. Pulmonary:     Effort: Pulmonary effort is normal.     Breath sounds: Normal breath sounds. No wheezing, rhonchi or rales.  Abdominal:     General: Abdomen is flat. Bowel sounds are normal. There is no distension.     Palpations: Abdomen is soft.     Tenderness: There is no abdominal tenderness.  Musculoskeletal:        General: No swelling or deformity. Normal range of motion.     Cervical back: Normal range of motion.  Skin:    General: Skin is warm and dry.     Capillary Refill: Capillary refill takes less than 2 seconds.  Neurological:     General: No focal deficit present.     Mental Status: He is alert and oriented to person, place, and time.     Motor: No weakness.  Psychiatric:        Mood and  Affect: Mood normal.        Behavior: Behavior normal.        Thought Content: Thought content normal.   Last CBC Lab Results  Component Value Date   WBC 11.4 (H) 06/28/2023   HGB 13.6 06/28/2023   HCT 42.7 06/28/2023   MCV 86.3 06/28/2023   MCH 27.5 06/28/2023   RDW 14.3 06/28/2023   PLT 158 06/28/2023   Last metabolic panel Lab Results  Component Value Date   GLUCOSE 117 (H) 08/24/2023   NA 143 08/24/2023   K 3.8 08/24/2023   CL 101 08/24/2023   CO2 28 08/24/2023   BUN 17 08/24/2023   CREATININE 1.23 08/24/2023   EGFR 62 08/24/2023   CALCIUM 9.5 08/24/2023   PROT 6.9 08/24/2023   ALBUMIN 4.3 08/24/2023   LABGLOB 2.6 08/24/2023   BILITOT 0.2 08/24/2023   ALKPHOS 125 (H) 08/24/2023   AST 21 08/24/2023   ALT 26 08/24/2023   ANIONGAP 9 06/28/2023   Last lipids Lab Results  Component Value Date   LDLCALC 142 08/04/2022   TRIG 163 (A) 08/04/2022   Last hemoglobin A1c Lab Results  Component Value Date   HGBA1C 9.4 (A) 12/08/2023   Last thyroid functions Lab Results  Component Value Date   TSH 3.110 08/24/2023   Last vitamin D Lab Results  Component Value Date   VD25OH 34.0 08/24/2023     Assessment & Plan:   Problem List Items Addressed This Visit       HTN (hypertension) (Chronic)   BP is mildly elevated today.  He is not confident that he took his medications prior to today's appointment.  Current antihypertensive regimen consists of losartan 100 mg daily, Imdur 120 mg daily, diltiazem 240 mg daily, and carvedilol 25 mg twice daily.  No medication changes were made today.  Cardiology follow-up is scheduled for 3/14.      HLD (hyperlipidemia) (Chronic)   Currently prescribed rosuvastatin 20 mg daily and Zetia 10 mg daily.  Repeat lipid panel ordered today.       Return in about 3 months (around 04/22/2024).    Billie Lade, MD

## 2024-01-29 ENCOUNTER — Encounter: Payer: Self-pay | Admitting: Internal Medicine

## 2024-01-29 ENCOUNTER — Ambulatory Visit: Payer: Medicare HMO | Attending: Internal Medicine | Admitting: Internal Medicine

## 2024-01-29 VITALS — BP 158/78 | HR 80 | Ht 70.5 in | Wt 217.5 lb

## 2024-01-29 DIAGNOSIS — R079 Chest pain, unspecified: Secondary | ICD-10-CM | POA: Diagnosis not present

## 2024-01-29 DIAGNOSIS — I5032 Chronic diastolic (congestive) heart failure: Secondary | ICD-10-CM

## 2024-01-29 DIAGNOSIS — I25118 Atherosclerotic heart disease of native coronary artery with other forms of angina pectoris: Secondary | ICD-10-CM | POA: Diagnosis not present

## 2024-01-29 DIAGNOSIS — I2510 Atherosclerotic heart disease of native coronary artery without angina pectoris: Secondary | ICD-10-CM

## 2024-01-29 DIAGNOSIS — I2089 Other forms of angina pectoris: Secondary | ICD-10-CM

## 2024-01-29 MED ORDER — ISOSORBIDE MONONITRATE ER 120 MG PO TB24: 120.0000 mg | ORAL_TABLET | Freq: Every day | ORAL | 3 refills | Status: DC

## 2024-01-29 NOTE — Progress Notes (Signed)
 Cardiology Office Note  Date: 01/29/2024   ID: Robert Brown, DOB January 13, 1951, MRN [id number]  PCP:  Billie Lade, MD  Cardiologist:  Marjo Bicker, MD Electrophysiologist:  None    History of Present Illness: Robert Brown is a 73 y.o. male known to have CTO of distal RCA and distal LAD with normal LVEF (on medical management), HTN, DM 2, history of CVA presented to cardiology clinic for follow-up visit.  Patient underwent LHC in 2020 and 2022 for worsening chest pains which showed CTO of distal RCA and CTO of distal LAD with collateralization.  Medical management was recommended with aggressive antianginal therapy. Ranexa was cost prohibitive and hence is currently on BB, CCB and Imdur.  Previously was on amlodipine, was taken off due to leg swelling.  He presented today for follow-up visit.    He has been limiting his activities to avoid angina.  Not taking Imdur.  Only on carvedilol 25 mg twice daily and diltiazem 240 mg once daily.  He does get chest pain when he lifts his wife.  He is a sole caretaker of his wife.  No other symptoms of DOE, orthopnea, PND, presyncope, syncope, leg swelling.  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 His wife has mild dementia and had to help her with ADLs including lifting her every day to the bathroom.  Past Medical History:  Diagnosis Date   CAD (coronary artery disease)    Cancer (HCC)    Diabetes mellitus without complication (HCC)    Hypertension    Stroke [facility])     Past Surgical History:  Procedure Laterality Date   bone grafts     TUMOR EXCISION      Current Outpatient Medications  Medication Sig Dispense Refill   albuterol (PROVENTIL) (2.5 MG/3ML) 0.083% nebulizer solution Take 3 mLs (2.5 mg total) by nebulization every 6 (six) hours as needed for wheezing or shortness of breath. 225 mL 3   aspirin EC 81 MG tablet Take 1 tablet (81 mg total) by mouth daily. Swallow whole.     Blood Glucose Monitoring Suppl (ACCU-CHEK GUIDE) w/Device  KIT Use to check glucose once daily. 1 kit 0   busPIRone (BUSPAR) 5 MG tablet Take by mouth.     carvedilol (COREG) 25 MG tablet Take 25 mg by mouth 2 (two) times daily.     Continuous Glucose Sensor (FREESTYLE LIBRE 3 PLUS SENSOR) MISC Change sensor every 15 days. 6 each 3   DILT-XR 240 MG 24 hr capsule Take 240 mg by mouth daily.     doxycycline (MONODOX) 50 MG capsule Take 50 mg by mouth 2 (two) times daily.     Fluticasone-Umeclidin-Vilant (TRELEGY ELLIPTA) 100-62.5-25 MCG/ACT AEPB One click each am 3 each 3   furosemide (LASIX) 20 MG tablet TAKE 1 TABLET EVERY DAY 90 tablet 3   levothyroxine (SYNTHROID) 88 MCG tablet Take 1 tablet (88 mcg total) by mouth every morning. 90 tablet 1   losartan (COZAAR) 100 MG tablet Take 100 mg by mouth daily.     metFORMIN (GLUCOPHAGE) 500 MG tablet Take 1 tablet (500 mg total) by mouth 2 (two) times daily. 180 tablet 3   nitroGLYCERIN (NITROSTAT) 0.4 MG SL tablet Place 1 tablet (0.4 mg total) under the tongue every 5 (five) minutes as needed for chest pain. If a single episode of chest pain is not relieved by one tablet, the patient will try another within 5 minutes; and if this doesn't relieve the pain, the patient is  instructed to call 911 for transportation to an emergency department. 25 tablet 3   rosuvastatin (CRESTOR) 20 MG tablet Take 20 mg by mouth at bedtime.     tamsulosin (FLOMAX) 0.4 MG CAPS capsule Take 0.4 mg by mouth 2 (two) times daily.     acyclovir (ZOVIRAX) 800 MG tablet SMARTSIG:1 Tablet(s) By Mouth (Patient not taking: Reported on 01/29/2024)     albuterol (VENTOLIN HFA) 108 (90 Base) MCG/ACT inhaler Inhale 1-2 puffs into the lungs every 6 (six) hours as needed for shortness of breath or wheezing. (Patient not taking: Reported on 01/29/2024) 54 g 3   ezetimibe (ZETIA) 10 MG tablet Take 1 tablet (10 mg total) by mouth daily. (Patient not taking: Reported on 01/29/2024) 90 tablet 3   finasteride (PROSCAR) 5 MG tablet Take 5 mg by mouth daily.  (Patient not taking: Reported on 01/29/2024)     isosorbide mononitrate (IMDUR) 120 MG 24 hr tablet Take by mouth. (Patient not taking: Reported on 01/29/2024)     potassium chloride (KLOR-CON M) 10 MEQ tablet Take 10 mEq by mouth daily. (Patient not taking: Reported on 01/29/2024)     valACYclovir (VALTREX) 1000 MG tablet Take 1,000 mg by mouth every 8 (eight) hours. (Patient not taking: Reported on 01/29/2024)     Vitamin D, Ergocalciferol, (DRISDOL) 1.25 MG (50000 UNIT) CAPS capsule Take 50,000 Units by mouth once a week. (Patient not taking: Reported on 01/29/2024)     No current facility-administered medications for this visit.   Allergies:  Alcohol, Lisinopril, and Statins   Social History: The patient  reports that he quit smoking about 17 years ago. His smoking use included cigarettes. He has never used smokeless tobacco. He reports that he does not currently use alcohol. He reports that he does not use drugs.   Family History: The patient's family history includes Diabetes in his father and mother.   ROS:  Please see the history of present illness. Otherwise, complete review of systems is positive for none.  All other systems are reviewed and negative.   Physical Exam: VS:  BP (!) 158/78   Pulse 80   Ht 5' 10.5" (1.791 m)   Wt 217 lb 8 oz (98.7 kg)   SpO2 96%   BMI 30.77 kg/m , BMI Body mass index is 30.77 kg/m.  Wt Readings from Last 3 Encounters:  01/29/24 217 lb 8 oz (98.7 kg)  01/21/24 211 lb (95.7 kg)  12/08/23 223 lb (101.2 kg)    General: Patient appears comfortable at rest. HEENT: Conjunctiva and lids normal, oropharynx clear with moist mucosa. Neck: Supple, no elevated JVP or carotid bruits, no thyromegaly. Lungs: Clear to auscultation, nonlabored breathing at rest. Cardiac: Regular rate and rhythm, no S3 or significant systolic murmur, no pericardial rub. Abdomen: Soft, nontender, no hepatomegaly, bowel sounds present, no guarding or rebound. Extremities: 1+ pitting  edema in bilateral lower extremities, distal pulses 2+. Skin: Warm and dry. Musculoskeletal: No kyphosis. Neuropsychiatric: Alert and oriented x3, affect grossly appropriate.  ECG: NSR  Recent Labwork: 06/28/2023: Hemoglobin 13.6; Platelets 158 08/24/2023: ALT 26; AST 21; BUN 17; Creatinine, Ser 1.23; Potassium 3.8; Sodium 143; TSH 3.110     Component Value Date/Time   TRIG 163 (A) 08/04/2022 0000   LDLCALC 142 08/04/2022 0000    Other Studies Reviewed Today: LHC in 2022 at [facility] LAD lesion is 100% stenosed.   Prox LAD to Mid LAD lesion is 20% stenosed.   Mid RCA to Dist RCA lesion is  100% stenosed.  The angiogram was most notable for chronic occlusion of the distal/apical  LAD and distal RCA.  There is otherwise scattered mild, nonocclusive  disease of the circumflex and proximal LAD.  Findings are unchanged from  the previous study 2 years ago.  Pursue aggressive medical therapy for angina prophylaxis and secondary  prevention.   Echocardiogram in 09/2021 Left Ventricle: Systolic function is normal. EF: 60-65%.    Left Ventricle: Doppler parameters consistent with moderate diastolic  dysfunction and elevated LA pressure.    Left Atrium: Left atrium is mildly dilated.    Mitral Valve: There is mild regurgitation.    Aortic Valve: Trace to mild aortic valve regurgitation   Assessment and Plan:  CAD (CTO distal RCA and distal LAD), medical management: Stable angina, avoiding activities to prevent chest pains.  Chest pains could also be secondary to poorly controlled HTN.  Will manage aggressively.  Not taking Imdur, currently on carvedilol 12 5 mg twice daily and diltiazem 240 mg once daily.  Will continue these medications and resume Imdur 120 mg once daily.  Cost prohibitive, hence not taking.  He was previously on amlodipine but was taken off due to leg swelling.  Continue cardioprotective medications, aspirin 81 mg once daily, rosuvastatin 20 mg nightly and Zetia 10  mg once daily.  Will refer the patient to cardiac rehab.  HLD, not at goal: Continue rosuvastatin 20 mg nightly, Zetia 10 mg once daily.  Chronic diastolic heart failure: Echocardiogram from 2022 at [facility] showed normal LVEF, moderate diastolic dysfunction with elevated LA pressure, mild MR and mild AR.  P.o. Lasix 20 mg once daily.  Valvular heart disease (mild MR and mild AR in 2022): Will repeat echocardiogram.  HTN, poorly controlled: Continue above-stated medications in addition to losartan 100 mg once daily.  Will resume Imdur.  Hopefully this will control blood pressure.     Disposition:  Follow up  6 months  Signed, Klohe Lovering Verne Spurr, MD, 01/29/2024 3:08 PM    [facility] at [facility] [address] [place], [place] [address]

## 2024-01-29 NOTE — Patient Instructions (Addendum)
 Medication Instructions:  Your physician has recommended you make the following change in your medication:   -Start Imdur 120 mg once daily   *If you need a refill on your cardiac medications before your next appointment, please call your pharmacy*   Lab Work: None If you have labs (blood work) drawn today and your tests are completely normal, you will receive your results only by: MyChart Message (if you have MyChart) OR A paper copy in the mail If you have any lab test that is abnormal or we need to change your treatment, we will call you to review the results.   Testing/Procedures:  Your physician has requested that you have an echocardiogram. Echocardiography is a painless test that uses sound waves to create images of your heart. It provides your doctor with information about the size and shape of your heart and how well your heart's chambers and valves are working. This procedure takes approximately one hour. There are no restrictions for this procedure. Please do NOT wear cologne, perfume, aftershave, or lotions (deodorant is allowed). Please arrive 15 minutes prior to your appointment time.  Please note: We ask at that you not bring children with you during ultrasound (echo/ vascular) testing. Due to room size and safety concerns, children are not allowed in the ultrasound rooms during exams. Our front office staff cannot provide observation of children in our lobby area while testing is being conducted. An adult accompanying a patient to their appointment will only be allowed in the ultrasound room at the discretion of the ultrasound technician under special circumstances. We apologize for any inconvenience.  Cardiac Rehab   Follow-Up: At [facility], you and your health needs are our priority.  As part of our continuing mission to provide you with exceptional heart care, we have created designated Provider Care Teams.  These Care Teams include your primary Cardiologist  (physician) and Advanced Practice Providers (APPs -  Physician Assistants and Nurse Practitioners) who all work together to provide you with the care you need, when you need it.  We recommend signing up for the patient portal called "MyChart".  Sign up information is provided on this After Visit Summary.  MyChart is used to connect with patients for Virtual Visits (Telemedicine).  Patients are able to view lab/test results, encounter notes, upcoming appointments, etc.  Non-urgent messages can be sent to your provider as well.   To learn more about what you can do with MyChart, go to [email or web address].    Your next appointment:   6 month(s)  Provider:   You may see Vishnu P Mallipeddi, MD or one of the following Advanced Practice Providers on your designated Care Team:   Randall An, PA-C  Jacolyn Reedy, [place]     Other Instructions  [facility] Billing Phone Number: [phone]

## 2024-02-04 ENCOUNTER — Telehealth: Payer: Self-pay | Admitting: Nurse Practitioner

## 2024-02-04 NOTE — Telephone Encounter (Signed)
 Called and let pt know pt assistance was here

## 2024-02-09 NOTE — Telephone Encounter (Signed)
 Pt picked up pt assistance.

## 2024-02-11 DIAGNOSIS — E785 Hyperlipidemia, unspecified: Secondary | ICD-10-CM | POA: Diagnosis not present

## 2024-02-12 ENCOUNTER — Encounter: Payer: Self-pay | Admitting: Internal Medicine

## 2024-02-12 ENCOUNTER — Other Ambulatory Visit: Payer: Self-pay | Admitting: Internal Medicine

## 2024-02-12 DIAGNOSIS — E785 Hyperlipidemia, unspecified: Secondary | ICD-10-CM

## 2024-02-12 DIAGNOSIS — I2510 Atherosclerotic heart disease of native coronary artery without angina pectoris: Secondary | ICD-10-CM

## 2024-02-12 LAB — LIPID PANEL
Chol/HDL Ratio: 4.1 ratio (ref 0.0–5.0)
Cholesterol, Total: 214 mg/dL — ABNORMAL HIGH (ref 100–199)
HDL: 52 mg/dL (ref >39)
LDL Chol Calc (NIH): 144 mg/dL — ABNORMAL HIGH (ref 0–99)
Triglycerides: 99 mg/dL (ref 0–149)
VLDL Cholesterol Cal: 18 mg/dL (ref 5–40)

## 2024-02-12 MED ORDER — ROSUVASTATIN CALCIUM 40 MG PO TABS: 40.0000 mg | ORAL_TABLET | Freq: Every day | ORAL | 3 refills | Status: DC

## 2024-02-15 ENCOUNTER — Observation Stay (HOSPITAL_COMMUNITY)
Admission: EM | Admit: 2024-02-15 | Discharge: 2024-02-17 | Disposition: A | Discharge status: Home / Self Care | Attending: Internal Medicine | Admitting: Internal Medicine

## 2024-02-15 ENCOUNTER — Other Ambulatory Visit: Payer: Self-pay

## 2024-02-15 ENCOUNTER — Encounter (HOSPITAL_COMMUNITY): Payer: Self-pay

## 2024-02-15 ENCOUNTER — Emergency Department (HOSPITAL_COMMUNITY)

## 2024-02-15 DIAGNOSIS — E039 Hypothyroidism, unspecified: Secondary | ICD-10-CM | POA: Insufficient documentation

## 2024-02-15 DIAGNOSIS — R0689 Other abnormalities of breathing: Secondary | ICD-10-CM | POA: Diagnosis not present

## 2024-02-15 DIAGNOSIS — Z955 Presence of coronary angioplasty implant and graft: Secondary | ICD-10-CM | POA: Insufficient documentation

## 2024-02-15 DIAGNOSIS — N400 Enlarged prostate without lower urinary tract symptoms: Secondary | ICD-10-CM | POA: Insufficient documentation

## 2024-02-15 DIAGNOSIS — I5A Non-ischemic myocardial injury (non-traumatic): Secondary | ICD-10-CM | POA: Diagnosis not present

## 2024-02-15 DIAGNOSIS — E1165 Type 2 diabetes mellitus with hyperglycemia: Secondary | ICD-10-CM

## 2024-02-15 DIAGNOSIS — Z743 Need for continuous supervision: Secondary | ICD-10-CM | POA: Diagnosis not present

## 2024-02-15 DIAGNOSIS — Z7985 Long-term (current) use of injectable non-insulin antidiabetic drugs: Secondary | ICD-10-CM | POA: Insufficient documentation

## 2024-02-15 DIAGNOSIS — J449 Chronic obstructive pulmonary disease, unspecified: Secondary | ICD-10-CM | POA: Insufficient documentation

## 2024-02-15 DIAGNOSIS — E876 Hypokalemia: Secondary | ICD-10-CM | POA: Insufficient documentation

## 2024-02-15 DIAGNOSIS — Z8673 Personal history of transient ischemic attack (TIA), and cerebral infarction without residual deficits: Secondary | ICD-10-CM | POA: Insufficient documentation

## 2024-02-15 DIAGNOSIS — Z87891 Personal history of nicotine dependence: Secondary | ICD-10-CM | POA: Diagnosis not present

## 2024-02-15 DIAGNOSIS — Z859 Personal history of malignant neoplasm, unspecified: Secondary | ICD-10-CM | POA: Diagnosis not present

## 2024-02-15 DIAGNOSIS — E785 Hyperlipidemia, unspecified: Secondary | ICD-10-CM | POA: Diagnosis not present

## 2024-02-15 DIAGNOSIS — E119 Type 2 diabetes mellitus without complications: Secondary | ICD-10-CM | POA: Diagnosis not present

## 2024-02-15 DIAGNOSIS — I10 Essential (primary) hypertension: Secondary | ICD-10-CM | POA: Diagnosis not present

## 2024-02-15 DIAGNOSIS — I2489 Other forms of acute ischemic heart disease: Secondary | ICD-10-CM | POA: Insufficient documentation

## 2024-02-15 DIAGNOSIS — R6889 Other general symptoms and signs: Secondary | ICD-10-CM | POA: Diagnosis not present

## 2024-02-15 DIAGNOSIS — I4891 Unspecified atrial fibrillation: Secondary | ICD-10-CM | POA: Diagnosis not present

## 2024-02-15 DIAGNOSIS — I480 Paroxysmal atrial fibrillation: Principal | ICD-10-CM | POA: Insufficient documentation

## 2024-02-15 DIAGNOSIS — R079 Chest pain, unspecified: Secondary | ICD-10-CM | POA: Diagnosis not present

## 2024-02-15 DIAGNOSIS — Z79899 Other long term (current) drug therapy: Secondary | ICD-10-CM | POA: Diagnosis not present

## 2024-02-15 DIAGNOSIS — Z7984 Long term (current) use of oral hypoglycemic drugs: Secondary | ICD-10-CM | POA: Diagnosis not present

## 2024-02-15 DIAGNOSIS — Z7982 Long term (current) use of aspirin: Secondary | ICD-10-CM | POA: Insufficient documentation

## 2024-02-15 DIAGNOSIS — I25110 Atherosclerotic heart disease of native coronary artery with unstable angina pectoris: Secondary | ICD-10-CM | POA: Diagnosis not present

## 2024-02-15 DIAGNOSIS — I089 Rheumatic multiple valve disease, unspecified: Secondary | ICD-10-CM | POA: Insufficient documentation

## 2024-02-15 DIAGNOSIS — I499 Cardiac arrhythmia, unspecified: Secondary | ICD-10-CM | POA: Diagnosis not present

## 2024-02-15 LAB — BASIC METABOLIC PANEL WITH GFR
Anion gap: 9 (ref 5–15)
BUN: 21 mg/dL (ref 8–23)
CO2: 26 mmol/L (ref 22–32)
Calcium: 8.6 mg/dL — ABNORMAL LOW (ref 8.9–10.3)
Chloride: 105 mmol/L (ref 98–111)
Creatinine, Ser: 1.18 mg/dL (ref 0.61–1.24)
GFR, Estimated: >60 mL/min (ref >60)
Glucose, Bld: 120 mg/dL — ABNORMAL HIGH (ref 70–99)
Potassium: 3.2 mmol/L — ABNORMAL LOW (ref 3.5–5.1)
Sodium: 140 mmol/L (ref 135–145)

## 2024-02-15 LAB — CBC WITH DIFFERENTIAL/PLATELET
Abs Immature Granulocytes: 0.04 10*3/uL (ref 0.00–0.07)
Basophils Absolute: 0 10*3/uL (ref 0.0–0.1)
Basophils Relative: 1 %
Eosinophils Absolute: 0.2 10*3/uL (ref 0.0–0.5)
Eosinophils Relative: 3 %
HCT: 41.1 % (ref 39.0–52.0)
Hemoglobin: 13 g/dL (ref 13.0–17.0)
Immature Granulocytes: 1 %
Lymphocytes Relative: 25 %
Lymphs Abs: 2.2 10*3/uL (ref 0.7–4.0)
MCH: 26.7 pg (ref 26.0–34.0)
MCHC: 31.6 g/dL (ref 30.0–36.0)
MCV: 84.4 fL (ref 80.0–100.0)
Monocytes Absolute: 0.9 10*3/uL (ref 0.1–1.0)
Monocytes Relative: 10 %
Neutro Abs: 5.4 10*3/uL (ref 1.7–7.7)
Neutrophils Relative %: 60 %
Platelets: 186 10*3/uL (ref 150–400)
RBC: 4.87 MIL/uL (ref 4.22–5.81)
RDW: 14.2 % (ref 11.5–15.5)
WBC: 8.7 10*3/uL (ref 4.0–10.5)
nRBC: 0 % (ref 0.0–0.2)

## 2024-02-15 LAB — TSH: TSH: 6.043 u[IU]/mL — ABNORMAL HIGH (ref 0.350–4.500)

## 2024-02-15 LAB — MAGNESIUM: Magnesium: 2 mg/dL (ref 1.7–2.4)

## 2024-02-15 LAB — BRAIN NATRIURETIC PEPTIDE: B Natriuretic Peptide: 84.8 pg/mL (ref 0.0–100.0)

## 2024-02-15 LAB — TROPONIN I (HIGH SENSITIVITY): Troponin I (High Sensitivity): 5 ng/L (ref <18)

## 2024-02-15 LAB — T4, FREE: Free T4: 0.92 ng/dL (ref 0.61–1.12)

## 2024-02-15 MED ORDER — DILTIAZEM LOAD VIA INFUSION
10.0000 mg | Freq: Once | INTRAVENOUS | Status: AC
  Administered 2024-02-15: 10 mg via INTRAVENOUS
  Filled 2024-02-15: qty 10

## 2024-02-15 MED ORDER — DILTIAZEM HCL-DEXTROSE 125-5 MG/125ML-% IV SOLN (PREMIX)
5.0000 mg/h | INTRAVENOUS | Status: AC
  Administered 2024-02-15: 5 mg/h via INTRAVENOUS
  Filled 2024-02-15: qty 125

## 2024-02-15 MED ORDER — POTASSIUM CHLORIDE CRYS ER 20 MEQ PO TBCR
40.0000 meq | EXTENDED_RELEASE_TABLET | Freq: Once | ORAL | Status: AC
  Administered 2024-02-15: 40 meq via ORAL
  Filled 2024-02-15: qty 2

## 2024-02-15 NOTE — ED Triage Notes (Signed)
 Patient BIB [facility] from home due to chest pain. Patient states chest pain started around 1930. Patient took 2 nitroglycerin and 325 of aspirin. Patient states pain was relieved in the truck with O2. Patient hx of 2 MI and a stroke. Patient HR initially in 190s then decreased to the 120s. Patient A&O upon arrival.

## 2024-02-15 NOTE — ED Provider Notes (Signed)
 [place] [facility] DEPARTMENT AT [facility] Provider Note   CSN: [id number] Arrival date & time: 02/15/24  2157     History {Add pertinent medical, surgical, social history, OB history to HPI:1} Chief Complaint  Patient presents with   Chest Pain    Robert Brown is a 73 y.o. male.  The history is provided by the patient and medical records.  Chest Pain Associated symptoms: palpitations   Associated symptoms: no numbness    73 year old male with history of coronary artery disease, congestive heart failure, hyperlipidemia, hypertension, BPH, diabetes, dyslipidemia, prior strokes, hypothyroidism, presenting to the ED for chest pain and palpitations.  This began around 7:30 PM.  Felt pain in the left side of his chest with radiation into the left jaw and left arm.  Also felt like his heart was "beating out of control".  He tried taking aspirin and 2 of his home nitroglycerin without change.  EMS was called and heart rate was initially in the 190s but after starting supplemental oxygen and small bolus of fluids rate has decreased into the 120s.  He is in A-fib with RVR on arrival.  Denies any history of A-fib.  He is not chronically anticoagulated.  Home Medications Prior to Admission medications   Medication Sig Start Date End Date Taking? Authorizing Provider  acyclovir (ZOVIRAX) 800 MG tablet SMARTSIG:1 Tablet(s) By Mouth Patient not taking: Reported on 01/29/2024 01/12/23   [provider]  albuterol (PROVENTIL) (2.5 MG/3ML) 0.083% nebulizer solution Take 3 mLs (2.5 mg total) by nebulization every 6 (six) hours as needed for wheezing or shortness of breath. 01/27/23   Nyoka Cowden, MD  albuterol (VENTOLIN HFA) 108 (90 Base) MCG/ACT inhaler Inhale 1-2 puffs into the lungs every 6 (six) hours as needed for shortness of breath or wheezing. Patient not taking: Reported on 01/29/2024 01/27/23   Nyoka Cowden, MD  aspirin EC 81 MG tablet Take 1 tablet (81 mg total) by  mouth daily. Swallow whole. 08/12/23   Mallipeddi, Vishnu P, MD  Blood Glucose Monitoring Suppl (ACCU-CHEK GUIDE) w/Device KIT Use to check glucose once daily. 02/10/23   Dani Gobble, NP  busPIRone (BUSPAR) 5 MG tablet Take by mouth. 07/03/21   [provider]  carvedilol (COREG) 25 MG tablet Take 25 mg by mouth 2 (two) times daily. 02/21/21   [provider]  Continuous Glucose Sensor (FREESTYLE LIBRE 3 PLUS SENSOR) MISC Change sensor every 15 days. 01/21/24   Dani Gobble, NP  DILT-XR 240 MG 24 hr capsule Take 240 mg by mouth daily. 08/11/23   [provider]  doxycycline (MONODOX) 50 MG capsule Take 50 mg by mouth 2 (two) times daily.    [provider]  ezetimibe (ZETIA) 10 MG tablet Take 1 tablet (10 mg total) by mouth daily. Patient not taking: Reported on 01/29/2024 08/12/23 12/08/23  Mallipeddi, Vishnu P, MD  finasteride (PROSCAR) 5 MG tablet Take 5 mg by mouth daily. Patient not taking: Reported on 01/29/2024 02/09/20   [provider]  Fluticasone-Umeclidin-Vilant [facility] ELLIPTA) 100-62.5-25 MCG/ACT AEPB One click each am 11/24/23   Nyoka Cowden, MD  furosemide (LASIX) 20 MG tablet TAKE 1 TABLET EVERY DAY 11/13/23   Mallipeddi, Vishnu P, MD  isosorbide mononitrate (IMDUR) 120 MG 24 hr tablet Take 1 tablet (120 mg total) by mouth daily. 01/29/24   Mallipeddi, Vishnu P, MD  levothyroxine (SYNTHROID) 88 MCG tablet Take 1 tablet (88 mcg total) by mouth every morning. 12/08/23  Dani Gobble, NP  losartan (COZAAR) 100 MG tablet Take 100 mg by mouth daily.    [provider]  metFORMIN (GLUCOPHAGE) 500 MG tablet Take 1 tablet (500 mg total) by mouth 2 (two) times daily. 12/08/23   Dani Gobble, NP  nitroGLYCERIN (NITROSTAT) 0.4 MG SL tablet Place 1 tablet (0.4 mg total) under the tongue every 5 (five) minutes as needed for chest pain. If a single episode of chest pain is not relieved by one tablet, the patient will try another  within 5 minutes; and if this doesn't relieve the pain, the patient is instructed to call 911 for transportation to an emergency department. 08/12/23   Mallipeddi, Vishnu P, MD  potassium chloride (KLOR-CON M) 10 MEQ tablet Take 10 mEq by mouth daily. Patient not taking: Reported on 01/29/2024    [provider]  rosuvastatin (CRESTOR) 40 MG tablet Take 1 tablet (40 mg total) by mouth daily. 02/12/24   Billie Lade, MD  tamsulosin (FLOMAX) 0.4 MG CAPS capsule Take 0.4 mg by mouth 2 (two) times daily. 12/23/19   [provider]  valACYclovir (VALTREX) 1000 MG tablet Take 1,000 mg by mouth every 8 (eight) hours. Patient not taking: Reported on 01/29/2024 12/02/22   [provider]  Vitamin D, Ergocalciferol, (DRISDOL) 1.25 MG (50000 UNIT) CAPS capsule Take 50,000 Units by mouth once a week. Patient not taking: Reported on 01/29/2024 01/29/23   [provider]      Allergies    Alcohol, Lisinopril, and Statins    Review of Systems   Review of Systems  Cardiovascular:  Positive for chest pain and palpitations.  Musculoskeletal:  Positive for gait problem and joint swelling.  Skin:  Negative for wound.  Neurological:  Negative for numbness.  All other systems reviewed and are negative.   Physical Exam Updated Vital Signs BP (!) 146/106 (BP Location: Right Arm)   Pulse (!) 138   Temp 98.3 F (36.8 C)   Resp 15   Ht 5' 10.5" (1.791 m)   Wt 95.7 kg   SpO2 99%   BMI 29.85 kg/m   Physical Exam Vitals and nursing note reviewed.  Constitutional:      Appearance: He is well-developed.  HENT:     Head: Normocephalic and atraumatic.  Eyes:     Conjunctiva/sclera: Conjunctivae normal.     Pupils: Pupils are equal, round, and reactive to light.  Cardiovascular:     Rate and Rhythm: Tachycardia present. Rhythm irregularly irregular.     Heart sounds: Normal heart sounds.     Comments: AFIB, rate 140's- 150's Pulmonary:     Effort: Pulmonary effort is  normal.     Breath sounds: Normal breath sounds.  Abdominal:     General: Bowel sounds are normal.     Palpations: Abdomen is soft.  Musculoskeletal:        General: Normal range of motion.     Cervical back: Normal range of motion.  Skin:    General: Skin is warm and dry.  Neurological:     Mental Status: He is alert and oriented to person, place, and time.     ED Results / Procedures / Treatments   Labs (all labs ordered are listed, but only abnormal results are displayed) Labs Reviewed  BASIC METABOLIC PANEL WITH GFR - Abnormal; Notable for the following components:      Result Value   Potassium 3.2 (*)    Glucose, Bld 120 (*)  Calcium 8.6 (*)    All other components within normal limits  TSH - Abnormal; Notable for the following components:   TSH 6.043 (*)    All other components within normal limits  CBC WITH DIFFERENTIAL/PLATELET  MAGNESIUM  T4, FREE  BRAIN NATRIURETIC PEPTIDE  TROPONIN I (HIGH SENSITIVITY)    EKG None  Radiology DG Chest Port 1 View Result Date: 02/15/2024 CLINICAL DATA:  AFIB, chest pain EXAM: PORTABLE CHEST 1 VIEW COMPARISON:  Chest x-ray 04/28/2022 FINDINGS: The heart and mediastinal contours are unchanged. No focal consolidation. No pulmonary edema. No pleural effusion. No pneumothorax. No acute osseous abnormality. Bilateral acromioclavicular joint degenerative changes. IMPRESSION: No active disease. Electronically Signed   By: Tish Frederickson M.D.   On: 02/15/2024 22:43    Procedures Procedures  {Document cardiac monitor, telemetry assessment procedure when appropriate:1}  CRITICAL CARE Performed by: Garlon Hatchet   Total critical care time: 45 minutes  Critical care time was exclusive of separately billable procedures and treating other patients.  Critical care was necessary to treat or prevent imminent or life-threatening deterioration.  Critical care was time spent personally by me on the following activities: development of  treatment plan with patient and/or surrogate as well as nursing, discussions with consultants, evaluation of patient's response to treatment, examination of patient, obtaining history from patient or surrogate, ordering and performing treatments and interventions, ordering and review of laboratory studies, ordering and review of radiographic studies, pulse oximetry and re-evaluation of patient's condition.   Medications Ordered in ED Medications  diltiazem (CARDIZEM) 1 mg/mL load via infusion 10 mg (has no administration in time range)    And  diltiazem (CARDIZEM) 125 mg in dextrose 5% 125 mL (1 mg/mL) infusion (has no administration in time range)    ED Course/ Medical Decision Making/ A&P   {   Click here for ABCD2, HEART and other calculatorsREFRESH Note before signing :1}                              Medical Decision Making Amount and/or Complexity of Data Reviewed Labs: ordered. Radiology: ordered and independent interpretation performed. ECG/medicine tests: ordered and independent interpretation performed.  Risk Prescription drug management. Decision regarding hospitalization.   73 year old male presenting to the ED with chest pain and palpitations.  Began around 7:30 PM.  Feels like his heart is "beating out of control".  He arrives in A-fib with rate in the 140s to 150s.  He is afebrile and nontoxic in appearance.  States his symptoms have overall improved since onset.  EKG A-fib, no acute ischemia noted.  Will plan for labs, chest x-ray.  He has started on Cardizem bolus and drip.  Anticipate he will need admission.  11:27 PM Remains in AFIB but rate has slowed to 110's, he reports feeling improved.  Labs are grossly reassuring without leukocytosis.  Potassium is a little low at 3.2.  Will be given oral replacement.  Magnesium is normal at 2.  TSH is elevated, this may be contributing.  Chest x-ray is clear.  Troponin is normal.  He will require admission.  Final Clinical  Impression(s) / ED Diagnoses Final diagnoses:  None    Rx / DC Orders ED Discharge Orders     None

## 2024-02-16 ENCOUNTER — Other Ambulatory Visit (HOSPITAL_COMMUNITY): Payer: Self-pay

## 2024-02-16 ENCOUNTER — Observation Stay (HOSPITAL_BASED_OUTPATIENT_CLINIC_OR_DEPARTMENT_OTHER)

## 2024-02-16 ENCOUNTER — Other Ambulatory Visit: Payer: Self-pay | Admitting: Nurse Practitioner

## 2024-02-16 DIAGNOSIS — I5A Non-ischemic myocardial injury (non-traumatic): Secondary | ICD-10-CM

## 2024-02-16 DIAGNOSIS — I4891 Unspecified atrial fibrillation: Secondary | ICD-10-CM | POA: Diagnosis present

## 2024-02-16 DIAGNOSIS — I10 Essential (primary) hypertension: Secondary | ICD-10-CM | POA: Diagnosis not present

## 2024-02-16 DIAGNOSIS — I480 Paroxysmal atrial fibrillation: Principal | ICD-10-CM

## 2024-02-16 DIAGNOSIS — G4733 Obstructive sleep apnea (adult) (pediatric): Secondary | ICD-10-CM | POA: Diagnosis not present

## 2024-02-16 DIAGNOSIS — E039 Hypothyroidism, unspecified: Secondary | ICD-10-CM

## 2024-02-16 DIAGNOSIS — I5032 Chronic diastolic (congestive) heart failure: Secondary | ICD-10-CM | POA: Diagnosis not present

## 2024-02-16 DIAGNOSIS — E785 Hyperlipidemia, unspecified: Secondary | ICD-10-CM | POA: Diagnosis not present

## 2024-02-16 DIAGNOSIS — I2510 Atherosclerotic heart disease of native coronary artery without angina pectoris: Secondary | ICD-10-CM | POA: Diagnosis not present

## 2024-02-16 DIAGNOSIS — E876 Hypokalemia: Secondary | ICD-10-CM

## 2024-02-16 LAB — PHOSPHORUS: Phosphorus: 3.1 mg/dL (ref 2.5–4.6)

## 2024-02-16 LAB — ECHOCARDIOGRAM COMPLETE
AR max vel: 2.48 cm2
AV Area VTI: 2.43 cm2
AV Area mean vel: 2.3 cm2
AV Mean grad: 7 mmHg
AV Peak grad: 12.8 mmHg
Ao pk vel: 1.79 m/s
Area-P 1/2: 2.93 cm2
Height: 70.5 in
MV VTI: 2.69 cm2
S' Lateral: 2.6 cm
Weight: 3376 [oz_av]

## 2024-02-16 LAB — BASIC METABOLIC PANEL WITH GFR
Anion gap: 8 (ref 5–15)
BUN: 18 mg/dL (ref 8–23)
CO2: 26 mmol/L (ref 22–32)
Calcium: 8.5 mg/dL — ABNORMAL LOW (ref 8.9–10.3)
Chloride: 106 mmol/L (ref 98–111)
Creatinine, Ser: 1.03 mg/dL (ref 0.61–1.24)
GFR, Estimated: >60 mL/min (ref >60)
Glucose, Bld: 107 mg/dL — ABNORMAL HIGH (ref 70–99)
Potassium: 3.7 mmol/L (ref 3.5–5.1)
Sodium: 140 mmol/L (ref 135–145)

## 2024-02-16 LAB — CBC
HCT: 41.3 % (ref 39.0–52.0)
Hemoglobin: 13.1 g/dL (ref 13.0–17.0)
MCH: 26.7 pg (ref 26.0–34.0)
MCHC: 31.7 g/dL (ref 30.0–36.0)
MCV: 84.1 fL (ref 80.0–100.0)
Platelets: 162 10*3/uL (ref 150–400)
RBC: 4.91 MIL/uL (ref 4.22–5.81)
RDW: 14.5 % (ref 11.5–15.5)
WBC: 8.2 10*3/uL (ref 4.0–10.5)
nRBC: 0 % (ref 0.0–0.2)

## 2024-02-16 LAB — CBG MONITORING, ED
Glucose-Capillary: 75 mg/dL (ref 70–99)
Glucose-Capillary: 124 mg/dL — ABNORMAL HIGH (ref 70–99)

## 2024-02-16 LAB — GLUCOSE, CAPILLARY
Glucose-Capillary: 149 mg/dL — ABNORMAL HIGH (ref 70–99)
Glucose-Capillary: 109 mg/dL — ABNORMAL HIGH (ref 70–99)

## 2024-02-16 LAB — HEMOGLOBIN A1C
Hgb A1c MFr Bld: 6.6 % — ABNORMAL HIGH (ref 4.8–5.6)
Mean Plasma Glucose: 142.72 mg/dL

## 2024-02-16 LAB — MAGNESIUM: Magnesium: 2 mg/dL (ref 1.7–2.4)

## 2024-02-16 LAB — TROPONIN I (HIGH SENSITIVITY): Troponin I (High Sensitivity): 21 ng/L — ABNORMAL HIGH (ref <18)

## 2024-02-16 MED ORDER — UMECLIDINIUM BROMIDE 62.5 MCG/ACT IN AEPB
1.0000 | INHALATION_SPRAY | Freq: Every day | RESPIRATORY_TRACT | Status: DC
  Administered 2024-02-17: 1 via RESPIRATORY_TRACT
  Filled 2024-02-16: qty 7

## 2024-02-16 MED ORDER — ACETAMINOPHEN 500 MG PO TABS: 1000.0000 mg | ORAL_TABLET | Freq: Four times a day (QID) | ORAL | Status: DC | PRN

## 2024-02-16 MED ORDER — SODIUM CHLORIDE 0.9% FLUSH
3.0000 mL | Freq: Two times a day (BID) | INTRAVENOUS | Status: DC
  Administered 2024-02-16 – 2024-02-17 (×3): 3 mL via INTRAVENOUS

## 2024-02-16 MED ORDER — CARVEDILOL 25 MG PO TABS
25.0000 mg | ORAL_TABLET | Freq: Two times a day (BID) | ORAL | Status: DC
  Administered 2024-02-16 – 2024-02-17 (×3): 25 mg via ORAL
  Filled 2024-02-16 (×2): qty 1
  Filled 2024-02-16: qty 2

## 2024-02-16 MED ORDER — EZETIMIBE 10 MG PO TABS
10.0000 mg | ORAL_TABLET | Freq: Every day | ORAL | Status: DC
  Administered 2024-02-16 – 2024-02-17 (×2): 10 mg via ORAL
  Filled 2024-02-16 (×2): qty 1

## 2024-02-16 MED ORDER — MELATONIN 3 MG PO TABS
6.0000 mg | ORAL_TABLET | Freq: Every evening | ORAL | Status: DC | PRN
  Administered 2024-02-16: 6 mg via ORAL
  Filled 2024-02-16: qty 2

## 2024-02-16 MED ORDER — POTASSIUM CHLORIDE CRYS ER 20 MEQ PO TBCR
40.0000 meq | EXTENDED_RELEASE_TABLET | Freq: Once | ORAL | Status: AC
  Administered 2024-02-16: 40 meq via ORAL
  Filled 2024-02-16: qty 2

## 2024-02-16 MED ORDER — DOXYCYCLINE HYCLATE 100 MG PO TABS
50.0000 mg | ORAL_TABLET | Freq: Every day | ORAL | Status: DC
  Administered 2024-02-16 – 2024-02-17 (×2): 50 mg via ORAL
  Filled 2024-02-16 (×2): qty 1

## 2024-02-16 MED ORDER — BUSPIRONE HCL 5 MG PO TABS: 5.0000 mg | ORAL_TABLET | Freq: Every day | ORAL | Status: DC | PRN

## 2024-02-16 MED ORDER — ISOSORBIDE MONONITRATE ER 60 MG PO TB24
120.0000 mg | ORAL_TABLET | Freq: Every day | ORAL | Status: DC
  Administered 2024-02-16 – 2024-02-17 (×2): 120 mg via ORAL
  Filled 2024-02-16: qty 4
  Filled 2024-02-16: qty 2

## 2024-02-16 MED ORDER — ASPIRIN 81 MG PO TBEC: 81.0000 mg | DELAYED_RELEASE_TABLET | Freq: Every day | ORAL | Status: DC

## 2024-02-16 MED ORDER — INSULIN ASPART 100 UNIT/ML IJ SOLN: 0.0000 [IU] | Freq: Every day | INTRAMUSCULAR | Status: DC

## 2024-02-16 MED ORDER — ROSUVASTATIN CALCIUM 20 MG PO TABS
40.0000 mg | ORAL_TABLET | Freq: Every day | ORAL | Status: DC
  Administered 2024-02-16 – 2024-02-17 (×2): 40 mg via ORAL
  Filled 2024-02-16 (×2): qty 2

## 2024-02-16 MED ORDER — APIXABAN 5 MG PO TABS
5.0000 mg | ORAL_TABLET | Freq: Two times a day (BID) | ORAL | Status: DC
  Administered 2024-02-16 – 2024-02-17 (×3): 5 mg via ORAL
  Filled 2024-02-16 (×3): qty 1

## 2024-02-16 MED ORDER — LOSARTAN POTASSIUM 50 MG PO TABS
100.0000 mg | ORAL_TABLET | Freq: Every day | ORAL | Status: DC
  Administered 2024-02-16 – 2024-02-17 (×2): 100 mg via ORAL
  Filled 2024-02-16 (×2): qty 2

## 2024-02-16 MED ORDER — FLUTICASONE FUROATE-VILANTEROL 100-25 MCG/ACT IN AEPB
1.0000 | INHALATION_SPRAY | Freq: Every day | RESPIRATORY_TRACT | Status: DC
  Administered 2024-02-17: 1 via RESPIRATORY_TRACT
  Filled 2024-02-16: qty 28

## 2024-02-16 MED ORDER — LEVOTHYROXINE SODIUM 88 MCG PO TABS
88.0000 ug | ORAL_TABLET | Freq: Every morning | ORAL | Status: DC
  Administered 2024-02-16 – 2024-02-17 (×2): 88 ug via ORAL
  Filled 2024-02-16 (×2): qty 1

## 2024-02-16 MED ORDER — ONDANSETRON HCL 4 MG/2ML IJ SOLN: 4.0000 mg | Freq: Four times a day (QID) | INTRAMUSCULAR | Status: DC | PRN

## 2024-02-16 MED ORDER — POLYETHYLENE GLYCOL 3350 17 G PO PACK: 17.0000 g | PACK | Freq: Every day | ORAL | Status: DC | PRN

## 2024-02-16 MED ORDER — AMIODARONE HCL 200 MG PO TABS
400.0000 mg | ORAL_TABLET | Freq: Two times a day (BID) | ORAL | Status: DC
Start: 2024-02-16 — End: 2024-02-21
  Administered 2024-02-16 – 2024-02-17 (×3): 400 mg via ORAL
  Filled 2024-02-16 (×3): qty 2

## 2024-02-16 MED ORDER — DILTIAZEM HCL ER COATED BEADS 180 MG PO CP24
360.0000 mg | ORAL_CAPSULE | Freq: Every day | ORAL | Status: DC
  Administered 2024-02-16: 360 mg via ORAL
  Filled 2024-02-16: qty 2
  Filled 2024-02-16: qty 3
  Filled 2024-02-16: qty 2

## 2024-02-16 MED ORDER — FINASTERIDE 5 MG PO TABS
5.0000 mg | ORAL_TABLET | Freq: Every day | ORAL | Status: DC
  Administered 2024-02-16 – 2024-02-17 (×2): 5 mg via ORAL
  Filled 2024-02-16 (×2): qty 1

## 2024-02-16 MED ORDER — TAMSULOSIN HCL 0.4 MG PO CAPS
0.4000 mg | ORAL_CAPSULE | Freq: Two times a day (BID) | ORAL | Status: DC
  Administered 2024-02-16 – 2024-02-17 (×3): 0.4 mg via ORAL
  Filled 2024-02-16 (×3): qty 1

## 2024-02-16 MED ORDER — INSULIN ASPART 100 UNIT/ML IJ SOLN
0.0000 [IU] | Freq: Three times a day (TID) | INTRAMUSCULAR | Status: DC
  Administered 2024-02-16 (×2): 1 [IU] via SUBCUTANEOUS
  Administered 2024-02-17: 3 [IU] via SUBCUTANEOUS

## 2024-02-16 NOTE — H&P (Signed)
 History and Physical    Quayshawn Nin ZOX:[id number] DOB: 1951/03/03 DOA: 02/15/2024  PCP: Billie Lade, MD   Patient coming from: Home   Chief Complaint:  Chief Complaint  Patient presents with   Chest Pain    HPI:  Robert Brown is a 73 y.o. male Jehovah's Witness, with hx of coronary disease with CTO RCA and distal LAD, chronic angina, CVA, COPD, hypertension, diabetes, BPH, who presented from home with chest pain.  Reports that he woke from sleep around 7:15 PM yesterday evening with severe central chest pressure which radiated to his left arm, neck and jaw.  States this is similar to pain with prior MI and even more severe than previous MIs.  Associated he was having palpitations and heart racing.  He took aspirin and nitroglycerin although did not improve and so called EMS.  Chest pain stopped while in ED around the time that he converted out of A-fib.  Notably has not been taking daily aspirin, states he avoids sometimes due to the number of medications that he is taking.  Denies any prior history of bleeding events.  Otherwise denies any recent illness   Review of Systems:  ROS complete and negative except as marked above   Allergies  Allergen Reactions   Alcohol Other (See Comments)    Prefers not to drink   Lisinopril     Medicines ending in -pril - unknown reaction   Statins     myalgia    Prior to Admission medications   Medication Sig Start Date End Date Taking? Authorizing Provider  aspirin EC 81 MG tablet Take 1 tablet (81 mg total) by mouth daily. Swallow whole. 08/12/23  Yes Mallipeddi, Vishnu P, MD  busPIRone (BUSPAR) 5 MG tablet Take 5 mg by mouth daily as needed (for anxiety). 07/03/21  Yes [provider]  carvedilol (COREG) 25 MG tablet Take 25 mg by mouth 2 (two) times daily. 02/21/21  Yes [provider]  DILT-XR 240 MG 24 hr capsule Take 240 mg by mouth daily. 08/11/23  Yes [provider]  doxycycline (VIBRAMYCIN) 50 MG capsule  Take 50 mg by mouth daily. 02/03/24  Yes [provider]  finasteride (PROSCAR) 5 MG tablet Take 5 mg by mouth daily. 02/09/20  Yes [provider]  Fluticasone-Umeclidin-Vilant (TRELEGY ELLIPTA) 100-62.5-25 MCG/ACT AEPB One click each am Patient taking differently: Take 1 puff by mouth daily as needed (for shortness of breath). 11/24/23  Yes Nyoka Cowden, MD  isosorbide mononitrate (IMDUR) 120 MG 24 hr tablet Take 1 tablet (120 mg total) by mouth daily. 01/29/24  Yes Mallipeddi, Vishnu P, MD  levothyroxine (SYNTHROID) 88 MCG tablet Take 1 tablet (88 mcg total) by mouth every morning. 12/08/23  Yes Reardon, Freddi Starr, NP  losartan (COZAAR) 100 MG tablet Take 100 mg by mouth daily.   Yes [provider]  metFORMIN (GLUCOPHAGE) 500 MG tablet Take 1 tablet (500 mg total) by mouth 2 (two) times daily. 12/08/23  Yes Reardon, Freddi Starr, NP  nitroGLYCERIN (NITROSTAT) 0.4 MG SL tablet Place 1 tablet (0.4 mg total) under the tongue every 5 (five) minutes as needed for chest pain. If a single episode of chest pain is not relieved by one tablet, the patient will try another within 5 minutes; and if this doesn't relieve the pain, the patient is instructed to call 911 for transportation to an emergency department. 08/12/23  Yes Mallipeddi, Vishnu P, MD  rosuvastatin (CRESTOR) 40 MG tablet Take 1 tablet (40  mg total) by mouth daily. 02/12/24  Yes Billie Lade, MD  Semaglutide, 1 MG/DOSE, (OZEMPIC, 1 MG/DOSE,) 2 MG/1.5ML SOPN Inject 1 mg into the skin every Tuesday.   Yes [provider]  tamsulosin (FLOMAX) 0.4 MG CAPS capsule Take 0.4 mg by mouth 2 (two) times daily. 12/23/19  Yes [provider]  albuterol (PROVENTIL) (2.5 MG/3ML) 0.083% nebulizer solution Take 3 mLs (2.5 mg total) by nebulization every 6 (six) hours as needed for wheezing or shortness of breath. 01/27/23   Nyoka Cowden, MD  albuterol (VENTOLIN HFA) 108 (90 Base) MCG/ACT inhaler Inhale 1-2 puffs into the  lungs every 6 (six) hours as needed for shortness of breath or wheezing. Patient not taking: Reported on 01/29/2024 01/27/23   Nyoka Cowden, MD  Blood Glucose Monitoring Suppl (ACCU-CHEK GUIDE) w/Device KIT Use to check glucose once daily. 02/10/23   Dani Gobble, NP  Continuous Glucose Sensor (FREESTYLE LIBRE 3 PLUS SENSOR) MISC Change sensor every 15 days. 01/21/24   Dani Gobble, NP  ezetimibe (ZETIA) 10 MG tablet Take 1 tablet (10 mg total) by mouth daily. Patient not taking: Reported on 01/29/2024 08/12/23 12/08/23  Mallipeddi, Vishnu P, MD  furosemide (LASIX) 20 MG tablet TAKE 1 TABLET EVERY DAY Patient not taking: Reported on 02/16/2024 11/13/23   Mallipeddi, Vishnu P, MD  potassium chloride (KLOR-CON M) 10 MEQ tablet Take 10 mEq by mouth daily. Patient not taking: Reported on 01/29/2024    [provider]    Past Medical History:  Diagnosis Date   CAD (coronary artery disease)    Cancer (HCC)    Diabetes mellitus without complication (HCC)    Hypertension    Stroke [facility])     Past Surgical History:  Procedure Laterality Date   bone grafts     TUMOR EXCISION       reports that he quit smoking about 17 years ago. His smoking use included cigarettes. He has never used smokeless tobacco. He reports that he does not currently use alcohol. He reports that he does not use drugs.  Family History  Problem Relation Age of Onset   Diabetes Mother    Diabetes Father      Physical Exam: Vitals:   02/16/24 0126 02/16/24 0145 02/16/24 0230 02/16/24 0245  BP:  133/66 (!) 148/83 (!) 148/81  Pulse:  80 78 73  Resp:  18 (!) 21 (!) 21  Temp: (!) 97.5 F (36.4 C)     TempSrc: Oral     SpO2:  94% 95% 97%  Weight:      Height:        Gen: Awake, alert, chronically ill-appearing CV: Regular, normal S1, S2, no murmurs  Resp: Normal WOB, minimal expiratory wheezing.  Otherwise clear. Abd: Flat, normoactive, nontender MSK: Symmetric, there is 1-2+ edema which tapers off  towards the mid shin  Skin: Rhinophyma.  No rashes or lesions to exposed skin  Neuro: Alert and interactive  Psych: euthymic, appropriate    Data review:   Labs reviewed, notable for:   BNP within normal limit High-sensitivity troponin 5 -> 21 LDL 144 K3.2 TSH 6 but T4 within normal at 0.19  Micro:  Results for orders placed or performed during the hospital encounter of 04/28/22  Coronavirus (COVID-19) with Influenza A and Influenza B     Status: None   Collection Time: 04/28/22 12:14 PM   Specimen: Nasopharyngeal   Naso  Result Value Ref Range Status   SARS-CoV-2, NAA Not  Detected Not Detected Final   Influenza A, NAA Not Detected Not Detected Final   Influenza B, NAA Not Detected Not Detected Final   Test Information: Comment  Final    Comment: This nucleic acid amplification test was developed and its performance characteristics determined by [employer]. Nucleic acid amplification tests include RT-PCR and TMA. This test has not been FDA cleared or approved. This test has been authorized by FDA under an Emergency Use Authorization (EUA). This test is only authorized for the duration of time the declaration that circumstances exist justifying the authorization of the emergency use of in vitro diagnostic tests for detection of SARS-CoV-2 virus and/or diagnosis of COVID-19 infection under section 564(b)(1) of the Act, 21 U.S.C. 161WRU-0(A) (1), unless the authorization is terminated or revoked sooner. When diagnostic testing is negative, the possibility of a false negative result should be considered in the context of a patient's recent exposures and the presence of clinical signs and symptoms consistent with COVID-19. An individual without symptoms of COVID-19 and who is not shedding SARS-CoV-2 virus wo uld expect to have a negative (not detected) result in this assay.     Imaging reviewed:  DG Chest Port 1 View Result Date: 02/15/2024 CLINICAL DATA:  AFIB,  chest pain EXAM: PORTABLE CHEST 1 VIEW COMPARISON:  Chest x-ray 04/28/2022 FINDINGS: The heart and mediastinal contours are unchanged. No focal consolidation. No pulmonary edema. No pleural effusion. No pneumothorax. No acute osseous abnormality. Bilateral acromioclavicular joint degenerative changes. IMPRESSION: No active disease. Electronically Signed   By: Tish Frederickson M.D.   On: 02/15/2024 22:43    EKG:  Personally reviewed initial EKG A-fib RVR with ST depression inferolaterally. Subsequent EKG sinus rhythm with scooped ST laterally improved from previous.  ED Course:  On EMS arrival was in A-fib with rates in the 190s.  Small bolus of fluid in the field.  In the ED treated with diltiazem drip and spontaneously converted to sinus rhythm.   Assessment/Plan:  73 y.o. male with hx Jehovah's Witness, with hx of coronary disease with CTO RCA and distal LAD, chronic angina, CVA, COPD, hypertension, diabetes, hypothyroidism, BPH, who presented from home with anginal type pain in the setting of A-fib with RVR.  A-fib with RVR, new onset, spontaneously converted to sinus rhythm No prior history of A-fib. Rate in the 190s with EMS with associated anginal pain.  Associated ischemic changes on EKG.  Minimal elevation in high-sensitivity Trop per below.  Treated with diltiazem drip in the ED and spontaneously converted to sinus rhythm.  CHA2DS2-VASc is 6.  -Routine cardiology consult in the a.m., not contacted overnight -Increase his home diltiazem to 360 mg and overlap briefly with diltiazem drip.  Also on carvedilol twice daily which we will continue. -Discussed risk-benefit of anticoagulation, he is okay with starting Eliquis 5 mg twice daily -Clinically he has poor tolerance of tachyarrhythmia in the setting of his underlying CAD and question whether or not rhythm control strategy with amiodarone may be an option for him.;  Appreciate cardiology input.  Acute myocardial injury Demand ischemia  associated with tachyarrhythmia CAD with CTO RCA, distal LAD, chronic angina Developed typical anginal pain which resolved when he converted out of A-fib.  Initial EKG with A-fib rates in the 150s with ischemic changes with ST depression inferolaterally.  High-sensitivity troponin with minimal elevation 5 -> 21. -Management directed at his arrhythmia per above. - For now have transition to DOAC monotherapy, appreciate cards input re: need for aspirin considering severity of  his underlying CAD with CTO's -Continue his antianginal medications including diltiazem increased per above, carvedilol 25 mg twice daily, isosorbide mononitrate 120 mg daily. -LDL not at goal (144). not taking Zetia at home.  Consider for Repatha outpatient.  Continue rosuvastatin and Zetia here.  Hypokalemia Repleted  Chronic medical problems: History of CVA: Antiplatelet /statin per above COPD: Continue home Trelegy Ellipta equivalent Hypertension: Increase diltiazem per above.  Otherwise continue home Coreg, isosorbide mononitrate, losartan.  Diabetes type 2: Last A1c was within 3 months and not at goal (9.4% in 1/'25).  Will need tighter glycemic control to reduce risk of major adverse cardiac events.  Home regimen is metformin, Ozempic.  SSI while inpatient Hypothyroidism: Continue home levothyroxine.  TSH elevated but free T4 within normal limit.  Repeat TFT outpatient. BPH: Continue home finasteride, tamsulosin   Body mass index is 29.85 kg/m.    DVT prophylaxis:  Eliquis Code Status:  Full Code Diet:  Diet Orders (From admission, onward)     Start     Ordered   02/16/24 0331  Diet Heart Room service appropriate? Yes; Fluid consistency: Thin  Diet effective now       Question Answer Comment  Room service appropriate? Yes   Fluid consistency: Thin      02/16/24 0332           Family Communication:  No   Consults:  None   Admission status:   Observation, Telemetry bed  Severity of Illness: The  appropriate patient status for this patient is OBSERVATION. Observation status is judged to be reasonable and necessary in order to provide the required intensity of service to ensure the patient's safety. The patient's presenting symptoms, physical exam findings, and initial radiographic and laboratory data in the context of their medical condition is felt to place them at decreased risk for further clinical deterioration. Furthermore, it is anticipated that the patient will be medically stable for discharge from the hospital within 2 midnights of admission.    Dolly Rias, MD [facility]  How to contact the [facility] Attending or Consulting provider 7A - 7P or covering provider during after hours 7P -7A, for this patient.  Check the care team in [facility] and look for a) attending/consulting [facility] provider listed and b) the [facility] team listed Log into [email or web address] and use [facility] universal password to access. If you do not have the password, please contact the hospital operator. Locate the [facility] provider you are looking for under [facility] and page to a number that you can be directly reached. If you still have difficulty reaching the provider, please page the [facility] (Director on Call) for the Hospitalists listed on amion for assistance.  02/16/2024, 4:08 AM

## 2024-02-16 NOTE — Progress Notes (Signed)
 Patient seen and examined, admitted by Dr. Lazarus Salines this morning  Briefly patient is a 73 year old male with generalized weakness, history of CAD with CTO RCA and distal LAD, chronic angina, CVA, COPD, hypertension, diabetes, BPH, presented to ED with chest pain.  Reported that he woke up from sleep around 7:15 PM in the evening with severe central chest pressure, radiating to his left arm, neck and jaw similar to prior MI with palpitations. In ED, he was found to have atrial fibrillation with RVR and was placed on IV Cardizem drip.  Chest pain improved when he converted to NSR.  No recent illnesses.  BP (!) 153/88   Pulse 65   Temp 98 F (36.7 C)   Resp (!) 25   Ht 5' 10.5" (1.791 m)   Wt 95.7 kg   SpO2 98%   BMI 29.85 kg/m   Physical Exam General: Alert and oriented x 3, NAD Cardiovascular: S1 S2 clear, RRR.  Respiratory: CTAB, no wheezing, rales or rhonchi Gastrointestinal: Soft, nontender, nondistended, NBS Ext: no pedal edema bilaterally Neuro: no new deficits Skin: No rashes Psych: Normal affect   Labs, imaging reviewed  Paroxysmal atrial fibrillation, new onset with severe angina (in the setting of known CAD) -Presented to ED with severe anginal chest pain, A-fib with RVR.  Started on IV Cardizem drip and spontaneously converted to normal sinus rhythm. -Patient has now been transitioned to oral Cardizem, continue Coreg -Started on Eliquis 5 mg twice daily -Cardiology consulted, recommended rhythm control strategy given significant angina and CHA2DS2-VASc Score 7  -Started on amiodarone 400 mg twice daily -2D echo pending  Will follow   Knute Mazzuca M.D.  [facility] 02/16/2024, 1:31 PM

## 2024-02-16 NOTE — TOC Benefit Eligibility Note (Addendum)
 Pharmacy Patient Advocate Encounter  Insurance verification completed.    The patient is insured through  [employer] Part D . Patient has Medicare and is not eligible for a copay card, but may be able to apply for patient assistance or Medicare RX Payment Plan (Patient Must reach out to their plan, if eligible for payment plan), if available.    Ran test claim for Eliquis 5mg  and the current 30 day co-pay is $299.00.($252 applied to deductible, after deductible is met the copay will be $47)    This test claim was processed through [facility]- copay amounts may vary at other pharmacies due to [employer], or as the patient moves through the different stages of their insurance plan.

## 2024-02-16 NOTE — Consult Note (Addendum)
 Cardiology Consultation   Patient ID: Robert Brown MRN: [id number]; DOB: Jan 25, 1951  Admit date: 02/15/2024 Date of Consult: 02/16/2024  PCP:  Billie Lade, MD   [facility] Providers Cardiologist:  Marjo Bicker, MD        Patient Profile:   Robert Brown is a 73 y.o. male with a hx of CAD, Chronic angina, HTN, T2DM, COPD, prior tobacco use, history of CVA who is being seen 02/16/2024 for the evaluation of atrial fibrillation and chest pain at the request of Rai Ripudeep MD.  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  The patient had a left heart cath in 2020 and 2022 for chest pains.  Heart cath found to complete total occlusion of the distal RCA and distal LAD with collateral blood flow.  The LVEF was normal and the patient was managed medically with aggressive antianginal therapy.  Ranexa was stopped due to cost concerns and patient is currently on beta-blocker, calcium channel blocker and Imdur.  Amlodipine was stopped due to leg swelling. The patients ADL's were previously limited by anginal symptoms. Has been followed by Dr Jenene Slicker.   Most recent Echo on 09/2021 at [facility] showed LVEF of 60-65%, Moderate diastolic dysfunction and elevated left atrial pressure with mild dilation, Mild mitral regurgitation, and mild aortic regurgitation.   Robert Brown presented to the emergency department on 02/15/2024 via EMS services for chest pain and palpitations.  Labs in the emergency department showed a potassium of 3.2>3.7, magnesium of 2.0 creatinine of 1.03.  Hemoglobin A1c of 6.6, high-sensitivity troponins trend 5>21. EKG initially showed atrial fibrillation with RVR and a rate of 153. Later EKG showed NSR with a rate of 89. TSH elevated at 6.043 T4 normal at 0.92. BNP was 84.8. Chest x-ray showed no active disease.  On interview patient reported waking up on 7:15 pm with 10/10 chest  pain that radiated to his left arm, jaw and neck. The pain is similar but more severe to prior "heart attacks." The pain did not improve with nitroglycerin or aspirin but did improve about the time he spontaneously converted to NSR. Denies shortness of breath, palpitations, nausea, vomiting, fatigue, and lower extremity edema. Was admitted to the hospital for rhythm control.  Past Medical History:  Diagnosis Date   CAD (coronary artery disease)    Cancer (HCC)    Diabetes mellitus without complication (HCC)    Hypertension    Stroke [facility])     Past Surgical History:  Procedure Laterality Date   bone grafts     TUMOR EXCISION         Inpatient Medications: Scheduled Meds:  apixaban  5 mg Oral BID   carvedilol  25 mg Oral BID   diltiazem  360 mg Oral Daily   doxycycline  50 mg Oral Daily   ezetimibe  10 mg Oral Daily   finasteride  5 mg Oral Daily   fluticasone furoate-vilanterol  1 puff Inhalation Daily   And   umeclidinium bromide  1 puff Inhalation Daily   insulin aspart  0-5 Units Subcutaneous QHS   insulin aspart  0-9 Units Subcutaneous TID WC   isosorbide mononitrate  120 mg Oral Daily   levothyroxine  88 mcg Oral q morning   losartan  100 mg Oral Daily   rosuvastatin  40 mg Oral Daily   sodium chloride flush  3 mL Intravenous Q12H  tamsulosin  0.4 mg Oral BID   Continuous Infusions:  PRN Meds: acetaminophen, busPIRone, melatonin, ondansetron (ZOFRAN) IV, polyethylene glycol  Allergies:    Allergies  Allergen Reactions   Alcohol Other (See Comments)    Prefers not to drink   Lisinopril     Medicines ending in -pril - unknown reaction   Statins     myalgia    Social History:   Social History   Socioeconomic History   Marital status: Married    Spouse name: Not on file   Number of children: Not on file   Years of education: Not on file   Highest education level: Not on file  Occupational History   Not on file  Tobacco Use   Smoking status: Former     Current packs/day: 0.00    Types: Cigarettes    Quit date: 11/17/2006    Years since quitting: 17.2   Smokeless tobacco: Never  Vaping Use   Vaping status: Never Used  Substance and Sexual Activity   Alcohol use: Not Currently    Comment: former drinker   Drug use: Never   Sexual activity: Not Currently  Other Topics Concern   Not on file  Social History Narrative   Not on file   Social Drivers of Health   Financial Resource Strain: High Risk (02/27/2022)   Received from [facility], [facility]   Overall Financial Resource Strain (CARDIA)    Difficulty of Paying Living Expenses: Hard  Food Insecurity: No Food Insecurity (02/16/2024)   Hunger Vital Sign    Worried About Running Out of Food in the Last Year: Never true    Ran Out of Food in the Last Year: Never true  Transportation Needs: No Transportation Needs (02/16/2024)   PRAPARE - Administrator, Civil Service (Medical): No    Lack of Transportation (Non-Medical): No  Physical Activity: Insufficiently Active (02/27/2022)   Received from [facility], [facility]   Exercise Vital Sign    Days of Exercise per Week: 1 day    Minutes of Exercise per Session: 60 min  Stress: Stress Concern Present (02/27/2022)   Received from [facility], [facility] of Occupational [facility] - Occupational Stress Questionnaire    Feeling of Stress : To some extent  Social Connections: Socially Integrated (02/16/2024)   Social Connection and Isolation Panel [NHANES]    Frequency of Communication with Friends and Family: More than three times a week    Frequency of Social Gatherings with Friends and Family: Never    Attends Religious Services: More than 4 times per year    Active Member of [employer] or Organizations: Yes    Attends Engineer, structural: More than 4 times per year    Marital Status: Married  Catering manager Violence: Not At Risk (02/16/2024)   Humiliation, Afraid, Rape, and Kick  questionnaire    Fear of Current or Ex-Partner: No    Emotionally Abused: No    Physically Abused: No    Sexually Abused: No    Family History:    Family History  Problem Relation Age of Onset   Diabetes Mother    Diabetes Father      ROS:  Please see the history of present illness.   All other ROS reviewed and negative.     Physical Exam/Data:   Vitals:   02/16/24 0600 02/16/24 0800 02/16/24 0900 02/16/24 1200  BP:  (!) 143/73 (!) 120/56 (!) 153/88  Pulse:  71 64 65  Resp:  20 14 (!) 25  Temp: 98.2 F (36.8 C)  98 F (36.7 C)   TempSrc: Oral     SpO2:  96% 96% 98%  Weight:      Height:        Intake/Output Summary (Last 24 hours) at 02/16/2024 1222 Last data filed at 02/16/2024 0404 Gross per 24 hour  Intake 87.78 ml  Output --  Net 87.78 ml      02/15/2024   10:09 PM 01/29/2024    2:59 PM 01/21/2024    1:12 PM  Last 3 Weights  Weight (lbs) 211 lb 217 lb 8 oz 211 lb  Weight (kg) 95.709 kg 98.657 kg 95.709 kg     Body mass index is 29.85 kg/m.  General:  Well nourished, well developed, in no acute distress HEENT: normal Neck: no JVD Vascular: No carotid bruits; Distal pulses 2+ bilaterally Cardiac:  normal S1, S2; RRR; no murmur  Lungs:  clear to auscultation bilaterally, no wheezing, rhonchi or rales  Ext: no edema Musculoskeletal:  No deformities. Skin: warm and dry  Neuro:  CNs 2-12 intact, no focal abnormalities noted Psych:  Normal affect   EKG:  The EKG was personally reviewed and demonstrates:  EKG showed NSR with a rate of 89.  ST depression appeared to improve. Telemetry:  Telemetry was personally reviewed and demonstrates:  Atrial fibrillation with RVR that spontaneously converted to sinus rhythm at 2345 on 02/15/24. Had an episode of non-sustained VT for 6 beats  Relevant CV Studies: Echo pending.  Laboratory Data:  High Sensitivity Troponin:   Recent Labs  Lab 02/15/24 2215 02/16/24 0010  TROPONINIHS 5 21*     Chemistry Recent Labs   Lab 02/15/24 2215 02/16/24 0701  NA 140 140  K 3.2* 3.7  CL 105 106  CO2 26 26  GLUCOSE 120* 107*  BUN 21 18  CREATININE 1.18 1.03  CALCIUM 8.6* 8.5*  MG 2.0 2.0  GFRNONAA >60 >60  ANIONGAP 9 8    No results for input(s): "PROT", "ALBUMIN", "AST", "ALT", "ALKPHOS", "BILITOT" in the last 168 hours. Lipids  Recent Labs  Lab 02/11/24 1457  CHOL 214*  TRIG 99  HDL 52  LABVLDL 18  LDLCALC 144*  CHOLHDL 4.1    Hematology Recent Labs  Lab 02/15/24 2215 02/16/24 0701  WBC 8.7 8.2  RBC 4.87 4.91  HGB 13.0 13.1  HCT 41.1 41.3  MCV 84.4 84.1  MCH 26.7 26.7  MCHC 31.6 31.7  RDW 14.2 14.5  PLT 186 162   Thyroid  Recent Labs  Lab 02/15/24 2215  TSH 6.043*  FREET4 0.92    BNP Recent Labs  Lab 02/15/24 2215  BNP 84.8    DDimer No results for input(s): "DDIMER" in the last 168 hours.   Radiology/Studies:  DG Chest Port 1 View Result Date: 02/15/2024 CLINICAL DATA:  AFIB, chest pain EXAM: PORTABLE CHEST 1 VIEW COMPARISON:  Chest x-ray 04/28/2022 FINDINGS: The heart and mediastinal contours are unchanged. No focal consolidation. No pulmonary edema. No pleural effusion. No pneumothorax. No acute osseous abnormality. Bilateral acromioclavicular joint degenerative changes. IMPRESSION: No active disease. Electronically Signed   By: Tish Frederickson M.D.   On: 02/15/2024 22:43     Assessment and Plan:   Giulian Goldring is a 73 y.o. male with a hx of CAD, Chronic angina, HTN, T2DM, COPD, prior tobacco use, history of CVA who is being seen 02/16/2024 for the evaluation of atrial  fibrillation and chest pain at the request of Rai Ripudeep MD  New onset atrial fibrillation CHA2DS2-VASc Score = 7 [CHF History: 1, HTN History: 1, Diabetes History: 1, Stroke History: 2, Vascular Disease History: 1, Age Score: 1, Gender Score: 0].  Therefore, the patient's annual risk of stroke is 11.2 %.     -- Presented to the emergency department with severe angina and atrial fibrillation with  RVR.  Was given IV Cardizem and spontaneously converted to normal sinus rhythm.  Anginal symptoms resolved once in normal sinus rhythm. Patient is a Scientist, product/process development. -- Has sleep apnea and uses CPAP to sleep at night -- TSH was elevated at 6.043 but T4 was normal -- Does not currently use alcohol -- Continue Eliquis 5 mg twice daily -- Continue Coreg 25 mg  twice daily, Cardizem to 360 mg daily -- Echo pending -- Plan for a rhythm control strategy due to significant anginal symptoms with atrial fibrillation. There is no obvious precipitating factor for this episode of atrial fibrillation. -- Start amiodarone 400 mg BID for rhythm control.    Coronary artery disease Chronic angina --Chronic total occlusion on distal RCA and LAD on left heart cath on 2022. -- Presented to the ER via EMS services for severe chest pain that radiated to the left arm and jaw. Troponin trend showed 5>21.  EKG initially showed ST depression in inferior lateral leads that resolved after converting to normal sinus rhythm. Chest pain resolved after the patient spontaneously converted to normal sinus rhythm.  -- I suspect anginal symptoms are secondary to demand ischemia from atrial fibrillation with RVR exacerbating the patient's preexisting chronic angina.  If echo shows no regional wall motion abnormalities no further ischemic evaluation warranted at this time. --Continue aspirin 81 mg daily, Coreg 25 mg  twice daily, Cardizem to 240 mg daily, losartan 100 mg daily, rosuvastatin 40 mg daily, Zetia 10 mg daily, Imdur 120 mg daily.   Chronic diastolic heart failure -- Echo on 2022 showed normal LVEF and moderate diastolic dysfunction with elevated left atrial pressure   Valvular disease -Echo in 2022 showed mild MR and mild AR -Echo pending   Hypertension -- Continue Coreg 25 mg  twice daily, Cardizem to 240 mg daily, losartan 100 mg daily, Imdur 120 mg daily.   Hyperlipidemia -- LDL 144 on 02/16/2024 Goal  <16 -- Continue rosuvastatin 40 mg daily, Zetia 10 mg daily. -- Consider referral to lipid clinic.  T2DM COPD Hypothyroidism -Management per primary  Risk Assessment/Risk Scores:  {    CHA2DS2-VASc Score = 7   This indicates a 11.2% annual risk of stroke. The patient's score is based upon: CHF History: 1 HTN History: 1 Diabetes History: 1 Stroke History: 2 Vascular Disease History: 1 Age Score: 1 Gender Score: 0     For questions or updates, please contact [facility] Please consult [email or web address] for contact info under    Signed, Arabella Merles, PA-C  02/16/2024 12:22 PM   I have personally seen and examined the patient.  My HPI, Exam, and assessment and plan are below, independent of the NPP above.  Mr. Weniger, with coronary artery disease and chronic angina, presents with extreme angina and palpitations in the seetin of PAF  He experienced extreme angina and palpitations, prompting EMS transport. During this episode, he was in atrial fibrillation with rapid ventricular response and was started on diltiazem, which led to spontaneous conversion and resolution of chest pain. Troponin levels increased slightly from 5 to 21, and  initial EKG showed T wave depressions that normalized post-conversion. No obvious precipitating factor was identified.  This is his first episode of atrial fibrillation. He was started on Eliquis due to high stroke risk. His TSH was elevated, but T4 was normal. Prior to this event, he was on Coreg and Cardizem for hypertension and hyperlipidemia.  He has a history of coronary artery disease with occlusion in the RCA and LAD, experiencing chronic angina. He has undergone left heart catheterizations in 2020 and 2022, with the most recent showing stable occlusion to the RCA and LAD. He is on strong medical therapy with Imdur, losartan, and Coreg.  His blood pressure is slightly elevated, but he is not experiencing hypotension. He is currently on Coreg  and losartan for hypertension management.  He is on medication for hyperlipidemia as part of his cardiovascular risk management.  He has a history of obstructive sleep apnea and uses CPAP, which increases his risk for atrial fibrillation.  He has a history of high tobacco use and does not consume alcohol.  Exam notable for  Gen: no distress   Neck: No JVD Cardiac: No Rubs or Gallops, no Murmur, RRR +2 radial pulses Respiratory: Clear to auscultation bilaterally, normal effort, normal  respiratory rate GI: Soft, nontender, non-distended  MS: No  edema;  moves all extremities Integument: Skin feels warm Neuro:  At time of evaluation, alert and oriented to person/place/time/situation  Psych: Normal affect, patient feels ok  Paroxysmal Atrial Fibrillation Presented with extreme angina and palpitations, found in atrial fibrillation with rapid ventricular response (RVR). Spontaneously converted to normal rhythm, resolving chest pain. Troponin levels slightly elevated, T wave depressions noted on EKG during RVR, normalized post-conversion. No prior atrial fibrillation history. Symptomatic, considering rhythm control due to symptom severity. Elevated TSH but normal T4, indicating hypothyroidism, posing risk with amiodarone use. Decision to start oral amiodarone for rhythm control, considering strong symptoms and EKG changes. Potential AV nodal blockade with Coreg, diltiazem, and amiodarone requires monitoring. - Start oral amiodarone for rhythm control. - he has very severe sx; I do not have a clear trigger I recommend oral load tonight and if return to RVR IV amiodarone load - he has a wife who has dementia and they have little support; if he must leave we will do amiodarone 400 mg PO BID and get AF f/u ; can be discharged without seeing me tomorrow if no further AF - discussed at length with patient, Dr. Isidoro Donning, and nursing; care will be complex given his wife's dementia - continue [facility]  Coronary  Artery Disease with Chronic Angina Coronary artery disease with RCA and LAD occlusion, well-managed on left heart catheterizations in 2020 and 2022. Presented with extreme angina, resolved post-conversion from atrial fibrillation. On strong medical therapy with Imdur, losartan, and Coreg. Decision to continue current medications and consider preventive measures for future ischemic events. No further ischemic evaluation needed if echocardiogram is normal. - stop ASA tomorrow if low heart rates stop diltiazem continue other therapy  Hypertension Blood pressure slightly elevated but not hypotensive. On antihypertensive medications including Coreg and losartan.  Hyperlipidemia On medication for hyperlipidemia, part of coronary artery disease management.  Obstructive Sleep Apnea Obstructive sleep apnea, uses CPAP. Increases risk for atrial fibrillation. - noctural CPAP tonight   Riley Lam, MD FASE [facility] Cardiologist [facility]  [address] [place], [address] [place], [place] [address] [phone]  4:51 PM

## 2024-02-16 NOTE — Progress Notes (Signed)
   02/16/24 2109  BiPAP/CPAP/SIPAP  BiPAP/CPAP/SIPAP Pt Type Adult  Reason BIPAP/CPAP not in use Non-compliant  BiPAP/CPAP /SiPAP Vitals  Pulse Rate 73  Resp 18  SpO2 98 %  Bilateral Breath Sounds Clear  MEWS Score/Color  MEWS Score 0  MEWS Score Color [place]

## 2024-02-16 NOTE — ED Notes (Signed)
 ECHO being done at bedside delaying transport to floor

## 2024-02-17 ENCOUNTER — Other Ambulatory Visit (HOSPITAL_COMMUNITY): Payer: Self-pay

## 2024-02-17 DIAGNOSIS — E785 Hyperlipidemia, unspecified: Secondary | ICD-10-CM | POA: Diagnosis not present

## 2024-02-17 DIAGNOSIS — I480 Paroxysmal atrial fibrillation: Secondary | ICD-10-CM | POA: Diagnosis not present

## 2024-02-17 DIAGNOSIS — I4891 Unspecified atrial fibrillation: Secondary | ICD-10-CM | POA: Diagnosis not present

## 2024-02-17 LAB — BASIC METABOLIC PANEL WITH GFR
Anion gap: 8 (ref 5–15)
BUN: 17 mg/dL (ref 8–23)
CO2: 23 mmol/L (ref 22–32)
Calcium: 8.6 mg/dL — ABNORMAL LOW (ref 8.9–10.3)
Chloride: 108 mmol/L (ref 98–111)
Creatinine, Ser: 1.15 mg/dL (ref 0.61–1.24)
GFR, Estimated: >60 mL/min (ref >60)
Glucose, Bld: 114 mg/dL — ABNORMAL HIGH (ref 70–99)
Potassium: 3.6 mmol/L (ref 3.5–5.1)
Sodium: 139 mmol/L (ref 135–145)

## 2024-02-17 LAB — CBC
HCT: 39 % (ref 39.0–52.0)
Hemoglobin: 12.6 g/dL — ABNORMAL LOW (ref 13.0–17.0)
MCH: 26.9 pg (ref 26.0–34.0)
MCHC: 32.3 g/dL (ref 30.0–36.0)
MCV: 83.2 fL (ref 80.0–100.0)
Platelets: 172 10*3/uL (ref 150–400)
RBC: 4.69 MIL/uL (ref 4.22–5.81)
RDW: 14.6 % (ref 11.5–15.5)
WBC: 9.3 10*3/uL (ref 4.0–10.5)
nRBC: 0 % (ref 0.0–0.2)

## 2024-02-17 LAB — GLUCOSE, CAPILLARY: Glucose-Capillary: 162 mg/dL — ABNORMAL HIGH (ref 70–99)

## 2024-02-17 MED ORDER — AMIODARONE HCL 200 MG PO TABS
ORAL_TABLET | ORAL | 0 refills | Status: DC
  Filled 2024-02-17: qty 160, 100d supply, fill #0

## 2024-02-17 MED ORDER — APIXABAN 5 MG PO TABS
5.0000 mg | ORAL_TABLET | Freq: Two times a day (BID) | ORAL | 0 refills | Status: DC
  Filled 2024-02-17: qty 60, 30d supply, fill #0

## 2024-02-17 MED ORDER — AMIODARONE HCL 200 MG PO TABS: 200.0000 mg | ORAL_TABLET | Freq: Every day | ORAL | Status: DC

## 2024-02-17 MED ORDER — EZETIMIBE 10 MG PO TABS
10.0000 mg | ORAL_TABLET | Freq: Every day | ORAL | 0 refills | Status: DC
  Filled 2024-02-17: qty 90, 90d supply, fill #0

## 2024-02-17 NOTE — Plan of Care (Signed)
  Problem: Education: Goal: Ability to describe self-care measures that may prevent or decrease complications (Diabetes Survival Skills Education) will improve Outcome: Adequate for Discharge Goal: Individualized Educational Video(s) Outcome: Adequate for Discharge   Problem: Coping: Goal: Ability to adjust to condition or change in health will improve Outcome: Adequate for Discharge   Problem: Fluid Volume: Goal: Ability to maintain a balanced intake and output will improve Outcome: Adequate for Discharge   Problem: Health Behavior/Discharge Planning: Goal: Ability to identify and utilize available resources and services will improve Outcome: Adequate for Discharge Goal: Ability to manage health-related needs will improve Outcome: Adequate for Discharge   Problem: Metabolic: Goal: Ability to maintain appropriate glucose levels will improve Outcome: Adequate for Discharge   Problem: Nutritional: Goal: Maintenance of adequate nutrition will improve Outcome: Adequate for Discharge Goal: Progress toward achieving an optimal weight will improve Outcome: Adequate for Discharge   Problem: Skin Integrity: Goal: Risk for impaired skin integrity will decrease Outcome: Adequate for Discharge   Problem: Tissue Perfusion: Goal: Adequacy of tissue perfusion will improve Outcome: Adequate for Discharge   Problem: Education: Goal: Knowledge of General Education information will improve Description: Including pain rating scale, medication(s)/side effects and non-pharmacologic comfort measures Outcome: Adequate for Discharge   Problem: Health Behavior/Discharge Planning: Goal: Ability to manage health-related needs will improve Outcome: Adequate for Discharge   Problem: Clinical Measurements: Goal: Ability to maintain clinical measurements within normal limits will improve Outcome: Adequate for Discharge Goal: Will remain free from infection Outcome: Adequate for Discharge Goal:  Diagnostic test results will improve Outcome: Adequate for Discharge Goal: Respiratory complications will improve Outcome: Adequate for Discharge Goal: Cardiovascular complication will be avoided Outcome: Adequate for Discharge   Problem: Activity: Goal: Risk for activity intolerance will decrease Outcome: Adequate for Discharge   Problem: Nutrition: Goal: Adequate nutrition will be maintained Outcome: Adequate for Discharge   Problem: Coping: Goal: Level of anxiety will decrease Outcome: Adequate for Discharge   Problem: Elimination: Goal: Will not experience complications related to bowel motility Outcome: Adequate for Discharge Goal: Will not experience complications related to urinary retention Outcome: Adequate for Discharge   Problem: Pain Managment: Goal: General experience of comfort will improve and/or be controlled Outcome: Adequate for Discharge   Problem: Safety: Goal: Ability to remain free from injury will improve Outcome: Adequate for Discharge   Problem: Skin Integrity: Goal: Risk for impaired skin integrity will decrease Outcome: Adequate for Discharge   Problem: Education: Goal: Knowledge of disease or condition will improve Outcome: Adequate for Discharge Goal: Understanding of medication regimen will improve Outcome: Adequate for Discharge Goal: Individualized Educational Video(s) Outcome: Adequate for Discharge   Problem: Activity: Goal: Ability to tolerate increased activity will improve Outcome: Adequate for Discharge   Problem: Cardiac: Goal: Ability to achieve and maintain adequate cardiopulmonary perfusion will improve Outcome: Adequate for Discharge   Problem: Health Behavior/Discharge Planning: Goal: Ability to safely manage health-related needs after discharge will improve Outcome: Adequate for Discharge

## 2024-02-17 NOTE — Progress Notes (Addendum)
 Progress Note  Patient Name: Robert Brown Date of Encounter: 02/17/2024 Primary Cardiologist: Marjo Bicker, MD   Subjective   Overnight no events No CP, SOB, Palpitations. Eager to leave.  Vital Signs    Vitals:   02/16/24 1620 02/16/24 2002 02/16/24 2109 02/17/24 0427  BP: (!) 149/87 (!) 172/74  139/78  Pulse: 71 69 73 71  Resp: 18 18 18 18   Temp: 97.8 F (36.6 C) 98.2 F (36.8 C)  97.7 F (36.5 C)  TempSrc: Oral Oral  Oral  SpO2: 97% 99% 98% 97%  Weight:      Height:        Intake/Output Summary (Last 24 hours) at 02/17/2024 0751 Last data filed at 02/17/2024 0429 Gross per 24 hour  Intake 255.65 ml  Output 1 ml  Net 254.65 ml   Filed Weights   02/15/24 2209 02/16/24 1420  Weight: 95.7 kg 97.5 kg    Physical Exam   GEN: No acute distress.   Neck: No JVD Cardiac: RRR, no murmurs, rubs, or gallops.  Respiratory: Clear to auscultation bilaterally. GI: Soft, nontender, non-distended  MS: No edema  Labs   Telemetry: SR with 1st degree HB   Chemistry Recent Labs  Lab 02/15/24 2215 02/16/24 0701 02/17/24 0508  NA 140 140 139  K 3.2* 3.7 3.6  CL 105 106 108  CO2 26 26 23   GLUCOSE 120* 107* 114*  BUN 21 18 17   CREATININE 1.18 1.03 1.15  CALCIUM 8.6* 8.5* 8.6*  GFRNONAA >60 >60 >60  ANIONGAP 9 8 8      Hematology Recent Labs  Lab 02/15/24 2215 02/16/24 0701 02/17/24 0508  WBC 8.7 8.2 9.3  RBC 4.87 4.91 4.69  HGB 13.0 13.1 12.6*  HCT 41.1 41.3 39.0  MCV 84.4 84.1 83.2  MCH 26.7 26.7 26.9  MCHC 31.6 31.7 32.3  RDW 14.2 14.5 14.6  PLT 186 162 172    Cardiac EnzymesNo results for input(s): "TROPONINI" in the last 168 hours. No results for input(s): "TROPIPOC" in the last 168 hours.   BNP Recent Labs  Lab 02/15/24 2215  BNP 84.8     DDimer No results for input(s): "DDIMER" in the last 168 hours.   Cardiac Studies   Cardiac Studies & Procedures    ______________________________________________________________________________________________     ECHOCARDIOGRAM  ECHOCARDIOGRAM COMPLETE 02/16/2024  Narrative ECHOCARDIOGRAM REPORT    Patient Name:   Robert Brown Date of Exam: 02/16/2024 Medical Rec #:  [id number]    Height:       70.5 in Accession #:    [id number]   Weight:       211.0 lb Date of Birth:  Sep 26, 1951     BSA:          2.147 m Patient Age:    72 years     BP:           153/88 mmHg Patient Gender: M            HR:           72 bpm. Exam Location:  Inpatient  Procedure: 2D Echo, Cardiac Doppler and Color Doppler (Both Spectral and Color Flow Doppler were utilized during procedure).  Indications:    Atrial Fibrillation  History:        Patient has no prior history of Echocardiogram examinations. CHF, CAD, COPD and Stroke; Risk Factors:Hypertension, Diabetes and Former Smoker.  Sonographer:    Lamont Snowball Referring Phys: [id number] ZANE ADAMS  IMPRESSIONS   1.  Left ventricular ejection fraction, by estimation, is 65 to 70%. The left ventricle has normal function. The left ventricle has no regional wall motion abnormalities. There is moderate concentric left ventricular hypertrophy. Left ventricular diastolic parameters are consistent with Grade I diastolic dysfunction (impaired relaxation). 2. Right ventricular systolic function is normal. The right ventricular size is normal. 3. Left atrial size was mildly dilated. 4. The mitral valve is normal in structure. Trivial mitral valve regurgitation. No evidence of mitral stenosis. 5. The aortic valve is tricuspid. There is mild calcification of the aortic valve. Aortic valve regurgitation is trivial. Aortic valve sclerosis/calcification is present, without any evidence of aortic stenosis. 6. The inferior vena cava is normal in size with greater than 50% respiratory variability, suggesting right atrial pressure of 3 mmHg.  FINDINGS Left Ventricle: Left ventricular  ejection fraction, by estimation, is 65 to 70%. The left ventricle has normal function. The left ventricle has no regional wall motion abnormalities. The left ventricular internal cavity size was normal in size. There is moderate concentric left ventricular hypertrophy. Left ventricular diastolic parameters are consistent with Grade I diastolic dysfunction (impaired relaxation).  Right Ventricle: The right ventricular size is normal. No increase in right ventricular wall thickness. Right ventricular systolic function is normal.  Left Atrium: Left atrial size was mildly dilated.  Right Atrium: Right atrial size was normal in size.  Pericardium: There is no evidence of pericardial effusion.  Mitral Valve: The mitral valve is normal in structure. Trivial mitral valve regurgitation. No evidence of mitral valve stenosis. MV peak gradient, 5.9 mmHg. The mean mitral valve gradient is 4.0 mmHg.  Tricuspid Valve: The tricuspid valve is normal in structure. Tricuspid valve regurgitation is trivial. No evidence of tricuspid stenosis.  Aortic Valve: The aortic valve is tricuspid. There is mild calcification of the aortic valve. Aortic valve regurgitation is trivial. Aortic valve sclerosis/calcification is present, without any evidence of aortic stenosis. Aortic valve mean gradient measures 7.0 mmHg. Aortic valve peak gradient measures 12.8 mmHg. Aortic valve area, by VTI measures 2.43 cm.  Pulmonic Valve: The pulmonic valve was normal in structure. Pulmonic valve regurgitation is not visualized. No evidence of pulmonic stenosis.  Aorta: The aortic root is normal in size and structure.  Venous: The inferior vena cava is normal in size with greater than 50% respiratory variability, suggesting right atrial pressure of 3 mmHg.  IAS/Shunts: No atrial level shunt detected by color flow Doppler.   LEFT VENTRICLE PLAX 2D LVIDd:         4.00 cm   Diastology LVIDs:         2.60 cm   LV e' medial:    6.42  cm/s LV PW:         1.50 cm   LV E/e' medial:  19.6 LV IVS:        1.70 cm   LV e' lateral:   5.22 cm/s LVOT diam:     2.20 cm   LV E/e' lateral: 24.1 LV SV:         87 LV SV Index:   41 LVOT Area:     3.80 cm   RIGHT VENTRICLE             IVC RV Basal diam:  4.70 cm     IVC diam: 2.40 cm RV S prime:     14.70 cm/s TAPSE (M-mode): 2.1 cm  LEFT ATRIUM             Index  RIGHT ATRIUM           Index LA diam:        2.80 cm 1.30 cm/m   RA Area:     19.00 cm LA Vol (A2C):   91.4 ml 42.58 ml/m  RA Volume:   51.60 ml  24.04 ml/m LA Vol (A4C):   59.8 ml 27.86 ml/m LA Biplane Vol: 75.3 ml 35.08 ml/m AORTIC VALVE AV Area (Vmax):    2.48 cm AV Area (Vmean):   2.30 cm AV Area (VTI):     2.43 cm AV Vmax:           179.00 cm/s AV Vmean:          124.000 cm/s AV VTI:            0.358 m AV Peak Grad:      12.8 mmHg AV Mean Grad:      7.0 mmHg LVOT Vmax:         117.00 cm/s LVOT Vmean:        74.900 cm/s LVOT VTI:          0.229 m LVOT/AV VTI ratio: 0.64  AORTA Ao Root diam: 3.40 cm Ao Asc diam:  3.60 cm  MITRAL VALVE MV Area (PHT): 2.93 cm     SHUNTS MV Area VTI:   2.69 cm     Systemic VTI:  0.23 m MV Peak grad:  5.9 mmHg     Systemic Diam: 2.20 cm MV Mean grad:  4.0 mmHg MV Vmax:       1.21 m/s MV Vmean:      90.9 cm/s MV Decel Time: 259 msec MV E velocity: 126.00 cm/s MV A velocity: 108.00 cm/s MV E/A ratio:  1.17  Arvilla Meres MD Electronically signed by Arvilla Meres MD Signature Date/Time: 02/16/2024/2:06:24 PM    Final          ______________________________________________________________________________________________       Assessment & Plan   Paroxysmal Atrial Fibrillation - Elevated CHADSVASC on eliquis; ASA stopped - on high dose PO amiodarone until 03/08/24 given significance of sx Hypothyroidism, no change to meds but needs outpatient tsh 1st HB, stopping diltiazem, continue BB and AAD as above   Coronary Artery Disease  with Chronic Angina - presented with demand ischemia - ASA and AC as above, no change to imdur  HLD with DM on statin and zetia When not dealing with anxiety, well controlled on ARB  Obstructive Sleep Apnea Obstructive sleep apnea, uses CPAP. Increases risk for atrial fibrillation. - noctural CPAP tonight, OK to continue ozempic  We are working on expedited follow up with Dr. Pollyann Savoy and AF clinic  Key med changes: stop ASA, stop diltiazem, start eliquis start start amiodarone  Addendum: Bolded recommendations around ASA for clarification  For questions or updates, please contact [facility] Please consult [email or web address] for contact info under Cardiology/STEMI.      Riley Lam, MD FASE [facility] Cardiologist [facility]  [address] [place], [address] [place], [place] [address] [phone]  7:51 AM

## 2024-02-17 NOTE — Discharge Summary (Signed)
 Physician Discharge Summary  Robert Brown AOZ:[id number] DOB: 1951/02/01  PCP: Billie Lade, MD  Admitted from: Home Discharged to: Home  Admit date: 02/15/2024 Discharge date: 02/17/2024  Recommendations for Outpatient Follow-up:    Follow-up Information     Billie Lade, MD. Schedule an appointment as soon as possible for a visit in 1 week(s).   Specialty: Internal Medicine Why: To be seen with repeat labs (CBC, BMP & TSH). Contact information: [address] [place] [address] [phone]         Marjo Bicker, MD. Schedule an appointment as soon as possible for a visit.   Specialties: Cardiology, Internal Medicine Why: Cardiology office will call with a follow-up appointment. Please call them if you do not hear from them in 3-4 business days. Contact information: [address] [place] [address] [phone]         Alphonzo Severance R, PA Follow up.   Specialty: Cardiology Why: 4/21 at [facility] information: [address] [place] [address] [phone]                  Home Health: None    Equipment/Devices: None    Discharge Condition: Improved and stable   Code Status: Prior Diet recommendation:  Discharge Diet Orders (From admission, onward)     Start     Ordered   02/17/24 0000  Diet - low sodium heart healthy        02/17/24 0923   02/17/24 0000  Diet Carb Modified        02/17/24 0923             Discharge Diagnoses:  Principal Problem:   Atrial fibrillation with rapid ventricular response [facility])   Brief Summary: 73 year old male with generalized weakness, history of CAD with CTO RCA and distal LAD, chronic angina, CVA, COPD, hypertension, diabetes, BPH, presented to ED with chest pain.  Reported that he woke up from sleep around 7:15 PM in the evening with severe central chest pressure, radiating to his left arm, neck and jaw similar to prior MI with palpitations. In ED, he was found to  have atrial fibrillation with RVR and was placed on IV Cardizem drip.  Chest pain improved when he converted to NSR.  No recent illnesses.  Assessment and plan:  Paroxysmal A-fib with RVR Cardiology was consulted and assisted with management Briefly on Cardizem drip and reverted to sinus rhythm. Cardiology has seen him and cleared for DC home and have arranged close outpatient follow-up.  Following meds as per their recommendations: - High-dose amiodarone given significant anginal symptoms on presentation -Continue current home dose of carvedilol, levothyroxine and check TSH as outpatient. -Discontinued aspirin since patient is started on Eliquis/high CHA2DS2-VASc score: 7 -Discontinued diltiazem. 2D echo results as below.  Preserved LVEF.  Coronary artery disease with chronic angina Presented with typical anginal symptoms and demand ischemia from A-fib with RVR Per cardiology, discontinued aspirin since he is on Eliquis anticoagulation. Continue Imdur and carvedilol. Continue statins and Zetia (was not taking it, reordered).  Hypokalemia Replaced  Chronic medical problems: History of CVA: Anticoagulation and antilipid medications as above. COPD: Continue home Trelegy Ellipta equivalent Hypertension: Diltiazem discontinued.  Otherwise continue home Coreg, isosorbide mononitrate, losartan.  Diabetes type 2: Last A1c was within 3 months and not at goal (9.4% in 1/'25).  Will need tighter glycemic control to reduce risk of major adverse cardiac events.  Home regimen is metformin, Ozempic.  SSI while inpatient.  Outpatient follow-up with PCP. Hypothyroidism: Continue home levothyroxine.  TSH elevated but free T4 within normal limit.  Repeat TFT outpatient. BPH: Continue home finasteride, tamsulosin Rosacea: Continue chronic low-dose doxycycline.  Consultations: Cardiology  Procedures: None   Discharge Instructions  Discharge Instructions     Call MD for:  difficulty breathing,  headache or visual disturbances   Complete by: As directed    Call MD for:  extreme fatigue   Complete by: As directed    Call MD for:  persistant dizziness or light-headedness   Complete by: As directed    Call MD for:  persistant nausea and vomiting   Complete by: As directed    Call MD for:  severe uncontrolled pain   Complete by: As directed    Diet - low sodium heart healthy   Complete by: As directed    Diet Carb Modified   Complete by: As directed    Increase activity slowly   Complete by: As directed         Medication List     STOP taking these medications    aspirin EC 81 MG tablet   Dilt-XR 240 MG 24 hr capsule Generic drug: diltiazem   furosemide 20 MG tablet Commonly known as: LASIX   potassium chloride 10 MEQ tablet Commonly known as: KLOR-CON M       TAKE these medications    Accu-Chek Guide w/Device Kit Use to check glucose once daily.   albuterol 108 (90 Base) MCG/ACT inhaler Commonly known as: VENTOLIN HFA Inhale 1-2 puffs into the lungs every 6 (six) hours as needed for shortness of breath or wheezing.   albuterol (2.5 MG/3ML) 0.083% nebulizer solution Commonly known as: PROVENTIL Take 3 mLs (2.5 mg total) by nebulization every 6 (six) hours as needed for wheezing or shortness of breath.   amiodarone 200 MG tablet Commonly known as: PACERONE Take 2 tablets (400 mg total) by mouth 2 (two) times daily for 20 days through 03/08/24, THEN 1 tablet (200 mg total) daily from 03/09/24. Start taking on: February 17, 2024   busPIRone 5 MG tablet Commonly known as: BUSPAR Take 5 mg by mouth daily as needed (for anxiety).   carvedilol 25 MG tablet Commonly known as: COREG Take 25 mg by mouth 2 (two) times daily.   doxycycline 50 MG capsule Commonly known as: VIBRAMYCIN Take 50 mg by mouth daily.   Eliquis 5 MG Tabs tablet Generic drug: apixaban Take 1 tablet (5 mg total) by mouth 2 (two) times daily.   ezetimibe 10 MG tablet Commonly known  as: ZETIA Take 1 tablet (10 mg total) by mouth daily.   finasteride 5 MG tablet Commonly known as: PROSCAR Take 5 mg by mouth daily.   FreeStyle Libre 3 Plus Sensor Misc Change sensor every 15 days.   isosorbide mononitrate 120 MG 24 hr tablet Commonly known as: IMDUR Take 1 tablet (120 mg total) by mouth daily.   levothyroxine 88 MCG tablet Commonly known as: SYNTHROID Take 1 tablet (88 mcg total) by mouth every morning.   losartan 100 MG tablet Commonly known as: COZAAR Take 100 mg by mouth daily.   metFORMIN 500 MG tablet Commonly known as: GLUCOPHAGE Take 1 tablet (500 mg total) by mouth 2 (two) times daily.   nitroGLYCERIN 0.4 MG SL tablet Commonly known as: NITROSTAT Place 1 tablet (0.4 mg total) under the tongue every 5 (five) minutes as needed for chest pain. If a single episode of chest  pain is not relieved by one tablet, the patient will try another within 5 minutes; and if this doesn't relieve the pain, the patient is instructed to call 911 for transportation to an emergency department.   Ozempic (1 MG/DOSE) 2 MG/1.5ML Sopn Generic drug: Semaglutide (1 MG/DOSE) Inject 1 mg into the skin every Tuesday.   rosuvastatin 40 MG tablet Commonly known as: CRESTOR Take 1 tablet (40 mg total) by mouth daily.   tamsulosin 0.4 MG Caps capsule Commonly known as: FLOMAX Take 0.4 mg by mouth 2 (two) times daily.   Trelegy Ellipta 100-62.5-25 MCG/ACT Aepb Generic drug: Fluticasone-Umeclidin-Vilant One click each am What changed:  how much to take how to take this when to take this reasons to take this additional instructions       Allergies  Allergen Reactions   Alcohol Other (See Comments)    Prefers not to drink   Lisinopril     Medicines ending in -pril - unknown reaction   Statins     myalgia      Procedures/Studies: ECHOCARDIOGRAM COMPLETE Result Date: 02/16/2024    ECHOCARDIOGRAM REPORT   Patient Name:   Robert Brown Date of Exam: 02/16/2024 Medical  Rec #:  [id number]    Height:       70.5 in Accession #:    [id number]   Weight:       211.0 lb Date of Birth:  13-Oct-1951     BSA:          2.147 m Patient Age:    72 years     BP:           153/88 mmHg Patient Gender: M            HR:           72 bpm. Exam Location:  Inpatient Procedure: 2D Echo, Cardiac Doppler and Color Doppler (Both Spectral and Color            Flow Doppler were utilized during procedure). Indications:    Atrial Fibrillation  History:        Patient has no prior history of Echocardiogram examinations.                 CHF, CAD, COPD and Stroke; Risk Factors:Hypertension, Diabetes                 and Former Smoker.  Sonographer:    Lamont Snowball Referring Phys: [id number] ZANE ADAMS IMPRESSIONS  1. Left ventricular ejection fraction, by estimation, is 65 to 70%. The left ventricle has normal function. The left ventricle has no regional wall motion abnormalities. There is moderate concentric left ventricular hypertrophy. Left ventricular diastolic parameters are consistent with Grade I diastolic dysfunction (impaired relaxation).  2. Right ventricular systolic function is normal. The right ventricular size is normal.  3. Left atrial size was mildly dilated.  4. The mitral valve is normal in structure. Trivial mitral valve regurgitation. No evidence of mitral stenosis.  5. The aortic valve is tricuspid. There is mild calcification of the aortic valve. Aortic valve regurgitation is trivial. Aortic valve sclerosis/calcification is present, without any evidence of aortic stenosis.  6. The inferior vena cava is normal in size with greater than 50% respiratory variability, suggesting right atrial pressure of 3 mmHg. FINDINGS  Left Ventricle: Left ventricular ejection fraction, by estimation, is 65 to 70%. The left ventricle has normal function. The left ventricle has no regional wall motion abnormalities. The left ventricular internal cavity size was normal  in size. There is  moderate concentric left  ventricular hypertrophy. Left ventricular diastolic parameters are consistent with Grade I diastolic dysfunction (impaired relaxation). Right Ventricle: The right ventricular size is normal. No increase in right ventricular wall thickness. Right ventricular systolic function is normal. Left Atrium: Left atrial size was mildly dilated. Right Atrium: Right atrial size was normal in size. Pericardium: There is no evidence of pericardial effusion. Mitral Valve: The mitral valve is normal in structure. Trivial mitral valve regurgitation. No evidence of mitral valve stenosis. MV peak gradient, 5.9 mmHg. The mean mitral valve gradient is 4.0 mmHg. Tricuspid Valve: The tricuspid valve is normal in structure. Tricuspid valve regurgitation is trivial. No evidence of tricuspid stenosis. Aortic Valve: The aortic valve is tricuspid. There is mild calcification of the aortic valve. Aortic valve regurgitation is trivial. Aortic valve sclerosis/calcification is present, without any evidence of aortic stenosis. Aortic valve mean gradient measures 7.0 mmHg. Aortic valve peak gradient measures 12.8 mmHg. Aortic valve area, by VTI measures 2.43 cm. Pulmonic Valve: The pulmonic valve was normal in structure. Pulmonic valve regurgitation is not visualized. No evidence of pulmonic stenosis. Aorta: The aortic root is normal in size and structure. Venous: The inferior vena cava is normal in size with greater than 50% respiratory variability, suggesting right atrial pressure of 3 mmHg. IAS/Shunts: No atrial level shunt detected by color flow Doppler.  LEFT VENTRICLE PLAX 2D LVIDd:         4.00 cm   Diastology LVIDs:         2.60 cm   LV e' medial:    6.42 cm/s LV PW:         1.50 cm   LV E/e' medial:  19.6 LV IVS:        1.70 cm   LV e' lateral:   5.22 cm/s LVOT diam:     2.20 cm   LV E/e' lateral: 24.1 LV SV:         87 LV SV Index:   41 LVOT Area:     3.80 cm  RIGHT VENTRICLE             IVC RV Basal diam:  4.70 cm     IVC diam: 2.40 cm  RV S prime:     14.70 cm/s TAPSE (M-mode): 2.1 cm LEFT ATRIUM             Index        RIGHT ATRIUM           Index LA diam:        2.80 cm 1.30 cm/m   RA Area:     19.00 cm LA Vol (A2C):   91.4 ml 42.58 ml/m  RA Volume:   51.60 ml  24.04 ml/m LA Vol (A4C):   59.8 ml 27.86 ml/m LA Biplane Vol: 75.3 ml 35.08 ml/m  AORTIC VALVE AV Area (Vmax):    2.48 cm AV Area (Vmean):   2.30 cm AV Area (VTI):     2.43 cm AV Vmax:           179.00 cm/s AV Vmean:          124.000 cm/s AV VTI:            0.358 m AV Peak Grad:      12.8 mmHg AV Mean Grad:      7.0 mmHg LVOT Vmax:         117.00 cm/s LVOT Vmean:  74.900 cm/s LVOT VTI:          0.229 m LVOT/AV VTI ratio: 0.64  AORTA Ao Root diam: 3.40 cm Ao Asc diam:  3.60 cm MITRAL VALVE MV Area (PHT): 2.93 cm     SHUNTS MV Area VTI:   2.69 cm     Systemic VTI:  0.23 m MV Peak grad:  5.9 mmHg     Systemic Diam: 2.20 cm MV Mean grad:  4.0 mmHg MV Vmax:       1.21 m/s MV Vmean:      90.9 cm/s MV Decel Time: 259 msec MV E velocity: 126.00 cm/s MV A velocity: 108.00 cm/s MV E/A ratio:  1.17 Arvilla Meres MD Electronically signed by Arvilla Meres MD Signature Date/Time: 02/16/2024/2:06:24 PM    Final    DG Chest Port 1 View Result Date: 02/15/2024 CLINICAL DATA:  AFIB, chest pain EXAM: PORTABLE CHEST 1 VIEW COMPARISON:  Chest x-ray 04/28/2022 FINDINGS: The heart and mediastinal contours are unchanged. No focal consolidation. No pulmonary edema. No pleural effusion. No pneumothorax. No acute osseous abnormality. Bilateral acromioclavicular joint degenerative changes. IMPRESSION: No active disease. Electronically Signed   By: Tish Frederickson M.D.   On: 02/15/2024 22:43      Subjective: Seen this morning.  Patient anxious to DC home quickly to take care of his wife with advanced dementia.  Reportedly had caregivers overnight taking care of her.  States that on route to the ED via ambulance, his chest pain symptoms improved or resolved and has not had recurrence  since hospital admission.  Denies any other complaints.  Discharge Exam:  Vitals:   02/16/24 1620 02/16/24 2002 02/16/24 2109 02/17/24 0427  BP: (!) 149/87 (!) 172/74  139/78  Pulse: 71 69 73 71  Resp: 18 18 18 18   Temp: 97.8 F (36.6 C) 98.2 F (36.8 C)  97.7 F (36.5 C)  TempSrc: Oral Oral  Oral  SpO2: 97% 99% 98% 97%  Weight:      Height:        General: Elderly male, moderately built and nourished lying comfortably supine in bed without distress. Cardiovascular: S1 & S2 heard, RRR, S1/S2 +. No murmurs, rubs, gallops or clicks. No JVD or pedal edema.  Telemetry personally reviewed: Sinus rhythm. Respiratory: Clear to auscultation without wheezing, rhonchi or crackles. No increased work of breathing. Abdominal:  Non distended, non tender & soft. No organomegaly or masses appreciated. Normal bowel sounds heard. CNS: Alert and oriented. No focal deficits. Extremities: no edema, no cyanosis    The results of significant diagnostics from this hospitalization (including imaging, microbiology, ancillary and laboratory) are listed below for reference.     Microbiology: No results found for this or any previous visit (from the past 240 hours).   Labs: CBC: Recent Labs  Lab 02/15/24 2215 02/16/24 0701 02/17/24 0508  WBC 8.7 8.2 9.3  NEUTROABS 5.4  --   --   HGB 13.0 13.1 12.6*  HCT 41.1 41.3 39.0  MCV 84.4 84.1 83.2  PLT 186 162 172    Basic Metabolic Panel: Recent Labs  Lab 02/15/24 2215 02/16/24 0701 02/17/24 0508  NA 140 140 139  K 3.2* 3.7 3.6  CL 105 106 108  CO2 26 26 23   GLUCOSE 120* 107* 114*  BUN 21 18 17   CREATININE 1.18 1.03 1.15  CALCIUM 8.6* 8.5* 8.6*  MG 2.0 2.0  --   PHOS  --  3.1  --     Liver Function Tests:  No results for input(s): "AST", "ALT", "ALKPHOS", "BILITOT", "PROT", "ALBUMIN" in the last 168 hours.  CBG: Recent Labs  Lab 02/16/24 0732 02/16/24 1145 02/16/24 1616 02/16/24 2025 02/17/24 0753  GLUCAP 75 124* 149* 109* 162*     Hgb A1c Recent Labs    02/16/24 0701  HGBA1C 6.6*     Thyroid function studies Recent Labs    02/15/24 2215  TSH 6.043*     Time coordinating discharge: 35 minutes  SIGNED:  Marcellus Scott, MD,  FACP, [facility], [facility], [facility]   [facility] [place]     To contact the attending provider between 7A-7P or the covering provider during after hours 7P-7A, please log into the web site [email or web address] and access using universal [place] password for that web site. If you do not have the password, please call the hospital operator.

## 2024-02-17 NOTE — Plan of Care (Signed)
   Problem: Education: Goal: Ability to describe self-care measures that may prevent or decrease complications (Diabetes Survival Skills Education) will improve Outcome: Progressing Goal: Individualized Educational Video(s) Outcome: Progressing   Problem: Coping: Goal: Ability to adjust to condition or change in health will improve Outcome: Progressing   Problem: Fluid Volume: Goal: Ability to maintain a balanced intake and output will improve Outcome: Progressing   Problem: Health Behavior/Discharge Planning: Goal: Ability to identify and utilize available resources and services will improve Outcome: Progressing Goal: Ability to manage health-related needs will improve Outcome: Progressing   Problem: Metabolic: Goal: Ability to maintain appropriate glucose levels will improve Outcome: Progressing   Problem: Nutritional: Goal: Maintenance of adequate nutrition will improve Outcome: Progressing Goal: Progress toward achieving an optimal weight will improve Outcome: Progressing   Problem: Skin Integrity: Goal: Risk for impaired skin integrity will decrease Outcome: Progressing   Problem: Tissue Perfusion: Goal: Adequacy of tissue perfusion will improve Outcome: Progressing   Problem: Education: Goal: Knowledge of General Education information will improve Description: Including pain rating scale, medication(s)/side effects and non-pharmacologic comfort measures Outcome: Progressing   Problem: Health Behavior/Discharge Planning: Goal: Ability to manage health-related needs will improve Outcome: Progressing   Problem: Clinical Measurements: Goal: Ability to maintain clinical measurements within normal limits will improve Outcome: Progressing Goal: Will remain free from infection Outcome: Progressing Goal: Diagnostic test results will improve Outcome: Progressing Goal: Respiratory complications will improve Outcome: Progressing Goal: Cardiovascular complication will  be avoided Outcome: Progressing   Problem: Activity: Goal: Risk for activity intolerance will decrease Outcome: Progressing   Problem: Nutrition: Goal: Adequate nutrition will be maintained Outcome: Progressing   Problem: Coping: Goal: Level of anxiety will decrease Outcome: Progressing   Problem: Elimination: Goal: Will not experience complications related to bowel motility Outcome: Progressing Goal: Will not experience complications related to urinary retention Outcome: Progressing   Problem: Pain Managment: Goal: General experience of comfort will improve and/or be controlled Outcome: Progressing   Problem: Safety: Goal: Ability to remain free from injury will improve Outcome: Progressing   Problem: Skin Integrity: Goal: Risk for impaired skin integrity will decrease Outcome: Progressing   Problem: Education: Goal: Knowledge of disease or condition will improve Outcome: Progressing Goal: Understanding of medication regimen will improve Outcome: Progressing Goal: Individualized Educational Video(s) Outcome: Progressing   Problem: Activity: Goal: Ability to tolerate increased activity will improve Outcome: Progressing   Problem: Cardiac: Goal: Ability to achieve and maintain adequate cardiopulmonary perfusion will improve Outcome: Progressing   Problem: Health Behavior/Discharge Planning: Goal: Ability to safely manage health-related needs after discharge will improve Outcome: Progressing

## 2024-02-17 NOTE — Care Management Obs Status (Signed)
 MEDICARE OBSERVATION STATUS NOTIFICATION   Patient Details  Name: Robert Brown MRN: [id number] Date of Birth: [birth year]   Medicare Observation Status Notification Given:  Yes    Gala Lewandowsky, RN 02/17/2024, 9:10 AM

## 2024-02-17 NOTE — Discharge Instructions (Signed)

## 2024-02-24 MED ORDER — LOSARTAN POTASSIUM 100 MG PO TABS: 100.0000 mg | ORAL_TABLET | Freq: Every day | ORAL | 3 refills | Status: DC

## 2024-02-26 ENCOUNTER — Ambulatory Visit (INDEPENDENT_AMBULATORY_CARE_PROVIDER_SITE_OTHER): Admitting: Internal Medicine

## 2024-02-26 ENCOUNTER — Encounter: Payer: Self-pay | Admitting: Internal Medicine

## 2024-02-26 VITALS — BP 142/70 | HR 73 | Ht 70.5 in | Wt 219.2 lb

## 2024-02-26 DIAGNOSIS — I4891 Unspecified atrial fibrillation: Secondary | ICD-10-CM

## 2024-02-26 DIAGNOSIS — R110 Nausea: Secondary | ICD-10-CM

## 2024-02-26 DIAGNOSIS — I10 Essential (primary) hypertension: Secondary | ICD-10-CM | POA: Diagnosis not present

## 2024-02-26 MED ORDER — ONDANSETRON 4 MG PO TBDP: 4.0000 mg | ORAL_TABLET | Freq: Three times a day (TID) | ORAL | 0 refills | Status: AC | PRN

## 2024-02-26 NOTE — Progress Notes (Signed)
 Established Patient Office Visit  Subjective   Patient ID: Emilian Stawicki, male    DOB: 19-Dec-1950  Age: 73 y.o. MRN: [id number]  Chief Complaint  Patient presents with   Hospitalization Follow-up    Hospital Follow up    Mr. Kessner presents today for hospital follow-up.  Recent hospital admission 3/31 - 4/2 in the setting of new onset A-fib with RVR.  He presented 3/31 endorsing weakness and central chest pressure with radiation to the left arm, neck, and jaw.  On ED presentation he was found to have A-fib with RVR, placed on IV diltiazem drip and subsequently converted to normal sinus rhythm.  He was evaluated by cardiology, who recommended starting amiodarone given anginal symptoms on presentation.  Aspirin was discontinued in favor of Eliquis (CHA2DS2-VASc score of 7).  Updated TTE was reassuring.  Close outpatient PCP and cardiology follow-up arranged.  There have been no acute interval events since hospital discharge. Mr. Carroll reports feeling fairly well today.  He endorses significant nausea and requesting medication for as needed nausea relief.  He would like to review his recent clinical course for better understanding of what atrial fibrillation is.  He has been taking amiodarone and Eliquis as prescribed.  Past Medical History:  Diagnosis Date   CAD (coronary artery disease)    Cancer (HCC)    Diabetes mellitus without complication (HCC)    Hypertension    Stroke [facility])    Past Surgical History:  Procedure Laterality Date   bone grafts     TUMOR EXCISION     Social History   Tobacco Use   Smoking status: Former    Current packs/day: 0.00    Types: Cigarettes    Quit date: 11/17/2006    Years since quitting: 17.2   Smokeless tobacco: Never  Vaping Use   Vaping status: Never Used  Substance Use Topics   Alcohol use: Not Currently    Comment: former drinker   Drug use: Never   Family History  Problem Relation Age of Onset   Diabetes Mother    Diabetes Father     Allergies  Allergen Reactions   Alcohol Other (See Comments)    Prefers not to drink   Lisinopril     Medicines ending in -pril - unknown reaction   Statins     myalgia   Review of Systems  Constitutional:  Negative for chills and fever.  HENT:  Negative for sore throat.   Respiratory:  Negative for cough and shortness of breath.   Cardiovascular:  Negative for chest pain, palpitations and leg swelling.  Gastrointestinal:  Positive for nausea. Negative for abdominal pain, blood in stool, constipation, diarrhea and vomiting.  Genitourinary:  Negative for dysuria and hematuria.  Musculoskeletal:  Negative for myalgias.  Skin:  Negative for itching and rash.  Neurological:  Negative for dizziness and headaches.  Psychiatric/Behavioral:  Negative for depression and suicidal ideas.      Objective:     BP (!) 142/70   Pulse 73   Ht 5' 10.5" (1.791 m)   Wt 219 lb 3.2 oz (99.4 kg)   SpO2 91%   BMI 31.01 kg/m  BP Readings from Last 3 Encounters:  02/26/24 (!) 142/70  02/17/24 139/78  01/29/24 (!) 158/78   Physical Exam Vitals reviewed.  Constitutional:      General: He is not in acute distress.    Appearance: Normal appearance. He is obese. He is not ill-appearing.  HENT:  Head: Normocephalic and atraumatic.     Right Ear: External ear normal.     Left Ear: External ear normal.     Nose: Nose normal. No congestion or rhinorrhea.     Mouth/Throat:     Mouth: Mucous membranes are moist.     Pharynx: Oropharynx is clear.  Eyes:     General: No scleral icterus.    Extraocular Movements: Extraocular movements intact.     Conjunctiva/sclera: Conjunctivae normal.     Pupils: Pupils are equal, round, and reactive to light.  Cardiovascular:     Rate and Rhythm: Normal rate and regular rhythm.     Pulses: Normal pulses.     Heart sounds: Normal heart sounds. No murmur heard. Pulmonary:     Effort: Pulmonary effort is normal.     Breath sounds: Normal breath sounds. No  wheezing, rhonchi or rales.  Abdominal:     General: Abdomen is flat. Bowel sounds are normal. There is no distension.     Palpations: Abdomen is soft.     Tenderness: There is no abdominal tenderness.  Musculoskeletal:        General: No swelling or deformity. Normal range of motion.     Cervical back: Normal range of motion.  Skin:    General: Skin is warm and dry.     Capillary Refill: Capillary refill takes less than 2 seconds.  Neurological:     General: No focal deficit present.     Mental Status: He is alert and oriented to person, place, and time.     Motor: No weakness.  Psychiatric:        Mood and Affect: Mood normal.        Behavior: Behavior normal.        Thought Content: Thought content normal.   Last CBC Lab Results  Component Value Date   WBC 9.3 02/17/2024   HGB 12.6 (L) 02/17/2024   HCT 39.0 02/17/2024   MCV 83.2 02/17/2024   MCH 26.9 02/17/2024   RDW 14.6 02/17/2024   PLT 172 02/17/2024   Last metabolic panel Lab Results  Component Value Date   GLUCOSE 114 (H) 02/17/2024   NA 139 02/17/2024   K 3.6 02/17/2024   CL 108 02/17/2024   CO2 23 02/17/2024   BUN 17 02/17/2024   CREATININE 1.15 02/17/2024   GFRNONAA >60 02/17/2024   CALCIUM 8.6 (L) 02/17/2024   PHOS 3.1 02/16/2024   PROT 6.9 08/24/2023   ALBUMIN 4.3 08/24/2023   LABGLOB 2.6 08/24/2023   BILITOT 0.2 08/24/2023   ALKPHOS 125 (H) 08/24/2023   AST 21 08/24/2023   ALT 26 08/24/2023   ANIONGAP 8 02/17/2024   Last lipids Lab Results  Component Value Date   CHOL 214 (H) 02/11/2024   HDL 52 02/11/2024   LDLCALC 144 (H) 02/11/2024   TRIG 99 02/11/2024   CHOLHDL 4.1 02/11/2024   Last hemoglobin A1c Lab Results  Component Value Date   HGBA1C 6.6 (H) 02/16/2024   Last thyroid functions Lab Results  Component Value Date   TSH 6.043 (H) 02/15/2024   Last vitamin D Lab Results  Component Value Date   VD25OH 34.0 08/24/2023     Assessment & Plan:   Problem List Items Addressed  This Visit       HTN (hypertension) (Chronic)   BP remains mildly elevated today.  He is currently prescribed losartan 100 mg daily, Imdur 120 mg daily, and carvedilol 25 mg twice daily.  Diltiazem was  discontinued upon hospital discharge.  No medication changes were made today.  Cardiology follow-up scheduled for next week.      Atrial fibrillation with rapid ventricular response (HCC) - Primary   Presenting today for hospital follow-up in the setting of recent admission 3/31 - 4/2 for new onset A-fib with RVR.  Clinical course as described above.  Discharged 4/2 on amiodarone and Eliquis.  He has been taking both medications as prescribed.  Regular rate and rhythm detected on exam today.  Currently endorsing significant nausea.  Will add ondansetron for as needed nausea relief.  Repeat CBC, BMP, and TSH pending.  Cardiology follow-up scheduled for 4/15.  HF clinic appointment 4/21.  He will return to care for previously scheduled follow-up on 6/11.      Return in about 2 months (around 04/27/2024).   Billie Lade, MD

## 2024-02-26 NOTE — Assessment & Plan Note (Signed)
 Presenting today for hospital follow-up in the setting of recent admission 3/31 - 4/2 for new onset A-fib with RVR.  Clinical course as described above.  Discharged 4/2 on amiodarone and Eliquis.  He has been taking both medications as prescribed.  Regular rate and rhythm detected on exam today.  Currently endorsing significant nausea.  Will add ondansetron for as needed nausea relief.  Repeat CBC, BMP, and TSH pending.  Cardiology follow-up scheduled for 4/15.  HF clinic appointment 4/21.  He will return to care for previously scheduled follow-up on 6/11.

## 2024-02-26 NOTE — Patient Instructions (Signed)
 It was a pleasure to see you today.  Thank you for giving [place] the opportunity to be involved in your care.  Below is a brief recap of your visit and next steps.  We will plan to see you again in June.  [facility] follow [facility] today Continue medications as currently prescribed Zofran added for nausea relief Repeat labs Follow up as scheduled on 6/11

## 2024-02-26 NOTE — Assessment & Plan Note (Signed)
 BP remains mildly elevated today.  He is currently prescribed losartan 100 mg daily, Imdur 120 mg daily, and carvedilol 25 mg twice daily.  Diltiazem was discontinued upon hospital discharge.  No medication changes were made today.  Cardiology follow-up scheduled for next week.

## 2024-02-27 LAB — BASIC METABOLIC PANEL WITH GFR
BUN/Creatinine Ratio: 11 (ref 10–24)
BUN: 15 mg/dL (ref 8–27)
CO2: 25 mmol/L (ref 20–29)
Calcium: 9.2 mg/dL (ref 8.6–10.2)
Chloride: 101 mmol/L (ref 96–106)
Creatinine, Ser: 1.32 mg/dL — ABNORMAL HIGH (ref 0.76–1.27)
Glucose: 133 mg/dL — ABNORMAL HIGH (ref 70–99)
Potassium: 3.9 mmol/L (ref 3.5–5.2)
Sodium: 143 mmol/L (ref 134–144)
eGFR: 57 mL/min/{1.73_m2} — ABNORMAL LOW (ref >59)

## 2024-02-27 LAB — CBC WITH DIFFERENTIAL/PLATELET
Basophils Absolute: 0 10*3/uL (ref 0.0–0.2)
Basos: 0 %
EOS (ABSOLUTE): 0.2 10*3/uL (ref 0.0–0.4)
Eos: 2 %
Hematocrit: 41 % (ref 37.5–51.0)
Hemoglobin: 13 g/dL (ref 13.0–17.7)
Immature Grans (Abs): 0 10*3/uL (ref 0.0–0.1)
Immature Granulocytes: 0 %
Lymphocytes Absolute: 1.5 10*3/uL (ref 0.7–3.1)
Lymphs: 17 %
MCH: 26.7 pg (ref 26.6–33.0)
MCHC: 31.7 g/dL (ref 31.5–35.7)
MCV: 84 fL (ref 79–97)
Monocytes Absolute: 0.8 10*3/uL (ref 0.1–0.9)
Monocytes: 8 %
Neutrophils Absolute: 6.7 10*3/uL (ref 1.4–7.0)
Neutrophils: 73 %
Platelets: 156 10*3/uL (ref 150–450)
RBC: 4.86 x10E6/uL (ref 4.14–5.80)
RDW: 13.3 % (ref 11.6–15.4)
WBC: 9.2 10*3/uL (ref 3.4–10.8)

## 2024-02-27 LAB — TSH+FREE T4
Free T4: 1.62 ng/dL (ref 0.82–1.77)
TSH: 3.56 u[IU]/mL (ref 0.450–4.500)

## 2024-02-29 ENCOUNTER — Ambulatory Visit (HOSPITAL_COMMUNITY)

## 2024-02-29 ENCOUNTER — Encounter: Payer: Self-pay | Admitting: Internal Medicine

## 2024-03-01 ENCOUNTER — Encounter (HOSPITAL_BASED_OUTPATIENT_CLINIC_OR_DEPARTMENT_OTHER): Payer: Self-pay | Admitting: Family

## 2024-03-01 ENCOUNTER — Ambulatory Visit (HOSPITAL_BASED_OUTPATIENT_CLINIC_OR_DEPARTMENT_OTHER): Admitting: Family

## 2024-03-01 VITALS — BP 168/92 | HR 71 | Ht 70.0 in | Wt 224.0 lb

## 2024-03-01 DIAGNOSIS — Z8673 Personal history of transient ischemic attack (TIA), and cerebral infarction without residual deficits: Secondary | ICD-10-CM

## 2024-03-01 DIAGNOSIS — T466X5A Adverse effect of antihyperlipidemic and antiarteriosclerotic drugs, initial encounter: Secondary | ICD-10-CM | POA: Diagnosis not present

## 2024-03-01 DIAGNOSIS — G720 Drug-induced myopathy: Secondary | ICD-10-CM

## 2024-03-01 DIAGNOSIS — I25118 Atherosclerotic heart disease of native coronary artery with other forms of angina pectoris: Secondary | ICD-10-CM

## 2024-03-01 DIAGNOSIS — D6859 Other primary thrombophilia: Secondary | ICD-10-CM | POA: Diagnosis not present

## 2024-03-01 DIAGNOSIS — I480 Paroxysmal atrial fibrillation: Secondary | ICD-10-CM | POA: Diagnosis not present

## 2024-03-01 DIAGNOSIS — I5032 Chronic diastolic (congestive) heart failure: Secondary | ICD-10-CM

## 2024-03-01 DIAGNOSIS — Z79899 Other long term (current) drug therapy: Secondary | ICD-10-CM

## 2024-03-01 DIAGNOSIS — E7801 Familial hypercholesterolemia: Secondary | ICD-10-CM

## 2024-03-01 MED ORDER — VALSARTAN 160 MG PO TABS: 160.0000 mg | ORAL_TABLET | Freq: Every day | ORAL | 11 refills | Status: DC

## 2024-03-01 MED ORDER — AMIODARONE HCL 200 MG PO TABS: 200.0000 mg | ORAL_TABLET | Freq: Every day | ORAL | 11 refills | Status: DC

## 2024-03-01 NOTE — Progress Notes (Signed)
 Cardiology Office Note:  .   Date:  03/01/2024  ID:  Robert Brown, DOB 1951/11/08, MRN [id number] PCP: Billie Lade, MD  [facility] Providers Cardiologist:  Marjo Bicker, MD    History of Present Illness: .   Robert Brown is a 73 y.o. male with a history of CAD with CTO of RCA and distal LAD, HFpEF, CVA, COPD, hypertension, diabetes, BPH.  LHC 2020 and 2022 for worsening chest pain with CTO of distal RCA and CTO of LAD with collateralization.  Recommend for medical management.  Ranexa was cost Prepidil.  Did not tolerate amlodipine due to leg swelling.  Hospitalized 3/31-02/17/2024 after presenting with chest pain found to have atrial fibrillation with RVR.  Placed on IV diltiazem drip.  Returned to NSR. Started on high dose Amiodarone 400mg  BID until 03/08/24 given significant anginal symptoms on presentation. Diltiazem stopped due to first degree heart block. Aspirin stopped, Eliquis initiated. Encouraged to utilize CPAP.   Presents today for follow-up independently.  Since discharge notes no recurrent chest pain, palpitations.  He does note persistent nausea which is only minimally improved with p.o. Zofran provided by his PCP on 02/26/2024. Often in the evening feels he needs to have a bowel movement but is unable to have BM til the morning. Bowel movements have been both loose and firm. Also notes poor "get up and go". BP at home has been elevated 181/90s this morning prior to medications. BP checked with wrist cuff at home which was checked for accuracy a couple of years ago, encouraged to bring up to upcoming follow-up to reassess accuracy. Heart rate at home routinely 60s.   ROS: Please see the history of present illness.    All other systems reviewed and are negative.   Studies Reviewed: .        Cardiac Studies & Procedures   ______________________________________________________________________________________________      ECHOCARDIOGRAM  ECHOCARDIOGRAM COMPLETE 02/16/2024  Narrative ECHOCARDIOGRAM REPORT    Patient Name:   Robert Brown Date of Exam: 02/16/2024 Medical Rec #:  [id number]    Height:       70.5 in Accession #:    [id number]   Weight:       211.0 lb Date of Birth:  1951-02-16     BSA:          2.147 m Patient Age:    72 years     BP:           153/88 mmHg Patient Gender: M            HR:           72 bpm. Exam Location:  Inpatient  Procedure: 2D Echo, Cardiac Doppler and Color Doppler (Both Spectral and Color Flow Doppler were utilized during procedure).  Indications:    Atrial Fibrillation  History:        Patient has no prior history of Echocardiogram examinations. CHF, CAD, COPD and Stroke; Risk Factors:Hypertension, Diabetes and Former Smoker.  Sonographer:    Lamont Snowball Referring Phys: [id number] ZANE ADAMS  IMPRESSIONS   1. Left ventricular ejection fraction, by estimation, is 65 to 70%. The left ventricle has normal function. The left ventricle has no regional wall motion abnormalities. There is moderate concentric left ventricular hypertrophy. Left ventricular diastolic parameters are consistent with Grade I diastolic dysfunction (impaired relaxation). 2. Right ventricular systolic function is normal. The right ventricular size is normal. 3. Left atrial size was mildly dilated. 4. The mitral valve  is normal in structure. Trivial mitral valve regurgitation. No evidence of mitral stenosis. 5. The aortic valve is tricuspid. There is mild calcification of the aortic valve. Aortic valve regurgitation is trivial. Aortic valve sclerosis/calcification is present, without any evidence of aortic stenosis. 6. The inferior vena cava is normal in size with greater than 50% respiratory variability, suggesting right atrial pressure of 3 mmHg.  FINDINGS Left Ventricle: Left ventricular ejection fraction, by estimation, is 65 to 70%. The left ventricle has normal function. The left ventricle  has no regional wall motion abnormalities. The left ventricular internal cavity size was normal in size. There is moderate concentric left ventricular hypertrophy. Left ventricular diastolic parameters are consistent with Grade I diastolic dysfunction (impaired relaxation).  Right Ventricle: The right ventricular size is normal. No increase in right ventricular wall thickness. Right ventricular systolic function is normal.  Left Atrium: Left atrial size was mildly dilated.  Right Atrium: Right atrial size was normal in size.  Pericardium: There is no evidence of pericardial effusion.  Mitral Valve: The mitral valve is normal in structure. Trivial mitral valve regurgitation. No evidence of mitral valve stenosis. MV peak gradient, 5.9 mmHg. The mean mitral valve gradient is 4.0 mmHg.  Tricuspid Valve: The tricuspid valve is normal in structure. Tricuspid valve regurgitation is trivial. No evidence of tricuspid stenosis.  Aortic Valve: The aortic valve is tricuspid. There is mild calcification of the aortic valve. Aortic valve regurgitation is trivial. Aortic valve sclerosis/calcification is present, without any evidence of aortic stenosis. Aortic valve mean gradient measures 7.0 mmHg. Aortic valve peak gradient measures 12.8 mmHg. Aortic valve area, by VTI measures 2.43 cm.  Pulmonic Valve: The pulmonic valve was normal in structure. Pulmonic valve regurgitation is not visualized. No evidence of pulmonic stenosis.  Aorta: The aortic root is normal in size and structure.  Venous: The inferior vena cava is normal in size with greater than 50% respiratory variability, suggesting right atrial pressure of 3 mmHg.  IAS/Shunts: No atrial level shunt detected by color flow Doppler.   LEFT VENTRICLE PLAX 2D LVIDd:         4.00 cm   Diastology LVIDs:         2.60 cm   LV e' medial:    6.42 cm/s LV PW:         1.50 cm   LV E/e' medial:  19.6 LV IVS:        1.70 cm   LV e' lateral:   5.22  cm/s LVOT diam:     2.20 cm   LV E/e' lateral: 24.1 LV SV:         87 LV SV Index:   41 LVOT Area:     3.80 cm   RIGHT VENTRICLE             IVC RV Basal diam:  4.70 cm     IVC diam: 2.40 cm RV S prime:     14.70 cm/s TAPSE (M-mode): 2.1 cm  LEFT ATRIUM             Index        RIGHT ATRIUM           Index LA diam:        2.80 cm 1.30 cm/m   RA Area:     19.00 cm LA Vol (A2C):   91.4 ml 42.58 ml/m  RA Volume:   51.60 ml  24.04 ml/m LA Vol (A4C):   59.8 ml 27.86 ml/m LA  Biplane Vol: 75.3 ml 35.08 ml/m AORTIC VALVE AV Area (Vmax):    2.48 cm AV Area (Vmean):   2.30 cm AV Area (VTI):     2.43 cm AV Vmax:           179.00 cm/s AV Vmean:          124.000 cm/s AV VTI:            0.358 m AV Peak Grad:      12.8 mmHg AV Mean Grad:      7.0 mmHg LVOT Vmax:         117.00 cm/s LVOT Vmean:        74.900 cm/s LVOT VTI:          0.229 m LVOT/AV VTI ratio: 0.64  AORTA Ao Root diam: 3.40 cm Ao Asc diam:  3.60 cm  MITRAL VALVE MV Area (PHT): 2.93 cm     SHUNTS MV Area VTI:   2.69 cm     Systemic VTI:  0.23 m MV Peak grad:  5.9 mmHg     Systemic Diam: 2.20 cm MV Mean grad:  4.0 mmHg MV Vmax:       1.21 m/s MV Vmean:      90.9 cm/s MV Decel Time: 259 msec MV E velocity: 126.00 cm/s MV A velocity: 108.00 cm/s MV E/A ratio:  1.17  Arvilla Meres MD Electronically signed by Arvilla Meres MD Signature Date/Time: 02/16/2024/2:06:24 PM    Final          ______________________________________________________________________________________________      Risk Assessment/Calculations:    CHA2DS2-VASc Score = 7   This indicates a 11.2% annual risk of stroke. The patient's score is based upon: CHF History: 1 HTN History: 1 Diabetes History: 1 Stroke History: 2 Vascular Disease History: 1 Age Score: 1 Gender Score: 0           Physical Exam:   VS:  BP (!) 168/92   Pulse 71   Ht 5\' 10"  (1.778 m)   Wt 224 lb (101.6 kg)   SpO2 96%   BMI 32.14 kg/m     Wt Readings from Last 3 Encounters:  03/01/24 224 lb (101.6 kg)  02/26/24 219 lb 3.2 oz (99.4 kg)  02/16/24 215 lb (97.5 kg)    GEN: Well nourished, well developed in no acute distress NECK: No JVD; No carotid bruits CARDIAC: RRR, no murmurs, rubs, gallops RESPIRATORY:  Clear to auscultation without rales, wheezing or rhonchi  ABDOMEN: Soft, non-tender, non-distended EXTREMITIES:  No edema; No deformity   ASSESSMENT AND PLAN: .    PAF / Hypercoagulable state / On Amiodarone Therapy - Persistent nausea since hospital discharge likely related to Amidoarone. Reduce to Amiodarone 200mg  daily.  Has follow-up with A-fib clinic next week.  If nausea persist may need to consider alternate AAD. Did not tolerate afib well with anginal symptoms.  CHA2DS2-VASc Score = 7 [CHF History: 1, HTN History: 1, Diabetes History: 1, Stroke History: 2, Vascular Disease History: 1, Age Score: 1, Gender Score: 0].  Therefore, the patient's annual risk of stroke is 11.2 %.    Continue Eliquis 5mg  BID. Does not meet dose reduction criteria. Consider CBC for monitoring at visit 04/08/24 as will have been on OAC 6 weeks.  CAD / HLD, LDL goal <55 / Familial hyperlipidemia / Statin myopathy - Known CTO of RCA and LAD recommended for medical management.  Did not tolerate amlodipine with lower extremity edema.  Ranexa previously cost prohibitive.  Prior  labs 03/29/21 with LDL 232 consistent with familial hyperlipidemia. 02/11/24 LDL 144.  Present GDMT includes carvedilol 25 mg twice daily, Zetia 10 mg daily, Imdur 120 mg daily, rosuvastatin 40 mg daily.  No aspirin due to anticoagulation.   Difficulty with myalgias on multiple doses of rosuvastatin (10mg , 20mg , 40mg ).  Will initiate prior authorization process for Leqvio Incliseron).   OSA - CPAP compliance encouraged.   HTN - BP not at goal <130/80.  Diltiazem discontinued during hospitalization due to first-degree AV block.  Amlodipine previously not tolerated well due to  lower extremity edema.   Stop losartan 100 mg daily, start valsartan 160 mg daily. Will inquire if BMET can be checked at Afib clinic next week.  Could consider further increasing dose at follow-up.   Continue carvedilol 25 mg twice daily, Imdur 120 mg daily. Discussed to monitor BP at home at least 2 hours after medications and sitting for 5-10 minutes.   HFpEF - Euvolemic and well compensated on exam. Not presently requiring loop diuretic. Continue Valsaratan 160mg  daily, Carvedilol 25mg  BID.        Dispo: follow up next week as scheduled with AFib Clinic  Signed, Clearnce Curia, NP

## 2024-03-01 NOTE — Patient Instructions (Addendum)
 Medication Instructions:  Your physician has recommended you make the following change in your medication:   Change: Amiodarone 200mg  daily   Stop Losartan   Start: Valsartan 160mg  daily   Follow-Up: At [facility], you and your health needs are our priority.  As part of our continuing mission to provide you with exceptional heart care, our providers are all part of one team.  This team includes your primary Cardiologist (physician) and Advanced Practice Providers or APPs (Physician Assistants and Nurse Practitioners) who all work together to provide you with the care you need, when you need it.  Your next appointment:   4-6 weeks with Dr. Mallipeddi or APP   We recommend signing up for the patient portal called "MyChart".  Sign up information is provided on this After Visit Summary.  MyChart is used to connect with patients for Virtual Visits (Telemedicine).  Patients are able to view lab/test results, encounter notes, upcoming appointments, etc.  Non-urgent messages can be sent to your provider as well.   To learn more about what you can do with MyChart, go to [email or web address].   Other Instructions We have started paperwork to see if Leqvio (Incliseron) a once every 6 month injection to lower cholesterol would be approved by your insurance We have reduced Amiodarone today to help improve your nausea. Please check BP once per day at least an hour after your medications.

## 2024-03-03 ENCOUNTER — Ambulatory Visit (INDEPENDENT_AMBULATORY_CARE_PROVIDER_SITE_OTHER): Payer: Medicare Other | Admitting: Nurse Practitioner

## 2024-03-03 ENCOUNTER — Encounter: Payer: Self-pay | Admitting: Nurse Practitioner

## 2024-03-03 VITALS — BP 163/67 | HR 73 | Ht 70.0 in | Wt 223.0 lb

## 2024-03-03 DIAGNOSIS — E66811 Obesity, class 1: Secondary | ICD-10-CM | POA: Diagnosis not present

## 2024-03-03 DIAGNOSIS — Z6832 Body mass index (BMI) 32.0-32.9, adult: Secondary | ICD-10-CM

## 2024-03-03 DIAGNOSIS — I4891 Unspecified atrial fibrillation: Secondary | ICD-10-CM

## 2024-03-03 DIAGNOSIS — G4733 Obstructive sleep apnea (adult) (pediatric): Secondary | ICD-10-CM | POA: Diagnosis not present

## 2024-03-03 NOTE — Assessment & Plan Note (Signed)
 Severe OSA on CPAP. Excellent compliance and control. Receives benefit from use. Having some difficulties with leaks and mask fit. Adjusted his CPAP settings in office today - auto 8-13 cmH2O, EPR level 1. Decreased humidity setting down to level 3 due to water in his tubing. He is aware to notify if symptoms worsen or fail to improve on this. Suspect he does not need as high of a pressure as his current set one. Discussed some alternative masks - he would like to try the AirFit F40. Orders placed today. Will reassess at follow up. Aware of proper care/use of device. Understands risks of untreated OSA. Encouraged to continue using nightly. Safe driving practices reviewed.  Patient Instructions  Continue to use CPAP every night, minimum of 4-6 hours a night.  Change equipment as directed. Wash your tubing with warm soap and water daily, hang to dry. Wash humidifier portion weekly. Use bottled, distilled water and change daily Be aware of reduced alertness and do not drive or operate heavy machinery if experiencing this or drowsiness.  Exercise encouraged, as tolerated. Healthy weight management discussed.  Avoid or decrease alcohol consumption and medications that make you more sleepy, if possible. Notify if persistent daytime sleepiness occurs even with consistent use of PAP therapy.  Change to AirFit F40 mask   Settings changed today to auto setting 8-13 cmH2O  Let me know if you're still having issues with your pressures or if you feel like you're not getting enough air   Follow up in 6 weeks to see how mask change and pressure changes are doing with Katie Hertha Gergen,NP. Can be virtual if needed. If symptoms do not improve or worsen, please contact office for sooner follow up   Keep future appointments with Dr. Waymond Hailey as scheduled

## 2024-03-03 NOTE — Progress Notes (Signed)
 @Patient  ID: Robert Brown, male    DOB: [birth year], 73 y.o.   MRN: [id number]  No chief complaint on file.   Referring provider: Wyvonna Heidelberg*  HPI: 73 year old male, former smoker referred for sleep consult. He is followed by Dr. Waymond Hailey for COPD Gold 0. Past medical history significant for HFpEF, CAD, HTN, a fib RVR on Eliquis and amio, hx of stroke, vertebral artery stenosis, OSA on CPAP, DM, hypothyroid, BPH, radiculopathy, hx of prostate cancer, HLD.   TEST/EVENTS:  06/2019 NPSG outside facility: RDI 33, SpO2 low 88%  03/03/2024: Today - follow up Discussed the use of AI scribe software for clinical note transcription with the patient, who gave verbal consent to proceed.  History of Present Illness   Robert Brown is a 73 year old male who presents for a sleep consult regarding his CPAP therapy. He was referred for a sleep consult following a recent hospitalization for new onset a fib. His OSA was previously managed at [facility].   He has been on CPAP therapy for many years due to sleep apnea, with the last sleep study conducted in 2022 at [facility]. He uses his CPAP machine every night but experiences issues with the machine blowing too hard sometimes, waking him up, and requiring him to restart it. The mask is uncomfortable and causes leaks. He also has to refill the water tank during the night.  He has tried different masks. He like the hybrid mask but had dry mouth with it. Never made any other setting adjustments while on it. He's currently using a large FFM, which he feels doesn't seal well around his nose and he doesn't like th headgear. It can be cumbersome to get on.   Despite the issues, he cannot sleep without the CPAP as it significantly improves his sleep quality, reducing nighttime awakenings and the need to urinate frequently. He denies any drowsy driving.   He has felt fatigued during the day, which he attributes to recent heart issues and medication  changes. Before these heart issues, he felt the CPAP improved his sleep and energy levels.      12/04/2023-03/02/2024: CPAP 13 cmH2O 88/90 days; 94% >4 hr; average use 7 hr 40 min Leaks 95th 81.1 AHI 0.2   Epworth 8   Allergies  Allergen Reactions   Alcohol Other (See Comments)    Prefers not to drink   Lisinopril     Medicines ending in -pril - unknown reaction   Statins     myalgia    Immunization History  Administered Date(s) Administered   Fluad Quad(high Dose 65+) 09/10/2020   Fluad Trivalent(High Dose 65+) 10/13/2023   Influenza, High Dose Seasonal PF 09/06/2018, 10/11/2019, 08/28/2021   PFIZER Comirnaty(Gray Top)Covid-19 Tri-Sucrose Vaccine 03/12/2020, 04/06/2020, 10/22/2020, 04/01/2021   PFIZER(Purple Top)SARS-COV-2 Vaccination 03/12/2020, 04/06/2020, 10/22/2020   Pfizer Covid-19 Vaccine Bivalent Booster 62yrs & up 09/04/2021   Pneumococcal Conjugate-13 06/18/2015   Pneumococcal Polysaccharide-23 06/23/2017, 06/23/2017   Tdap 02/22/2020    Past Medical History:  Diagnosis Date   CAD (coronary artery disease)    Cancer (HCC)    Diabetes mellitus without complication (HCC)    Hypertension    Stroke (HCC)     Tobacco History: Social History   Tobacco Use  Smoking Status Former   Current packs/day: 0.00   Types: Cigarettes   Quit date: 11/17/2006   Years since quitting: 17.3  Smokeless Tobacco Never   Counseling given: Not Answered   Outpatient Medications Prior to  Visit  Medication Sig Dispense Refill   albuterol (PROVENTIL) (2.5 MG/3ML) 0.083% nebulizer solution Take 3 mLs (2.5 mg total) by nebulization every 6 (six) hours as needed for wheezing or shortness of breath. 225 mL 3   albuterol (VENTOLIN HFA) 108 (90 Base) MCG/ACT inhaler Inhale 1-2 puffs into the lungs every 6 (six) hours as needed for shortness of breath or wheezing. 54 g 3   amiodarone (PACERONE) 200 MG tablet Take 1 tablet (200 mg total) by mouth daily. 30 tablet 11   apixaban (ELIQUIS)  5 MG TABS tablet Take 1 tablet (5 mg total) by mouth 2 (two) times daily. 180 tablet 0   Blood Glucose Monitoring Suppl (ACCU-CHEK GUIDE) w/Device KIT Use to check glucose once daily. 1 kit 0   busPIRone (BUSPAR) 5 MG tablet Take 5 mg by mouth daily as needed (for anxiety).     carvedilol (COREG) 25 MG tablet Take 25 mg by mouth 2 (two) times daily.     Continuous Glucose Sensor (FREESTYLE LIBRE 3 PLUS SENSOR) MISC Change sensor every 15 days. 6 each 3   doxycycline (VIBRAMYCIN) 50 MG capsule Take 50 mg by mouth daily.     ezetimibe (ZETIA) 10 MG tablet Take 1 tablet (10 mg total) by mouth daily. 90 tablet 0   finasteride (PROSCAR) 5 MG tablet Take 5 mg by mouth daily.     Fluticasone-Umeclidin-Vilant (TRELEGY ELLIPTA) 100-62.5-25 MCG/ACT AEPB One click each am (Patient taking differently: Take 1 puff by mouth daily as needed (for shortness of breath).) 3 each 3   isosorbide mononitrate (IMDUR) 120 MG 24 hr tablet Take 1 tablet (120 mg total) by mouth daily. 90 tablet 3   levothyroxine (SYNTHROID) 88 MCG tablet TAKE 1 TABLET BY MOUTH EVERY  MORNING 90 tablet 0   metFORMIN (GLUCOPHAGE) 500 MG tablet Take 1 tablet (500 mg total) by mouth 2 (two) times daily. 180 tablet 3   nitroGLYCERIN (NITROSTAT) 0.4 MG SL tablet Place 1 tablet (0.4 mg total) under the tongue every 5 (five) minutes as needed for chest pain. If a single episode of chest pain is not relieved by one tablet, the patient will try another within 5 minutes; and if this doesn't relieve the pain, the patient is instructed to call 911 for transportation to an emergency department. 25 tablet 3   ondansetron (ZOFRAN-ODT) 4 MG disintegrating tablet Take 1 tablet (4 mg total) by mouth every 8 (eight) hours as needed for nausea or vomiting. 20 tablet 0   rosuvastatin (CRESTOR) 40 MG tablet Take 1 tablet (40 mg total) by mouth daily. 90 tablet 3   Semaglutide, 1 MG/DOSE, (OZEMPIC, 1 MG/DOSE,) 2 MG/1.5ML SOPN Inject 1 mg into the skin every Tuesday.      tamsulosin (FLOMAX) 0.4 MG CAPS capsule Take 0.4 mg by mouth 2 (two) times daily.     valsartan (DIOVAN) 160 MG tablet Take 1 tablet (160 mg total) by mouth daily. 30 tablet 11   No facility-administered medications prior to visit.     Review of Systems:   Constitutional: No weight loss or gain, night sweats, fevers, chills, or lassitude. +fatigue  HEENT: No headaches, difficulty swallowing, tooth/dental problems, or sore throat. No sneezing, itching, ear ache, nasal congestion, or post nasal drip CV:  +palpitations. No chest pain, orthopnea, PND, swelling in lower extremities, anasarca, dizziness, syncope Resp: +stable shortness of breath with exertion. No excess mucus or change in color of mucus. No productive or non-productive. No hemoptysis. No wheezing.  No chest  wall deformity GI:  No heartburn, indigestion GU: No nocturia   Skin: No rash, lesions, ulcerations Neuro: No memory impairment  Psych: No depression or anxiety. Mood stable. +sleep disturbance     Physical Exam:  BP (!) 163/67   Pulse 73   Ht 5\' 10"  (1.778 m)   Wt 223 lb (101.2 kg)   SpO2 93%   BMI 32.00 kg/m   GEN: Pleasant, interactive, well-kempt; obese; in no acute distress HEENT:  Normocephalic and atraumatic. PERRLA. Sclera white. Nasal turbinates pink, moist and patent bilaterally. No rhinorrhea present. Oropharynx pink and moist, without exudate or edema. No lesions, ulcerations, or postnasal drip. Mallampati III NECK:  Supple w/ fair ROM. No JVD. Thyroid symmetrical with no goiter or nodules palpated. No lymphadenopathy.   CV: RRR, no m/r/g, no peripheral edema. Pulses intact, +2 bilaterally. No cyanosis, pallor or clubbing. PULMONARY:  Unlabored, regular breathing. Clear bilaterally A&P w/o wheezes/rales/rhonchi. No accessory muscle use.  GI: BS present and normoactive. Soft, non-tender to palpation. No organomegaly or masses detected.  MSK: No erythema, warmth or tenderness. Cap refil <2 sec all  extrem.  Neuro: A/Ox3. No focal deficits noted.   Skin: Warm, no lesions or rashe Psych: Normal affect and behavior. Judgement and thought content appropriate.     Lab Results:  CBC    Component Value Date/Time   WBC 9.2 02/26/2024 1340   WBC 9.3 02/17/2024 0508   RBC 4.86 02/26/2024 1340   RBC 4.69 02/17/2024 0508   HGB 13.0 02/26/2024 1340   HCT 41.0 02/26/2024 1340   PLT 156 02/26/2024 1340   MCV 84 02/26/2024 1340   MCH 26.7 02/26/2024 1340   MCH 26.9 02/17/2024 0508   MCHC 31.7 02/26/2024 1340   MCHC 32.3 02/17/2024 0508   RDW 13.3 02/26/2024 1340   LYMPHSABS 1.5 02/26/2024 1340   MONOABS 0.9 02/15/2024 2215   EOSABS 0.2 02/26/2024 1340   BASOSABS 0.0 02/26/2024 1340    BMET    Component Value Date/Time   NA 143 02/26/2024 1340   K 3.9 02/26/2024 1340   CL 101 02/26/2024 1340   CO2 25 02/26/2024 1340   GLUCOSE 133 (H) 02/26/2024 1340   GLUCOSE 114 (H) 02/17/2024 0508   BUN 15 02/26/2024 1340   CREATININE 1.32 (H) 02/26/2024 1340   CALCIUM 9.2 02/26/2024 1340   GFRNONAA >60 02/17/2024 0508   GFRAA >60 06/21/2020 1510    BNP    Component Value Date/Time   BNP 84.8 02/15/2024 2215     Imaging:  ECHOCARDIOGRAM COMPLETE Result Date: 02/16/2024    ECHOCARDIOGRAM REPORT   Patient Name:   ARLIN SASS Date of Exam: 02/16/2024 Medical Rec #:  [id number]    Height:       70.5 in Accession #:    [id number]   Weight:       211.0 lb Date of Birth:  [birth year]     BSA:          2.147 m Patient Age:    72 years     BP:           153/88 mmHg Patient Gender: M            HR:           72 bpm. Exam Location:  Inpatient Procedure: 2D Echo, Cardiac Doppler and Color Doppler (Both Spectral and Color            Flow Doppler were utilized during procedure). Indications:    Atrial  Fibrillation  History:        Patient has no prior history of Echocardiogram examinations.                 CHF, CAD, COPD and Stroke; Risk Factors:Hypertension, Diabetes                 and Former Smoker.   Sonographer:    Willey Harrier Referring Phys: [id number] ZANE ADAMS IMPRESSIONS  1. Left ventricular ejection fraction, by estimation, is 65 to 70%. The left ventricle has normal function. The left ventricle has no regional wall motion abnormalities. There is moderate concentric left ventricular hypertrophy. Left ventricular diastolic parameters are consistent with Grade I diastolic dysfunction (impaired relaxation).  2. Right ventricular systolic function is normal. The right ventricular size is normal.  3. Left atrial size was mildly dilated.  4. The mitral valve is normal in structure. Trivial mitral valve regurgitation. No evidence of mitral stenosis.  5. The aortic valve is tricuspid. There is mild calcification of the aortic valve. Aortic valve regurgitation is trivial. Aortic valve sclerosis/calcification is present, without any evidence of aortic stenosis.  6. The inferior vena cava is normal in size with greater than 50% respiratory variability, suggesting right atrial pressure of 3 mmHg. FINDINGS  Left Ventricle: Left ventricular ejection fraction, by estimation, is 65 to 70%. The left ventricle has normal function. The left ventricle has no regional wall motion abnormalities. The left ventricular internal cavity size was normal in size. There is  moderate concentric left ventricular hypertrophy. Left ventricular diastolic parameters are consistent with Grade I diastolic dysfunction (impaired relaxation). Right Ventricle: The right ventricular size is normal. No increase in right ventricular wall thickness. Right ventricular systolic function is normal. Left Atrium: Left atrial size was mildly dilated. Right Atrium: Right atrial size was normal in size. Pericardium: There is no evidence of pericardial effusion. Mitral Valve: The mitral valve is normal in structure. Trivial mitral valve regurgitation. No evidence of mitral valve stenosis. MV peak gradient, 5.9 mmHg. The mean mitral valve gradient is 4.0 mmHg.  Tricuspid Valve: The tricuspid valve is normal in structure. Tricuspid valve regurgitation is trivial. No evidence of tricuspid stenosis. Aortic Valve: The aortic valve is tricuspid. There is mild calcification of the aortic valve. Aortic valve regurgitation is trivial. Aortic valve sclerosis/calcification is present, without any evidence of aortic stenosis. Aortic valve mean gradient measures 7.0 mmHg. Aortic valve peak gradient measures 12.8 mmHg. Aortic valve area, by VTI measures 2.43 cm. Pulmonic Valve: The pulmonic valve was normal in structure. Pulmonic valve regurgitation is not visualized. No evidence of pulmonic stenosis. Aorta: The aortic root is normal in size and structure. Venous: The inferior vena cava is normal in size with greater than 50% respiratory variability, suggesting right atrial pressure of 3 mmHg. IAS/Shunts: No atrial level shunt detected by color flow Doppler.  LEFT VENTRICLE PLAX 2D LVIDd:         4.00 cm   Diastology LVIDs:         2.60 cm   LV e' medial:    6.42 cm/s LV PW:         1.50 cm   LV E/e' medial:  19.6 LV IVS:        1.70 cm   LV e' lateral:   5.22 cm/s LVOT diam:     2.20 cm   LV E/e' lateral: 24.1 LV SV:         87 LV SV Index:   41 LVOT  Area:     3.80 cm  RIGHT VENTRICLE             IVC RV Basal diam:  4.70 cm     IVC diam: 2.40 cm RV S prime:     14.70 cm/s TAPSE (M-mode): 2.1 cm LEFT ATRIUM             Index        RIGHT ATRIUM           Index LA diam:        2.80 cm 1.30 cm/m   RA Area:     19.00 cm LA Vol (A2C):   91.4 ml 42.58 ml/m  RA Volume:   51.60 ml  24.04 ml/m LA Vol (A4C):   59.8 ml 27.86 ml/m LA Biplane Vol: 75.3 ml 35.08 ml/m  AORTIC VALVE AV Area (Vmax):    2.48 cm AV Area (Vmean):   2.30 cm AV Area (VTI):     2.43 cm AV Vmax:           179.00 cm/s AV Vmean:          124.000 cm/s AV VTI:            0.358 m AV Peak Grad:      12.8 mmHg AV Mean Grad:      7.0 mmHg LVOT Vmax:         117.00 cm/s LVOT Vmean:        74.900 cm/s LVOT VTI:           0.229 m LVOT/AV VTI ratio: 0.64  AORTA Ao Root diam: 3.40 cm Ao Asc diam:  3.60 cm MITRAL VALVE MV Area (PHT): 2.93 cm     SHUNTS MV Area VTI:   2.69 cm     Systemic VTI:  0.23 m MV Peak grad:  5.9 mmHg     Systemic Diam: 2.20 cm MV Mean grad:  4.0 mmHg MV Vmax:       1.21 m/s MV Vmean:      90.9 cm/s MV Decel Time: 259 msec MV E velocity: 126.00 cm/s MV A velocity: 108.00 cm/s MV E/A ratio:  1.17 Jules Oar MD Electronically signed by Jules Oar MD Signature Date/Time: 02/16/2024/2:06:24 PM    Final    DG Chest Port 1 View Result Date: 02/15/2024 CLINICAL DATA:  AFIB, chest pain EXAM: PORTABLE CHEST 1 VIEW COMPARISON:  Chest x-ray 04/28/2022 FINDINGS: The heart and mediastinal contours are unchanged. No focal consolidation. No pulmonary edema. No pleural effusion. No pneumothorax. No acute osseous abnormality. Bilateral acromioclavicular joint degenerative changes. IMPRESSION: No active disease. Electronically Signed   By: Morgane  Naveau M.D.   On: 02/15/2024 22:43    Administration History     None          Latest Ref Rng & Units 07/15/2022    1:57 PM  PFT Results  FVC-Pre L 2.04   FVC-Predicted Pre % 46   FVC-Post L 2.07   FVC-Predicted Post % 47   Pre FEV1/FVC % % 81   Post FEV1/FCV % % 79   FEV1-Pre L 1.66   FEV1-Predicted Pre % 51   FEV1-Post L 1.64   DLCO uncorrected ml/min/mmHg 15.87   DLCO UNC% % 61   DLVA Predicted % 121     No results found for: "NITRICOXIDE"      Assessment & Plan:   OSA (obstructive sleep apnea) Severe OSA on CPAP. Excellent compliance and control. Receives benefit from use.  Having some difficulties with leaks and mask fit. Adjusted his CPAP settings in office today - auto 8-13 cmH2O, EPR level 1. Decreased humidity setting down to level 3 due to water in his tubing. He is aware to notify if symptoms worsen or fail to improve on this. Suspect he does not need as high of a pressure as his current set one. Discussed some alternative  masks - he would like to try the AirFit F40. Orders placed today. Will reassess at follow up. Aware of proper care/use of device. Understands risks of untreated OSA. Encouraged to continue using nightly. Safe driving practices reviewed.  Patient Instructions  Continue to use CPAP every night, minimum of 4-6 hours a night.  Change equipment as directed. Wash your tubing with warm soap and water daily, hang to dry. Wash humidifier portion weekly. Use bottled, distilled water and change daily Be aware of reduced alertness and do not drive or operate heavy machinery if experiencing this or drowsiness.  Exercise encouraged, as tolerated. Healthy weight management discussed.  Avoid or decrease alcohol consumption and medications that make you more sleepy, if possible. Notify if persistent daytime sleepiness occurs even with consistent use of PAP therapy.  Change to AirFit F40 mask   Settings changed today to auto setting 8-13 cmH2O  Let me know if you're still having issues with your pressures or if you feel like you're not getting enough air   Follow up in 6 weeks to see how mask change and pressure changes are doing with Katie Tyliyah Mcmeekin,NP. Can be virtual if needed. If symptoms do not improve or worsen, please contact office for sooner follow up   Keep future appointments with Dr. Waymond Hailey as scheduled     Obesity (BMI 30.0-34.9) BMI 32. Healthy weight loss encouraged  Atrial fibrillation with rapid ventricular response (HCC) Sounds to be in NSR today. Follow up with cardiology as scheduled   Advised if symptoms do not improve or worsen, to please contact office for sooner follow up or seek emergency care.   I spent 35 minutes of dedicated to the care of this patient on the date of this encounter to include pre-visit review of records, face-to-face time with the patient discussing conditions above, post visit ordering of testing, clinical documentation with the electronic health record, making  appropriate referrals as documented, and communicating necessary findings to members of the patients care team.  Roetta Clarke, NP 03/03/2024  Pt aware and understands NP's role.

## 2024-03-03 NOTE — Assessment & Plan Note (Signed)
 Sounds to be in NSR today. Follow up with cardiology as scheduled

## 2024-03-03 NOTE — Patient Instructions (Addendum)
 Continue to use CPAP every night, minimum of 4-6 hours a night.  Change equipment as directed. Wash your tubing with warm soap and water daily, hang to dry. Wash humidifier portion weekly. Use bottled, distilled water and change daily Be aware of reduced alertness and do not drive or operate heavy machinery if experiencing this or drowsiness.  Exercise encouraged, as tolerated. Healthy weight management discussed.  Avoid or decrease alcohol consumption and medications that make you more sleepy, if possible. Notify if persistent daytime sleepiness occurs even with consistent use of PAP therapy.  Change to AirFit F40 mask   Settings changed today to auto setting 8-13 cmH2O  Let me know if you're still having issues with your pressures or if you feel like you're not getting enough air   Follow up in 6 weeks to see how mask change and pressure changes are doing with Robert Tennyson Kallen,NP. Can be virtual if needed. If symptoms do not improve or worsen, please contact office for sooner follow up   Keep future appointments with Dr. Waymond Hailey as scheduled

## 2024-03-03 NOTE — Assessment & Plan Note (Signed)
 BMI 32. Healthy weight loss encouraged.

## 2024-03-07 ENCOUNTER — Other Ambulatory Visit: Payer: Self-pay | Admitting: Pharmacist Clinician (PhC)/ Clinical Pharmacy Specialist

## 2024-03-07 ENCOUNTER — Telehealth: Payer: Self-pay | Admitting: Pharmacist Clinician (PhC)/ Clinical Pharmacy Specialist

## 2024-03-07 ENCOUNTER — Encounter (HOSPITAL_COMMUNITY): Payer: Self-pay | Admitting: Physician Assistant

## 2024-03-07 ENCOUNTER — Encounter (HOSPITAL_COMMUNITY): Payer: Self-pay

## 2024-03-07 ENCOUNTER — Ambulatory Visit (HOSPITAL_COMMUNITY)
Admission: RE | Admit: 2024-03-07 | Discharge: 2024-03-07 | Disposition: A | Discharge status: Home / Self Care | Source: Ambulatory Visit | Attending: Physician Assistant | Admitting: Physician Assistant

## 2024-03-07 VITALS — BP 170/110 | HR 77 | Ht 70.0 in | Wt 223.0 lb

## 2024-03-07 DIAGNOSIS — Z7901 Long term (current) use of anticoagulants: Secondary | ICD-10-CM | POA: Diagnosis not present

## 2024-03-07 DIAGNOSIS — Z5181 Encounter for therapeutic drug level monitoring: Secondary | ICD-10-CM | POA: Diagnosis not present

## 2024-03-07 DIAGNOSIS — D6869 Other thrombophilia: Secondary | ICD-10-CM | POA: Diagnosis not present

## 2024-03-07 DIAGNOSIS — J449 Chronic obstructive pulmonary disease, unspecified: Secondary | ICD-10-CM | POA: Diagnosis not present

## 2024-03-07 DIAGNOSIS — I2510 Atherosclerotic heart disease of native coronary artery without angina pectoris: Secondary | ICD-10-CM | POA: Insufficient documentation

## 2024-03-07 DIAGNOSIS — Z79899 Other long term (current) drug therapy: Secondary | ICD-10-CM

## 2024-03-07 DIAGNOSIS — I10 Essential (primary) hypertension: Secondary | ICD-10-CM

## 2024-03-07 DIAGNOSIS — I5032 Chronic diastolic (congestive) heart failure: Secondary | ICD-10-CM | POA: Insufficient documentation

## 2024-03-07 DIAGNOSIS — I110 Hypertensive heart disease with heart failure: Secondary | ICD-10-CM | POA: Diagnosis not present

## 2024-03-07 DIAGNOSIS — I480 Paroxysmal atrial fibrillation: Secondary | ICD-10-CM

## 2024-03-07 DIAGNOSIS — G4733 Obstructive sleep apnea (adult) (pediatric): Secondary | ICD-10-CM | POA: Insufficient documentation

## 2024-03-07 DIAGNOSIS — Z7985 Long-term (current) use of injectable non-insulin antidiabetic drugs: Secondary | ICD-10-CM | POA: Diagnosis not present

## 2024-03-07 DIAGNOSIS — Z7984 Long term (current) use of oral hypoglycemic drugs: Secondary | ICD-10-CM | POA: Diagnosis not present

## 2024-03-07 DIAGNOSIS — Z8673 Personal history of transient ischemic attack (TIA), and cerebral infarction without residual deficits: Secondary | ICD-10-CM | POA: Insufficient documentation

## 2024-03-07 DIAGNOSIS — E119 Type 2 diabetes mellitus without complications: Secondary | ICD-10-CM | POA: Insufficient documentation

## 2024-03-07 LAB — BASIC METABOLIC PANEL WITH GFR
Anion gap: 11 (ref 5–15)
BUN: 12 mg/dL (ref 8–23)
CO2: 26 mmol/L (ref 22–32)
Calcium: 8.6 mg/dL — ABNORMAL LOW (ref 8.9–10.3)
Chloride: 102 mmol/L (ref 98–111)
Creatinine, Ser: 1.34 mg/dL — ABNORMAL HIGH (ref 0.61–1.24)
GFR, Estimated: 56 mL/min — ABNORMAL LOW (ref >60)
Glucose, Bld: 114 mg/dL — ABNORMAL HIGH (ref 70–99)
Potassium: 3.4 mmol/L — ABNORMAL LOW (ref 3.5–5.1)
Sodium: 139 mmol/L (ref 135–145)

## 2024-03-07 NOTE — Patient Instructions (Signed)
 Start Valsartan     Have labwork drawn at [facility] in 2 weeks

## 2024-03-07 NOTE — Telephone Encounter (Signed)
-----   Message from Shearon Denis sent at 03/07/2024 11:02 AM EDT ----- Caitlin and Chelsy Parrales, I submitted the prior authorization for Leqvio and just received a call that it will be denied because the patient hasn't tried Praluent or Repatha. I will complete the referral and put the denial letter in the media tab as soon as I receive it.  Thank you, Krista

## 2024-03-07 NOTE — Progress Notes (Signed)
 Primary Care Physician: Tobi Fortes, MD Primary Cardiologist: Vishnu P Mallipeddi, MD Electrophysiologist: None  Referring Physician: Neomi Banks NP   Robert Brown is a 73 y.o. male with a history of CAD, CHF, CVA, COPD, HTN, DM, OSA, atrial fibrillation who presents for follow up in the [facility].  Patient was hospitalized 3/31-02/17/2024 after presenting with chest pain found to have atrial fibrillation with RVR.  Placed on IV diltiazem  drip.  Returned to NSR. Started on amiodarone  given significant anginal symptoms on presentation. Diltiazem  stopped due to first degree heart block. Aspirin  was stopped and Eliquis  initiated for stroke prevention. Patient reported nausea at his follow up visit on 4/15 and his amiodarone  was decreased.   Patient presents today for follow up for atrial fibrillation and amiodarone  monitoring. He reports that his nausea improved on the lower dose of amiodarone . He remains in SR. He states that he never swapped losartan  to valsartan . No bleeding issues on anticoagulation.   Today, he denies symptoms of palpitations, chest pain, shortness of breath, orthopnea, PND, lower extremity edema, dizziness, presyncope, syncope, bleeding, or neurologic sequela. The patient is tolerating medications without difficulties and is otherwise without complaint today.    Atrial Fibrillation Risk Factors:  he does have symptoms or diagnosis of sleep apnea. he does not have a history of rheumatic fever. he does not have a history of alcohol  use.   Atrial Fibrillation Management history:  Previous antiarrhythmic drugs: amiodarone   Previous cardioversions: none Previous ablations: none Anticoagulation history: Eliquis   ROS- All systems are reviewed and negative except as per the HPI above.  Past Medical History:  Diagnosis Date   CAD (coronary artery disease)    Cancer (HCC)    Diabetes mellitus without complication (HCC)     Hypertension    Stroke [facility])     Current Outpatient Medications  Medication Sig Dispense Refill   albuterol  (PROVENTIL ) (2.5 MG/3ML) 0.083% nebulizer solution Take 3 mLs (2.5 mg total) by nebulization every 6 (six) hours as needed for wheezing or shortness of breath. 225 mL 3   albuterol  (VENTOLIN  HFA) 108 (90 Base) MCG/ACT inhaler Inhale 1-2 puffs into the lungs every 6 (six) hours as needed for shortness of breath or wheezing. 54 g 3   amiodarone  (PACERONE ) 200 MG tablet Take 1 tablet (200 mg total) by mouth daily. 30 tablet 11   apixaban  (ELIQUIS ) 5 MG TABS tablet Take 1 tablet (5 mg total) by mouth 2 (two) times daily. 180 tablet 0   Blood Glucose Monitoring Suppl (ACCU-CHEK GUIDE) w/Device KIT Use to check glucose once daily. 1 kit 0   busPIRone  (BUSPAR ) 5 MG tablet Take 5 mg by mouth daily as needed (for anxiety).     carvedilol  (COREG ) 25 MG tablet Take 25 mg by mouth 2 (two) times daily.     Continuous Glucose Sensor (FREESTYLE LIBRE 3 PLUS SENSOR) MISC Change sensor every 15 days. 6 each 3   doxycycline  (VIBRAMYCIN ) 50 MG capsule Take 50 mg by mouth daily.     ezetimibe  (ZETIA ) 10 MG tablet Take 1 tablet (10 mg total) by mouth daily. 90 tablet 0   finasteride  (PROSCAR ) 5 MG tablet Take 5 mg by mouth daily.     Fluticasone -Umeclidin-Vilant (TRELEGY ELLIPTA ) 100-62.5-25 MCG/ACT AEPB One click each am (Patient taking differently: Take 1 puff by mouth daily as needed (for shortness of breath).) 3 each 3   isosorbide  mononitrate (IMDUR ) 120 MG 24 hr tablet Take 1 tablet (120 mg  total) by mouth daily. 90 tablet 3   levothyroxine  (SYNTHROID ) 88 MCG tablet TAKE 1 TABLET BY MOUTH EVERY  MORNING 90 tablet 0   metFORMIN  (GLUCOPHAGE ) 500 MG tablet Take 1 tablet (500 mg total) by mouth 2 (two) times daily. 180 tablet 3   nitroGLYCERIN  (NITROSTAT ) 0.4 MG SL tablet Place 1 tablet (0.4 mg total) under the tongue every 5 (five) minutes as needed for chest pain. If a single episode of chest pain is not  relieved by one tablet, the patient will try another within 5 minutes; and if this doesn't relieve the pain, the patient is instructed to call 911 for transportation to an emergency department. 25 tablet 3   ondansetron  (ZOFRAN -ODT) 4 MG disintegrating tablet Take 1 tablet (4 mg total) by mouth every 8 (eight) hours as needed for nausea or vomiting. 20 tablet 0   rosuvastatin  (CRESTOR ) 40 MG tablet Take 1 tablet (40 mg total) by mouth daily. 90 tablet 3   Semaglutide , 1 MG/DOSE, (OZEMPIC , 1 MG/DOSE,) 2 MG/1.5ML SOPN Inject 1 mg into the skin every Tuesday.     tamsulosin  (FLOMAX ) 0.4 MG CAPS capsule Take 0.4 mg by mouth 2 (two) times daily.     valsartan  (DIOVAN ) 160 MG tablet Take 1 tablet (160 mg total) by mouth daily. 30 tablet 11   No current facility-administered medications for this encounter.    Physical Exam: BP (!) 170/110   Pulse 77   Ht 5\' 10"  (1.778 m)   Wt 101.2 kg   BMI 32.00 kg/m   GEN: Well nourished, well developed in no acute distress CARDIAC: Regular rate and rhythm, no murmurs, rubs, gallops RESPIRATORY:  Clear to auscultation without rales, wheezing or rhonchi  ABDOMEN: Soft, non-tender, non-distended EXTREMITIES:  No edema; No deformity   Wt Readings from Last 3 Encounters:  03/07/24 101.2 kg  03/03/24 101.2 kg  03/01/24 101.6 kg     EKG today demonstrates  SR Vent. rate 77 BPM PR interval 208 ms QRS duration 88 ms QT/QTcB 412/466 ms   Echo 02/16/24 demonstrated   1. Left ventricular ejection fraction, by estimation, is 65 to 70%. The  left ventricle has normal function. The left ventricle has no regional  wall motion abnormalities. There is moderate concentric left ventricular  hypertrophy. Left ventricular diastolic parameters are consistent with Grade I diastolic dysfunction (impaired relaxation).   2. Right ventricular systolic function is normal. The right ventricular  size is normal.   3. Left atrial size was mildly dilated.   4. The mitral  valve is normal in structure. Trivial mitral valve  regurgitation. No evidence of mitral stenosis.   5. The aortic valve is tricuspid. There is mild calcification of the  aortic valve. Aortic valve regurgitation is trivial. Aortic valve  sclerosis/calcification is present, without any evidence of aortic  stenosis.   6. The inferior vena cava is normal in size with greater than 50%  respiratory variability, suggesting right atrial pressure of 3 mmHg.    CHA2DS2-VASc Score = 7  The patient's score is based upon: CHF History: 1 HTN History: 1 Diabetes History: 1 Stroke History: 2 Vascular Disease History: 1 Age Score: 1 Gender Score: 0       ASSESSMENT AND PLAN: Paroxysmal Atrial Fibrillation (ICD10:  I48.0) The patient's CHA2DS2-VASc score is 7, indicating a 11.2% annual risk of stroke.   Patient appears to be maintaining SR We discussed rhythm control options including Multaq, dofetilide, amiodarone , and ablation. Would avoid class IC with h/o CAD.  He would prefer to continue amiodarone . Can consider decreasing to 100 mg if he is maintaining SR and his nausea returns.  Continue Eliquis  5 mg BID Continue amiodarone  200 mg daily  Secondary Hypercoagulable State (ICD10:  D68.69) The patient is at significant risk for stroke/thromboembolism based upon his CHA2DS2-VASc Score of 7.  Continue Apixaban  (Eliquis ). No bleeding issues.   High Risk Medication Monitoring (ICD 10: Z79.899) Intervals on ECG acceptable for amiodarone  monitoring.   Chronic HFpEF EF 65-70% GDMT per primary cardiology team Fluid status appears stable today  CAD Chronic total occlusion of distal RCA and LAD No anginal symptoms Followed by Dr Arthea Larsson  HTN Elevated today. Patient will go to pharmacy to pick up valsartan  160 mg daily and discontinue losartan .  Check bmet in 2-3 weeks with new valsartan  start    Follow up with Cadence Furth as scheduled. AF clinic in 2 months.        Myrtha Ates  PA-C [facility] [address] [place], [place] [address] [phone]

## 2024-03-08 ENCOUNTER — Encounter (HOSPITAL_BASED_OUTPATIENT_CLINIC_OR_DEPARTMENT_OTHER): Payer: Self-pay

## 2024-03-08 DIAGNOSIS — E876 Hypokalemia: Secondary | ICD-10-CM

## 2024-03-10 NOTE — Telephone Encounter (Signed)
 Called results to patient and left results on VM (ok per DPR), instructions left to call office back if patient has any questions! Labs ordered.

## 2024-03-15 ENCOUNTER — Encounter: Payer: Self-pay | Admitting: Internal Medicine

## 2024-03-15 NOTE — Telephone Encounter (Signed)
 Both rx were sent on 03/01/2024

## 2024-03-17 ENCOUNTER — Telehealth: Payer: Self-pay

## 2024-03-17 DIAGNOSIS — G4733 Obstructive sleep apnea (adult) (pediatric): Secondary | ICD-10-CM

## 2024-03-17 NOTE — Telephone Encounter (Unsigned)
 Copied from CRM [id number]. Topic: General - Other >> Mar 15, 2024  3:35 PM Eveleen Hinds B wrote: Reason for CRM: [facility].  Patient called for a new mask and [facility] will Recent face to face notes and signed written order. Phone: [phone] FAX: [phone]

## 2024-03-18 ENCOUNTER — Other Ambulatory Visit: Payer: Self-pay

## 2024-03-18 MED ORDER — AMIODARONE HCL 200 MG PO TABS: 200.0000 mg | ORAL_TABLET | Freq: Every day | ORAL | 0 refills | Status: DC

## 2024-03-18 MED ORDER — APIXABAN 5 MG PO TABS: 5.0000 mg | ORAL_TABLET | Freq: Two times a day (BID) | ORAL | 0 refills | Status: DC

## 2024-03-18 NOTE — Telephone Encounter (Signed)
 Refills sent

## 2024-03-21 NOTE — Telephone Encounter (Signed)
 Copied from CRM [id number]. Topic: General - Other >> Mar 18, 2024  2:45 PM Margarette Shawl wrote: Rosanne Commodore, with [facility] is contacting to clinic, due to patient recently changing DME services and is wanting to order new CPAP supplies.   Requesting: Most recent OV note, prescription, and sleep study results   FX# [phone]  PH# [phone]

## 2024-03-21 NOTE — Telephone Encounter (Signed)
 CPAP supply order placed with [facility].

## 2024-03-25 ENCOUNTER — Other Ambulatory Visit (HOSPITAL_COMMUNITY): Payer: Self-pay | Admitting: *Deleted

## 2024-03-25 MED ORDER — APIXABAN 5 MG PO TABS: 5.0000 mg | ORAL_TABLET | Freq: Two times a day (BID) | ORAL | 6 refills | Status: DC

## 2024-03-25 MED ORDER — AMIODARONE HCL 200 MG PO TABS: 200.0000 mg | ORAL_TABLET | Freq: Every day | ORAL | 1 refills | Status: DC

## 2024-03-28 ENCOUNTER — Telehealth (HOSPITAL_COMMUNITY): Payer: Self-pay | Admitting: *Deleted

## 2024-03-28 MED ORDER — WARFARIN SODIUM 5 MG PO TABS: 5.0000 mg | ORAL_TABLET | Freq: Every day | ORAL | 0 refills | Status: DC

## 2024-03-28 NOTE — Telephone Encounter (Signed)
 Patient called in stating he has been without Eliquis  for 5 days as the copay is over $300 and he cannot afford this. He spoke with his drug insurance and the cost of coumadin (warfarin) is free to patient. He would prefer this option. Discussed with Myrtha Ates PA will stop Eliquis  (pt has not had this in 5 days) and start Coumadin 5mg  once a day with follow up in coumadin clinic for management.   Pt also reported continued elevated BP since last visit. Patient blood pressure managed by C. Walker NP at [facility] - instructed pt to contact her office for further blood pressure management. Pt verbalized understanding and will await call from Coumadin clinic for management.

## 2024-03-29 ENCOUNTER — Telehealth (HOSPITAL_BASED_OUTPATIENT_CLINIC_OR_DEPARTMENT_OTHER): Payer: Self-pay | Admitting: Family

## 2024-03-29 NOTE — Telephone Encounter (Signed)
 Called pt to schedule coumadin appt.  No answer.  LMOM for pt to call be back.  Appt made for 5/20 at 1:15pm.

## 2024-03-29 NOTE — Telephone Encounter (Signed)
 Note reviewed from AFib clinic phone encounter today. He reported elevated BP and was instructed to contact our office. Has upcoming follow up 04/08/24 in [place]. At last visit Losartan  stopped, Valsartan  160mg  daily initiated. Please ensure he is taking regularly. He is also due for BMET which has been ordered. Please ask him to have labs today or tomorrow and can make changes based on lab results.   Phil Corti S Kasheena Sambrano, NP

## 2024-03-29 NOTE — Telephone Encounter (Signed)
 Left message for patient to call back

## 2024-03-30 NOTE — Telephone Encounter (Signed)
 Called back.  Still no answer.  LM on voice mail that INR appt has been made for 5/20 at 1:15pm.

## 2024-03-31 NOTE — Telephone Encounter (Signed)
 2nd call attempt, no answer, left message to call back

## 2024-04-01 DIAGNOSIS — E119 Type 2 diabetes mellitus without complications: Secondary | ICD-10-CM | POA: Diagnosis not present

## 2024-04-01 DIAGNOSIS — E039 Hypothyroidism, unspecified: Secondary | ICD-10-CM | POA: Diagnosis not present

## 2024-04-01 NOTE — Telephone Encounter (Signed)
 Left message for patient to call back

## 2024-04-01 NOTE — Telephone Encounter (Signed)
 3rd call attempt, reviewed BP recommendations with pt. He states BP is running over 200s/100s at home when taking the valsartan . He states he has been taking for a week now. Yesterday it was 179/103 184/103. He checks blood pressure about 2-3 hours after his medications,  204/103 204/105, and he got a new cuff and PT checked it for him.   Reviewed upcoming appointments with pt and locations- also advised pt on locations to have labs drawn.

## 2024-04-01 NOTE — Telephone Encounter (Signed)
 Recommend addition of Hydralazine 50mg  TID to help lower BP.   Robert Brown S Azaylah Stailey, NP

## 2024-04-02 LAB — LIPID PANEL
Chol/HDL Ratio: 3.6 ratio (ref 0.0–5.0)
Cholesterol, Total: 175 mg/dL (ref 100–199)
HDL: 49 mg/dL (ref >39)
LDL Chol Calc (NIH): 105 mg/dL — ABNORMAL HIGH (ref 0–99)
Triglycerides: 118 mg/dL (ref 0–149)
VLDL Cholesterol Cal: 21 mg/dL (ref 5–40)

## 2024-04-02 LAB — COMPREHENSIVE METABOLIC PANEL WITH GFR
ALT: 45 IU/L — ABNORMAL HIGH (ref 0–44)
AST: 32 IU/L (ref 0–40)
Albumin: 4 g/dL (ref 3.8–4.8)
Alkaline Phosphatase: 116 IU/L (ref 44–121)
BUN/Creatinine Ratio: 14 (ref 10–24)
BUN: 19 mg/dL (ref 8–27)
Bilirubin Total: 0.3 mg/dL (ref 0.0–1.2)
CO2: 27 mmol/L (ref 20–29)
Calcium: 9.2 mg/dL (ref 8.6–10.2)
Chloride: 103 mmol/L (ref 96–106)
Creatinine, Ser: 1.35 mg/dL — ABNORMAL HIGH (ref 0.76–1.27)
Globulin, Total: 2.5 g/dL (ref 1.5–4.5)
Glucose: 91 mg/dL (ref 70–99)
Potassium: 3.5 mmol/L (ref 3.5–5.2)
Sodium: 145 mmol/L — ABNORMAL HIGH (ref 134–144)
Total Protein: 6.5 g/dL (ref 6.0–8.5)
eGFR: 56 mL/min/{1.73_m2} — ABNORMAL LOW (ref >59)

## 2024-04-02 LAB — T4, FREE: Free T4: 1.61 ng/dL (ref 0.82–1.77)

## 2024-04-02 LAB — TSH: TSH: 5.11 u[IU]/mL — ABNORMAL HIGH (ref 0.450–4.500)

## 2024-04-04 MED ORDER — HYDRALAZINE HCL 50 MG PO TABS: 50.0000 mg | ORAL_TABLET | Freq: Three times a day (TID) | ORAL | 3 refills | Status: AC

## 2024-04-04 NOTE — Addendum Note (Signed)
 Addended by: Guss Legacy on: 04/04/2024 10:34 AM   Modules accepted: Orders

## 2024-04-04 NOTE — Telephone Encounter (Signed)
 2nd call attempt, recommendations reviewed with patient- rx to pharmacy.

## 2024-04-05 ENCOUNTER — Ambulatory Visit: Attending: Internal Medicine | Admitting: *Deleted

## 2024-04-05 DIAGNOSIS — Z5181 Encounter for therapeutic drug level monitoring: Secondary | ICD-10-CM | POA: Insufficient documentation

## 2024-04-05 DIAGNOSIS — I480 Paroxysmal atrial fibrillation: Secondary | ICD-10-CM | POA: Diagnosis not present

## 2024-04-05 DIAGNOSIS — I4891 Unspecified atrial fibrillation: Secondary | ICD-10-CM

## 2024-04-05 LAB — POCT INR: INR: 2.7 (ref 2.0–3.0)

## 2024-04-05 NOTE — Patient Instructions (Signed)
Continue warfarin 5 mg daily.     Recheck INR in 1 week.

## 2024-04-06 ENCOUNTER — Encounter: Payer: Self-pay | Admitting: Nurse Practitioner

## 2024-04-06 ENCOUNTER — Ambulatory Visit: Payer: Medicare Other | Admitting: Nurse Practitioner

## 2024-04-06 VITALS — BP 138/70 | HR 70 | Ht 70.0 in | Wt 217.4 lb

## 2024-04-06 DIAGNOSIS — Z7984 Long term (current) use of oral hypoglycemic drugs: Secondary | ICD-10-CM | POA: Diagnosis not present

## 2024-04-06 DIAGNOSIS — E039 Hypothyroidism, unspecified: Secondary | ICD-10-CM | POA: Diagnosis not present

## 2024-04-06 DIAGNOSIS — E119 Type 2 diabetes mellitus without complications: Secondary | ICD-10-CM

## 2024-04-06 DIAGNOSIS — E559 Vitamin D deficiency, unspecified: Secondary | ICD-10-CM | POA: Diagnosis not present

## 2024-04-06 DIAGNOSIS — Z7985 Long-term (current) use of injectable non-insulin antidiabetic drugs: Secondary | ICD-10-CM | POA: Diagnosis not present

## 2024-04-06 MED ORDER — LEVOTHYROXINE SODIUM 88 MCG PO TABS: 88.0000 ug | ORAL_TABLET | Freq: Every morning | ORAL | 3 refills | Status: DC

## 2024-04-06 NOTE — Progress Notes (Signed)
 Endocrinology Follow Up Note       04/06/2024, 3:45 PM   Subjective:    Patient ID: Robert Brown, male    DOB: 29-May-1951.  Robert Brown is being seen in follow up after being seen in consultation for management of currently uncontrolled symptomatic diabetes requested by  Tobi Fortes, MD.   Past Medical History:  Diagnosis Date   CAD (coronary artery disease)    Cancer (HCC)    Diabetes mellitus without complication (HCC)    Hypertension    Stroke [facility])     Past Surgical History:  Procedure Laterality Date   bone grafts     TUMOR EXCISION      Social History   Socioeconomic History   Marital status: Married    Spouse name: Not on file   Number of children: Not on file   Years of education: Not on file   Highest education level: Not on file  Occupational History   Not on file  Tobacco Use   Smoking status: Former    Current packs/day: 0.00    Types: Cigarettes    Quit date: 11/17/2006    Years since quitting: 17.3   Smokeless tobacco: Never   Tobacco comments:    Former smoker 03/07/24  Vaping Use   Vaping status: Never Used  Substance and Sexual Activity   Alcohol  use: Not Currently    Comment: former drinker   Drug use: Never   Sexual activity: Not Currently  Other Topics Concern   Not on file  Social History Narrative   Not on file   Social Drivers of Health   Financial Resource Strain: High Risk (02/27/2022)   Received from [facility], [facility]   Overall Financial Resource Strain (CARDIA)    Difficulty of Paying Living Expenses: Hard  Food Insecurity: No Food Insecurity (02/16/2024)   Hunger Vital Sign    Worried About Running Out of Food in the Last Year: Never true    Ran Out of Food in the Last Year: Never true  Transportation Needs: No Transportation Needs (02/16/2024)   PRAPARE - Administrator, Civil Service (Medical): No    Lack of  Transportation (Non-Medical): No  Physical Activity: Insufficiently Active (02/27/2022)   Received from [facility], [facility]   Exercise Vital Sign    Days of Exercise per Week: 1 day    Minutes of Exercise per Session: 60 min  Stress: Stress Concern Present (02/27/2022)   Received from [facility], [facility] of Occupational Health - Occupational Stress Questionnaire    Feeling of Stress : To some extent  Social Connections: Socially Integrated (02/16/2024)   Social Connection and Isolation Panel [NHANES]    Frequency of Communication with Friends and Family: More than three times a week    Frequency of Social Gatherings with Friends and Family: Never    Attends Religious Services: More than 4 times per year    Active Member of [employer] or Organizations: Yes    Attends Engineer, structural: More than 4 times per year    Marital Status: Married    Family History  Problem Relation  Age of Onset   Diabetes Mother    Diabetes Father     Outpatient Encounter Medications as of 04/06/2024  Medication Sig   albuterol  (PROVENTIL ) (2.5 MG/3ML) 0.083% nebulizer solution Take 3 mLs (2.5 mg total) by nebulization every 6 (six) hours as needed for wheezing or shortness of breath.   albuterol  (VENTOLIN  HFA) 108 (90 Base) MCG/ACT inhaler Inhale 1-2 puffs into the lungs every 6 (six) hours as needed for shortness of breath or wheezing.   amiodarone  (PACERONE ) 200 MG tablet Take 1 tablet (200 mg total) by mouth daily.   Blood Glucose Monitoring Suppl (ACCU-CHEK GUIDE) w/Device KIT Use to check glucose once daily.   busPIRone  (BUSPAR ) 5 MG tablet Take 5 mg by mouth daily as needed (for anxiety).   carvedilol  (COREG ) 25 MG tablet Take 25 mg by mouth 2 (two) times daily.   Continuous Glucose Sensor (FREESTYLE LIBRE 3 PLUS SENSOR) MISC Change sensor every 15 days.   doxycycline  (VIBRAMYCIN ) 50 MG capsule Take 50 mg by mouth daily.   ezetimibe  (ZETIA ) 10 MG tablet Take 1  tablet (10 mg total) by mouth daily.   finasteride  (PROSCAR ) 5 MG tablet Take 5 mg by mouth daily.   Fluticasone -Umeclidin-Vilant (TRELEGY ELLIPTA ) 100-62.5-25 MCG/ACT AEPB One click each am (Patient taking differently: Take 1 puff by mouth daily as needed (for shortness of breath).)   hydrALAZINE  (APRESOLINE ) 50 MG tablet Take 1 tablet (50 mg total) by mouth 3 (three) times daily.   isosorbide  mononitrate (IMDUR ) 120 MG 24 hr tablet Take 1 tablet (120 mg total) by mouth daily.   nitroGLYCERIN  (NITROSTAT ) 0.4 MG SL tablet Place 1 tablet (0.4 mg total) under the tongue every 5 (five) minutes as needed for chest pain. If a single episode of chest pain is not relieved by one tablet, the patient will try another within 5 minutes; and if this doesn't relieve the pain, the patient is instructed to call 911 for transportation to an emergency department.   ondansetron  (ZOFRAN -ODT) 4 MG disintegrating tablet Take 1 tablet (4 mg total) by mouth every 8 (eight) hours as needed for nausea or vomiting.   rosuvastatin  (CRESTOR ) 40 MG tablet Take 1 tablet (40 mg total) by mouth daily.   Semaglutide , 1 MG/DOSE, (OZEMPIC , 1 MG/DOSE,) 2 MG/1.5ML SOPN Inject 1 mg into the skin every Tuesday.   valsartan  (DIOVAN ) 160 MG tablet Take 1 tablet (160 mg total) by mouth daily.   warfarin (COUMADIN ) 5 MG tablet Take 1 tablet (5 mg total) by mouth daily.   [DISCONTINUED] levothyroxine  (SYNTHROID ) 88 MCG tablet TAKE 1 TABLET BY MOUTH EVERY  MORNING   [DISCONTINUED] metFORMIN  (GLUCOPHAGE ) 500 MG tablet Take 1 tablet (500 mg total) by mouth 2 (two) times daily.   levothyroxine  (SYNTHROID ) 88 MCG tablet Take 1 tablet (88 mcg total) by mouth every morning.   tamsulosin  (FLOMAX ) 0.4 MG CAPS capsule Take 0.4 mg by mouth 2 (two) times daily.   No facility-administered encounter medications on file as of 04/06/2024.    ALLERGIES: Allergies  Allergen Reactions   Alcohol  Other (See Comments)    Prefers not to drink   Lisinopril      Medicines ending in -pril - unknown reaction   Statins     myalgia    VACCINATION STATUS: Immunization History  Administered Date(s) Administered   Fluad Quad(high Dose 65+) 09/10/2020   Fluad Trivalent(High Dose 65+) 10/13/2023   Influenza, High Dose Seasonal PF 09/06/2018, 10/11/2019, 08/28/2021   PFIZER Comirnaty(Gray Top)Covid-19 Tri-Sucrose Vaccine 03/12/2020, 04/06/2020,  10/22/2020, 04/01/2021   PFIZER(Purple Top)SARS-COV-2 Vaccination 03/12/2020, 04/06/2020, 10/22/2020   Pfizer Covid-19 Vaccine Bivalent Booster 73yrs & up 09/04/2021   Pneumococcal Conjugate-13 06/18/2015   Pneumococcal Polysaccharide-23 06/23/2017, 06/23/2017   Tdap 02/22/2020    Diabetes He presents for his follow-up diabetic visit. He has type 2 diabetes mellitus. His disease course has been improving. There are no hypoglycemic associated symptoms. There are no diabetic associated symptoms. There are no hypoglycemic complications. Symptoms are stable. Diabetic complications include a CVA, heart disease (CAD and CHF), nephropathy and peripheral neuropathy. Risk factors for coronary artery disease include diabetes mellitus, dyslipidemia, family history, obesity, male sex, hypertension and sedentary lifestyle. Current diabetic treatment includes oral agent (monotherapy) (and Ozempic ). He is compliant with treatment most of the time. His weight is fluctuating minimally. He is following a generally healthy diet. When asked about meal planning, he reported none. He has not had a previous visit with a dietitian. He rarely participates in exercise. His home blood glucose trend is decreasing steadily. His overall blood glucose range is 110-130 mg/dl. (He presents today with his CGM showing at goal glycemic profile overall.  His most recent A1c on 4/1 (during hospitalization for MI) was 6.6%, improving from last visit of 7.1%.  He denies any significant hypoglycemia.   Analysis of his CGM shows TIR 93%, TAR 7%, TBR 0%.  He has  been alternating doses of Ozempic  from 0.5 (had some left over from previous shipment) and the 1 mg dose.  ) An ACE inhibitor/angiotensin II receptor blocker is being taken. He does not see a podiatrist.Eye exam is current.     Review of systems  Constitutional: +decreasing body weight, current Body mass index is 31.19 kg/m., no fatigue, no subjective hyperthermia, no subjective hypothermia Eyes: no blurry vision, no xerophthalmia ENT: no sore throat, no nodules palpated in throat, no dysphagia/odynophagia, no hoarseness Cardiovascular: no chest pain, no shortness of breath, no palpitations, no leg swelling Respiratory: no cough, no shortness of breath Gastrointestinal: no nausea/vomiting/diarrhea Musculoskeletal: no muscle/joint aches Skin: no rashes, no hyperemia Neurological: no tremors, no numbness, no tingling, no dizziness Psychiatric: no depression, no anxiety  Objective:     BP 138/70 (BP Location: Left Arm, Patient Position: Sitting, Cuff Size: Large)   Pulse 70   Ht 5\' 10"  (1.778 m)   Wt 217 lb 6.4 oz (98.6 kg)   BMI 31.19 kg/m   Wt Readings from Last 3 Encounters:  04/06/24 217 lb 6.4 oz (98.6 kg)  03/07/24 223 lb (101.2 kg)  03/03/24 223 lb (101.2 kg)     BP Readings from Last 3 Encounters:  04/06/24 138/70  03/07/24 (!) 170/110  03/03/24 (!) 163/67      Physical Exam- Limited  Constitutional:  Body mass index is 31.19 kg/m. , not in acute distress, normal state of mind Eyes:  EOMI, no exophthalmos Musculoskeletal: no gross deformities, strength intact in all four extremities, no gross restriction of joint movements Skin:  no rashes, no hyperemia Neurological: no tremor with outstretched hands   Diabetic Foot Exam - Simple   No data filed      CMP ( most recent) CMP     Component Value Date/Time   NA 145 (H) 04/01/2024 1521   K 3.5 04/01/2024 1521   CL 103 04/01/2024 1521   CO2 27 04/01/2024 1521   GLUCOSE 91 04/01/2024 1521   GLUCOSE 114  (H) 03/07/2024 1434   BUN 19 04/01/2024 1521   CREATININE 1.35 (H) 04/01/2024 1521  CALCIUM  9.2 04/01/2024 1521   PROT 6.5 04/01/2024 1521   ALBUMIN 4.0 04/01/2024 1521   AST 32 04/01/2024 1521   ALT 45 (H) 04/01/2024 1521   ALKPHOS 116 04/01/2024 1521   BILITOT 0.3 04/01/2024 1521   GFRNONAA 56 (L) 03/07/2024 1434   GFRAA >60 06/21/2020 1510     Diabetic Labs (most recent): Lab Results  Component Value Date   HGBA1C 6.6 (H) 02/16/2024   HGBA1C 9.4 (A) 12/08/2023   HGBA1C 6.9 (A) 08/31/2023   MICROALBUR 30mg /L 08/31/2023     Lipid Panel ( most recent) Lipid Panel     Component Value Date/Time   CHOL 175 04/01/2024 1521   TRIG 118 04/01/2024 1521   HDL 49 04/01/2024 1521   CHOLHDL 3.6 04/01/2024 1521   LDLCALC 105 (H) 04/01/2024 1521   LABVLDL 21 04/01/2024 1521      Lab Results  Component Value Date   TSH 5.110 (H) 04/01/2024   TSH 3.560 02/26/2024   TSH 6.043 (H) 02/15/2024   TSH 3.110 08/24/2023   TSH 3.96 08/04/2022   FREET4 1.61 04/01/2024   FREET4 1.62 02/26/2024   FREET4 0.92 02/15/2024   FREET4 1.33 08/24/2023           Assessment & Plan:   1) Type 2 Diabetes mellitus without complication without long-term current use of insulin   He presents today with his CGM showing at goal glycemic profile overall.  His most recent A1c on 4/1 (during hospitalization for MI) was 6.6%, improving from last visit of 7.1%.  He denies any significant hypoglycemia.   Analysis of his CGM shows TIR 93%, TAR 7%, TBR 0%.  He has been alternating doses of Ozempic  from 0.5 (had some left over from previous shipment) and the 1 mg dose.    - Robert Brown has currently uncontrolled symptomatic type 2 DM since 73 years of age.   -Recent labs reviewed.  - I had a long discussion with him about the progressive nature of diabetes and the pathology behind its complications. -his diabetes is complicated by CAD with MI, CVA, CKD stage 2, neuropathy and he remains at a high  risk for more acute and chronic complications which include CAD, CVA, CKD, retinopathy, and neuropathy. These are all discussed in detail with him.  The following Lifestyle Medicine recommendations according to American College of Lifestyle Medicine [facility]) were discussed and offered to patient and he agrees to start the journey:  A. Whole Foods, Plant-based plate comprising of fruits and vegetables, plant-based proteins, whole-grain carbohydrates was discussed in detail with the patient.   A list for source of those nutrients were also provided to the patient.  Patient will use only water or unsweetened tea for hydration. B.  The need to stay away from risky substances including alcohol , smoking; obtaining 7 to 9 hours of restorative sleep, at least 150 minutes of moderate intensity exercise weekly, the importance of healthy social connections,  and stress reduction techniques were discussed. C.  A full color page of  Calorie density of various food groups per pound showing examples of each food groups was provided to the patient.  - Nutritional counseling repeated at each appointment due to patients tendency to fall back in to old habits.  - The patient admits there is a room for improvement in their diet and drink choices. -  Suggestion is made for the patient to avoid simple carbohydrates from their diet including Cakes, Sweet Desserts / Pastries, Ice Cream, Soda (diet and  regular), Sweet Tea, Candies, Chips, Cookies, Sweet Pastries, Store Bought Juices, Alcohol  in Excess of 1-2 drinks a day, Artificial Sweeteners, Coffee Creamer, and "Sugar-free" Products. This will help patient to have stable blood glucose profile and potentially avoid unintended weight gain.   - I encouraged the patient to switch to unprocessed or minimally processed complex starch and increased protein intake (animal or plant source), fruits, and vegetables.   - Patient is advised to stick to a routine mealtimes to eat 3 meals a  day and avoid unnecessary snacks (to snack only to correct hypoglycemia).  - I have approached him with the following individualized plan to manage his diabetes and patient agrees:   -He is advised to continue Ozempic  1 mg SQ weekly.  Will stop his Metformin  today given CKD stage 3 to see if that improves his GI symptoms and kidney function.  He could most certainly benefit from GLP1 product given his extensive CAD history with MI and CVA in the past.  -he is encouraged to continue monitoring glucose once daily daily, before breakfast, and to call the clinic if he had readings less than 70 or above 300 for 3 tests in a row.  I will send in for upgrade to [facility] 3 for him, Jerrilyn Moras 2 products will be discontinued come September.  - he is warned not to take insulin  without proper monitoring per orders. - Adjustment parameters are given to him for hypo and hyperglycemia in writing.  - he is not a candidate for full dose Metformin  due to concurrent renal insufficiency.  - Specific targets for  A1c; LDL, HDL, and Triglycerides were discussed with the patient.  2) Blood Pressure /Hypertension:  his blood pressure is controlled to target.   he is advised to continue his current medications as prescribed by PCP/cardiology.  3) Lipids/Hyperlipidemia:    Review of his recent lipid panel from 04/01/24 showed uncontrolled LDL at 105 (improving).  he is advised to continue Crestor  20 mg daily at bedtime.  Side effects and precautions discussed with him.  His cardiologist is looking in to getting an injectable for him through PAP.  4)  Weight/Diet:  his Body mass index is 31.19 kg/m.  -  clearly complicating his diabetes care.   he is a candidate for weight loss. I discussed with him the fact that loss of 5 - 10% of his  current body weight will have the most impact on his diabetes management.  Exercise, and detailed carbohydrates information provided  -  detailed on discharge instructions.  5)  Hypothyroidism-unspecified The details surrounding his diagnosis are not available.  He denies any known family history of thyroid  problems.   His previsit TFTs are consistent with appropriate hormone replacement (TSH is high but Free T4 is normal).  He is also on Amiodarone  for Afib which is known to throw off TSH.  He is advised to continue Levothyroxine  88 mcg po daily before breakfast.  Will recheck TFTs prior to next visit and adjust dose accordingly.   - The correct intake of thyroid  hormone (Levothyroxine , Synthroid ), is on empty stomach first thing in the morning, with water, separated by at least 30 minutes from breakfast and other medications,  and separated by more than 4 hours from calcium , iron, multivitamins, acid reflux medications (PPIs).  - This medication is a life-long medication and will be needed to correct thyroid  hormone imbalances for the rest of your life.  The dose may change from time to time, based on thyroid  blood  work.  - It is extremely important to be consistent taking this medication, near the same time each morning.  -AVOID TAKING PRODUCTS CONTAINING BIOTIN (commonly found in Hair, Skin, Nails vitamins) AS IT INTERFERES WITH THE VALIDITY OF THYROID  FUNCTION BLOOD TESTS.  6) Chronic Care/Health Maintenance: -he is on ACEI/ARB and Statin medications and is encouraged to initiate and continue to follow up with Ophthalmology, Dentist, Podiatrist at least yearly or according to recommendations, and advised to stay away from smoking. I have recommended yearly flu vaccine and pneumonia vaccine at least every 5 years; moderate intensity exercise for up to 150 minutes weekly; and sleep for at least 7 hours a day.  - he is advised to maintain close follow up with Kermit Ped, Heath Litten, MD for primary care needs, as well as his other providers for optimal and coordinated care.     I spent  41  minutes in the care of the patient today including review of labs from CMP, Lipids,  Thyroid  Function, Hematology (current and previous including abstractions from other facilities); face-to-face time discussing  his blood glucose readings/logs, discussing hypoglycemia and hyperglycemia episodes and symptoms, medications doses, his options of short and long term treatment based on the latest standards of care / guidelines;  discussion about incorporating lifestyle medicine;  and documenting the encounter. Risk reduction counseling performed per USPSTF guidelines to reduce obesity and cardiovascular risk factors.     Please refer to Patient Instructions for Blood Glucose Monitoring and Insulin /Medications Dosing Guide"  in media tab for additional information. Please  also refer to " Patient Self Inventory" in the Media  tab for reviewed elements of pertinent patient history.  Robert Brown participated in the discussions, expressed understanding, and voiced agreement with the above plans.  All questions were answered to his satisfaction. he is encouraged to contact clinic should he have any questions or concerns prior to his return visit.     Follow up plan: - Return in about 4 months (around 08/07/2024) for Diabetes F/U with A1c in office, Previsit labs, Thyroid  follow up, Bring meter and logs.  Hulon Magic, [facility] [address] [place], [place] [address] Phone: [phone] Fax: [phone]  04/06/2024, 3:45 PM

## 2024-04-08 ENCOUNTER — Ambulatory Visit: Admitting: Medical

## 2024-04-12 ENCOUNTER — Telehealth: Payer: Self-pay | Admitting: Internal Medicine

## 2024-04-12 ENCOUNTER — Telehealth: Payer: Self-pay

## 2024-04-12 ENCOUNTER — Other Ambulatory Visit (HOSPITAL_COMMUNITY): Payer: Self-pay

## 2024-04-12 NOTE — Telephone Encounter (Signed)
 Pt needs a Prior Quarry manager for [place] 3.

## 2024-04-12 NOTE — Telephone Encounter (Unsigned)
 Copied from CRM [id number]. Topic: Clinical - Order For Equipment >> Apr 12, 2024  4:26 PM Corean Deutscher wrote: Reason for CRM: Patient called regarding the order for his Cpap machine. Patient stated he spoke with someone at [facility] and they stated they did not receive an order for his cpap machine, patient frustrated and would like to be contacted via phone and MyChart once [facility] has received order for CPAP machine.

## 2024-04-12 NOTE — Telephone Encounter (Signed)
 Pharmacy Patient Advocate Encounter   Received notification from Pt Calls Messages that prior authorization for Freestyle libre 3 plus is required/requested.   Insurance verification completed.   The patient is insured through [place] .   For CGM approval, patients are required to be on multiple daily insulin  injections or using an insulin  pump, OR have a documented history of severe hypoglycemic episodes. Based on the available information, the patient does not appear to meet this criteria at this time. Please advise.

## 2024-04-13 ENCOUNTER — Telehealth: Payer: Self-pay | Admitting: Nurse Practitioner

## 2024-04-13 ENCOUNTER — Ambulatory Visit: Attending: Internal Medicine | Admitting: *Deleted

## 2024-04-13 DIAGNOSIS — Z5181 Encounter for therapeutic drug level monitoring: Secondary | ICD-10-CM | POA: Diagnosis not present

## 2024-04-13 DIAGNOSIS — I480 Paroxysmal atrial fibrillation: Secondary | ICD-10-CM

## 2024-04-13 LAB — POCT INR: INR: 7 — AB (ref 2.0–3.0)

## 2024-04-13 NOTE — Telephone Encounter (Signed)
 Sent to [place] at [facility] to send to [facility]

## 2024-04-13 NOTE — Telephone Encounter (Signed)
 Patient was called. His voicemail box is full and unable to leave voice message. Will attempt to call later.

## 2024-04-13 NOTE — Patient Instructions (Signed)
 Hold warfarin x 3 days then decrease dose to 1/2 tablet daily except 1 tablet on Mondays, Wednesdays and Fridays Recheck INR in 1 week

## 2024-04-13 NOTE — Telephone Encounter (Signed)
 Can you let the patient know his CGM will no longer be approved as he is no longer on insulin ?  It isn't ideal, but he is on safe medication regimen and will not need to routinely monitor glucose at this time.

## 2024-04-13 NOTE — Telephone Encounter (Signed)
 Per Robert Brown at [facility]- What day was the order fro the machine writen? Last two orders we have received are both for rmask and supplies.  I will need an order for the new pap unit to send in for review.  Please advise.

## 2024-04-15 NOTE — Telephone Encounter (Signed)
 You can give him information about Stelo which is similar to the Dexcom.  Unfortunately, I cannot change the insurances minds on this one.

## 2024-04-15 NOTE — Telephone Encounter (Signed)
 Patient was called and made aware. He was not happy about this. I told him that he could check his blood sugars by sticking his finger,using his meter. Patient states that he does not want to do this as he has to prick his finger for the warfarin and he will not stick twice.

## 2024-04-15 NOTE — Telephone Encounter (Signed)
 I don't see mention in my note about a new CPAP. I did send an order to change the settings and mask. IF his machine is >73 years old, ok to send new order with current settings. Thanks.

## 2024-04-19 NOTE — Telephone Encounter (Signed)
 Patient doesn't need a new CPAP machine, but does need new CPAP supplies and a mask of choice, order was placed. Called [facility] to know why pt is yet to receive this. No answer, left  a detailed message. Will call back tomorrow.

## 2024-04-20 ENCOUNTER — Ambulatory Visit: Attending: Internal Medicine | Admitting: *Deleted

## 2024-04-20 ENCOUNTER — Telehealth: Payer: Self-pay | Admitting: Internal Medicine

## 2024-04-20 ENCOUNTER — Telehealth: Payer: Self-pay | Admitting: Student

## 2024-04-20 DIAGNOSIS — Z5181 Encounter for therapeutic drug level monitoring: Secondary | ICD-10-CM | POA: Diagnosis not present

## 2024-04-20 DIAGNOSIS — I480 Paroxysmal atrial fibrillation: Secondary | ICD-10-CM

## 2024-04-20 LAB — POCT INR: INR: 4 — AB (ref 2.0–3.0)

## 2024-04-20 NOTE — Telephone Encounter (Signed)
 CPAP company will fax order to [facility] office. Please fax to [facility] for Dr. Jacqui Mau signature.

## 2024-04-20 NOTE — Telephone Encounter (Signed)
 Sarah from [facility] said she did not get DME order for pts CPAP supplies. She is going to fax over a standard written order for us  to fill out. On the other hand, I will fax over the DME order to Attn sarah at [phone].  I got another message from [place] stating  patient should call [facility],  due to insurance change and not being able to reach patient. They were unable to process his order. Called patient and relayed message to him and also sent him a MyChart message to call them directly. NFN

## 2024-04-20 NOTE — Patient Instructions (Signed)
 Hold warfarin tonight then decrease dose to 1/2 tablet daily  Recheck INR in 1 week

## 2024-04-20 NOTE — Telephone Encounter (Signed)
 Copied from CRM [id number]. Topic: Clinical - Order For Equipment >> Apr 12, 2024  4:26 PM Corean Deutscher wrote: Reason for CRM: Patient called regarding the order for his Cpap machine. Patient stated he spoke with someone at [facility] and they stated they did not receive an order for his cpap machine, patient frustrated and would like to be contacted via phone and MyChart once [facility] has received order for CPAP machine. >> Apr 20, 2024  9:17 AM Evie Hoff wrote: Isa Manuel from [facility] is calling back phones disconnected wanted to speak back to the nurse about patients equipment

## 2024-04-20 NOTE — Telephone Encounter (Signed)
 Robert Brown from [employer] is ret Robert Brown's call. States the supply order sent in for him needs information. The order was sent to [facility]. Looks like insurance has changed but the PT is on a do not call/do not text list so they can not call him. Direct # is [phone]. They need to speak to him to ask him a few questions. WO speaking with him they can not process. Thanks.

## 2024-04-20 NOTE — Telephone Encounter (Signed)
 There is no order for a CPAP machine. There are orders for CPAP Supplies + Mask of Choice. Patient's CPAP must be 5 years or older to receive a new CPAP.

## 2024-04-22 ENCOUNTER — Telehealth: Payer: Self-pay

## 2024-04-22 NOTE — Telephone Encounter (Signed)
 Copied from CRM [id number]. Topic: General - Call Back - No Documentation >> Apr 20, 2024  4:41 PM Corean Deutscher wrote: Reason for CRM: Patient returning [place] phone call.  ATC x1 unable to leave VM on patient's phone mailbox is full . Will send patient a Mychart message .

## 2024-04-22 NOTE — Telephone Encounter (Signed)
 Copied from CRM [id number]. Topic: General - Call Back - No Documentation >> Apr 20, 2024  4:41 PM Robert Brown wrote: Reason for CRM: Patient returning [place] phone call.

## 2024-04-26 ENCOUNTER — Ambulatory Visit: Attending: Internal Medicine | Admitting: *Deleted

## 2024-04-26 DIAGNOSIS — Z5181 Encounter for therapeutic drug level monitoring: Secondary | ICD-10-CM | POA: Diagnosis not present

## 2024-04-26 DIAGNOSIS — I480 Paroxysmal atrial fibrillation: Secondary | ICD-10-CM

## 2024-04-26 LAB — POCT INR: INR: 1.3 — AB (ref 2.0–3.0)

## 2024-04-26 NOTE — Patient Instructions (Addendum)
 Pt only took 1/2 tablet of warfarin since last appt.  Said he was feeling bad and SOB.  Thinks warfarin is causing this.  Pt has agreed to try warfarin 1/2 tablet daily and recheck in 1 week. Has appt with Dr Mallipeddi on 6/12 to discuss recent symptoms.

## 2024-04-27 ENCOUNTER — Ambulatory Visit (INDEPENDENT_AMBULATORY_CARE_PROVIDER_SITE_OTHER): Admitting: Internal Medicine

## 2024-04-27 VITALS — BP 170/80 | HR 76 | Ht 70.0 in | Wt 221.8 lb

## 2024-04-27 DIAGNOSIS — Z7985 Long-term (current) use of injectable non-insulin antidiabetic drugs: Secondary | ICD-10-CM | POA: Diagnosis not present

## 2024-04-27 DIAGNOSIS — I10 Essential (primary) hypertension: Secondary | ICD-10-CM | POA: Diagnosis not present

## 2024-04-27 DIAGNOSIS — I480 Paroxysmal atrial fibrillation: Secondary | ICD-10-CM

## 2024-04-27 DIAGNOSIS — E1159 Type 2 diabetes mellitus with other circulatory complications: Secondary | ICD-10-CM | POA: Diagnosis not present

## 2024-04-27 DIAGNOSIS — E785 Hyperlipidemia, unspecified: Secondary | ICD-10-CM | POA: Diagnosis not present

## 2024-04-27 MED ORDER — VALSARTAN 320 MG PO TABS: 320.0000 mg | ORAL_TABLET | Freq: Every day | ORAL | 3 refills | Status: AC

## 2024-04-27 NOTE — Progress Notes (Signed)
 Established Patient Office Visit  Subjective   Patient ID: Robert Brown, male    DOB: [birth year]  Age: 73 y.o. MRN: [id number]  Chief Complaint  Patient presents with   Care Management    Three month follow up    Robert Brown returns to care today for routine follow-up.  He was last evaluated by me on 4/11 for hospital follow-up in the setting of recent admission for new onset A-fib with RVR.  Previously seen by me for routine follow-up on 3/6.  No medication changes were made at time, repeat labs ordered, and 83-month follow-up arranged.  In the interim, he has been evaluated by cardiology, pulmonology, and endocrinology.  There have otherwise been no acute interval events.  Today he reports feeling fairly well but expresses frustration with having to take warfarin.  He is also concerned about his blood pressure.  Past Medical History:  Diagnosis Date   CAD (coronary artery disease)    Cancer (HCC)    Diabetes mellitus without complication (HCC)    Hypertension    Stroke [facility])    Past Surgical History:  Procedure Laterality Date   bone grafts     TUMOR EXCISION     Social History   Tobacco Use   Smoking status: Former    Current packs/day: 0.00    Types: Cigarettes    Quit date: 11/17/2006    Years since quitting: 17.4   Smokeless tobacco: Never   Tobacco comments:    Former smoker 03/07/24  Vaping Use   Vaping status: Never Used  Substance Use Topics   Alcohol  use: Not Currently    Comment: former drinker   Drug use: Never   Family History  Problem Relation Age of Onset   Diabetes Mother    Diabetes Father    Allergies  Allergen Reactions   Alcohol  Other (See Comments)    Prefers not to drink   Lisinopril     Medicines ending in -pril - unknown reaction   Statins     myalgia   Review of Systems  Constitutional:  Negative for chills and fever.  HENT:  Negative for sore throat.   Respiratory:  Negative for cough and shortness of breath.    Cardiovascular:  Negative for chest pain, palpitations and leg swelling.  Gastrointestinal:  Negative for abdominal pain, blood in stool, constipation, diarrhea, nausea and vomiting.  Genitourinary:  Negative for dysuria and hematuria.  Musculoskeletal:  Negative for myalgias.  Skin:  Negative for itching and rash.  Neurological:  Negative for dizziness and headaches.  Psychiatric/Behavioral:  Negative for depression and suicidal ideas.      Objective:     BP (!) 170/80   Pulse 76   Ht 5' 10 (1.778 m)   Wt 221 lb 12.8 oz (100.6 kg)   SpO2 95%   BMI 31.82 kg/m  BP Readings from Last 3 Encounters:  04/27/24 (!) 170/80  04/06/24 138/70  03/07/24 (!) 170/110   Physical Exam Vitals reviewed.  Constitutional:      General: He is not in acute distress.    Appearance: Normal appearance. He is obese. He is not ill-appearing.  HENT:     Head: Normocephalic and atraumatic.     Right Ear: External ear normal.     Left Ear: External ear normal.     Nose: Nose normal. No congestion or rhinorrhea.     Mouth/Throat:     Mouth: Mucous membranes are moist.     Pharynx:  Oropharynx is clear.   Eyes:     General: No scleral icterus.    Extraocular Movements: Extraocular movements intact.     Conjunctiva/sclera: Conjunctivae normal.     Pupils: Pupils are equal, round, and reactive to light.    Cardiovascular:     Rate and Rhythm: Normal rate and regular rhythm.     Pulses: Normal pulses.     Heart sounds: Normal heart sounds. No murmur heard. Pulmonary:     Effort: Pulmonary effort is normal.     Breath sounds: Normal breath sounds. No wheezing, rhonchi or rales.  Abdominal:     General: Abdomen is flat. Bowel sounds are normal. There is no distension.     Palpations: Abdomen is soft.     Tenderness: There is no abdominal tenderness.   Musculoskeletal:        General: No swelling or deformity. Normal range of motion.     Cervical back: Normal range of motion.   Skin:     General: Skin is warm and dry.     Capillary Refill: Capillary refill takes less than 2 seconds.   Neurological:     General: No focal deficit present.     Mental Status: He is alert and oriented to person, place, and time.     Motor: No weakness.   Psychiatric:        Mood and Affect: Mood normal.        Behavior: Behavior normal.        Thought Content: Thought content normal.   Last CBC Lab Results  Component Value Date   WBC 9.2 02/26/2024   HGB 13.0 02/26/2024   HCT 41.0 02/26/2024   MCV 84 02/26/2024   MCH 26.7 02/26/2024   RDW 13.3 02/26/2024   PLT 156 02/26/2024   Last metabolic panel Lab Results  Component Value Date   GLUCOSE 91 04/01/2024   NA 145 (H) 04/01/2024   K 3.5 04/01/2024   CL 103 04/01/2024   CO2 27 04/01/2024   BUN 19 04/01/2024   CREATININE 1.35 (H) 04/01/2024   EGFR 56 (L) 04/01/2024   CALCIUM  9.2 04/01/2024   PHOS 3.1 02/16/2024   PROT 6.5 04/01/2024   ALBUMIN 4.0 04/01/2024   LABGLOB 2.5 04/01/2024   BILITOT 0.3 04/01/2024   ALKPHOS 116 04/01/2024   AST 32 04/01/2024   ALT 45 (H) 04/01/2024   ANIONGAP 11 03/07/2024   Last lipids Lab Results  Component Value Date   CHOL 175 04/01/2024   HDL 49 04/01/2024   LDLCALC 105 (H) 04/01/2024   TRIG 118 04/01/2024   CHOLHDL 3.6 04/01/2024   Last hemoglobin A1c Lab Results  Component Value Date   HGBA1C 6.6 (H) 02/16/2024   Last thyroid  functions Lab Results  Component Value Date   TSH 5.110 (H) 04/01/2024   Last vitamin D  Lab Results  Component Value Date   VD25OH 34.0 08/24/2023     Assessment & Plan:   Problem List Items Addressed This Visit       HTN (hypertension) - Primary (Chronic)   His blood pressure is significantly elevated today.  He has recently been switched to valsartan  160 mg daily per cardiology.  He is additionally prescribed carvedilol  25 mg twice daily, Imdur  120 mg daily, and hydralazine  50 mg 3 times daily. -Increase valsartan  to 320 mg daily.  Continue  carvedilol , Imdur , and hydralazine  as currently prescribed.      Paroxysmal atrial fibrillation (HCC)   Regular rate and rhythm  detected on exam today.  Closely followed by cardiology.  He is scheduled for follow-up tomorrow (6/12).  He remains on amiodarone  and has recently been switched to warfarin from Eliquis  due to cost.  He is frustrated with having to take warfarin and would like to switch back to a DOAC if possible.  I have recommended that he discuss his concerns with cardiology at his appointment tomorrow.      DM (diabetes mellitus), type 2 (HCC)   Followed by endocrinology.  A1c 6.6 on labs from April.  He remains on Ozempic  1 mg weekly.  No longer prescribed metformin .      HLD (hyperlipidemia) (Chronic)   Lipid panel updated last month.  Total cholesterol 175 and LDL 105.  He remains on rosuvastatin  and ezetimibe .  Per review of documentation, cardiology is attempting to start Leqvio.      Return in about 3 months (around 07/28/2024).   Tobi Fortes, MD

## 2024-04-27 NOTE — Patient Instructions (Signed)
 It was a pleasure to see you today.  Thank you for giving us  the opportunity to be involved in your care.  Below is a brief recap of your visit and next steps.  We will plan to see you again in 3 months.  Summary Increase valsartan  320 mg daily No additional medication changes Follow up in 3 months

## 2024-04-27 NOTE — Telephone Encounter (Addendum)
 I have faxed and emailed [facility] pts supply order and they are yet proccess the order. Bridgette Campus from [facility] is stating she got the order, and she will have someone named  Audrene Blessing to call me back.  Order was emailed directly to [facility] (office note, sleep study report and signed order). Fax was successful.05/02/2024.

## 2024-04-28 ENCOUNTER — Ambulatory Visit: Attending: Internal Medicine | Admitting: Internal Medicine

## 2024-04-28 ENCOUNTER — Encounter: Payer: Self-pay | Admitting: Internal Medicine

## 2024-04-28 VITALS — BP 178/88 | HR 80 | Ht 70.5 in | Wt 224.2 lb

## 2024-04-28 DIAGNOSIS — I10 Essential (primary) hypertension: Secondary | ICD-10-CM | POA: Diagnosis not present

## 2024-04-28 DIAGNOSIS — N179 Acute kidney failure, unspecified: Secondary | ICD-10-CM | POA: Diagnosis not present

## 2024-04-28 DIAGNOSIS — I480 Paroxysmal atrial fibrillation: Secondary | ICD-10-CM

## 2024-04-28 DIAGNOSIS — I701 Atherosclerosis of renal artery: Secondary | ICD-10-CM | POA: Diagnosis not present

## 2024-04-28 MED ORDER — NIFEDIPINE ER OSMOTIC RELEASE 60 MG PO TB24: 60.0000 mg | ORAL_TABLET | Freq: Every day | ORAL | 5 refills | Status: DC

## 2024-04-28 NOTE — Telephone Encounter (Signed)
 This issue haven't been resolved. Patient is still awaiting CPAP supplies. Spoke to patricia from [facility], she said they are needing Rx, office note and sleep study. Patient had sleep study done at [facility]. I'm waiting on them to fax study to us , so I can fax them over to [facility].

## 2024-04-28 NOTE — Assessment & Plan Note (Signed)
 His blood pressure is significantly elevated today.  He has recently been switched to valsartan  160 mg daily per cardiology.  He is additionally prescribed carvedilol  25 mg twice daily, Imdur  120 mg daily, and hydralazine  50 mg 3 times daily. -Increase valsartan  to 320 mg daily.  Continue carvedilol , Imdur , and hydralazine  as currently prescribed.

## 2024-04-28 NOTE — Progress Notes (Signed)
 Cardiology Office Note  Date: 04/28/2024   ID: Robert Brown, DOB 10-29-1951, MRN [id number]  PCP:  Alison Irvine, FNP  Cardiologist:  Lasalle Pointer, MD Electrophysiologist:  None    History of Present Illness: Robert Brown is a 73 y.o. male known to have CTO of distal RCA and distal LAD with normal LVEF (on medical management), paroxysmal A-fib, HTN, DM 2, history of CVA presented to cardiology clinic for follow-up visit.  Patient underwent LHC in 2020 and 2022 for worsening chest pains which showed CTO of distal RCA and CTO of distal LAD with collateralization.  Medical management was recommended with aggressive antianginal therapy. Ranexa was cost prohibitive and hence is currently on BB, and Imdur .  Previously was on amlodipine , was taken off due to leg swelling. Diltiazem  was discontinued in the April 2025 hospitalization due to first-degree AV block.  He was admitted to [facility] in April 2025 for new onset atrial fibrillation with RVR, loaded with amiodarone  and discharged on amiodarone  200 mg twice daily that was later switched to 200 mg once daily due to ongoing nausea.  He presented today for follow-up visit.    He had 1 episode of angina but he is noticeably more short of breath today during my interview.  Has shallow breaths.  This is new compared to last clinic visit.  He also reported feeling extremely tired since discharge from the hospital in April 2025.  I reviewed his PFTs from 2023 that showed severe restrictive lung disease (interstitial) and moderate diffusion defect.  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 His wife has mild dementia and had to help her with ADLs including lifting her every day to the bathroom.  Past Medical History:  Diagnosis Date   CAD (coronary artery disease)    Cancer (HCC)    Diabetes mellitus without complication (HCC)    Hypertension    Stroke [facility])     Past Surgical History:  Procedure Laterality Date   bone grafts     TUMOR  EXCISION      Current Outpatient Medications  Medication Sig Dispense Refill   albuterol  (PROVENTIL ) (2.5 MG/3ML) 0.083% nebulizer solution Take 3 mLs (2.5 mg total) by nebulization every 6 (six) hours as needed for wheezing or shortness of breath. 225 mL 3   albuterol  (VENTOLIN  HFA) 108 (90 Base) MCG/ACT inhaler Inhale 1-2 puffs into the lungs every 6 (six) hours as needed for shortness of breath or wheezing. 54 g 3   amiodarone  (PACERONE ) 200 MG tablet Take 1 tablet (200 mg total) by mouth daily. 90 tablet 1   Blood Glucose Monitoring Suppl (ACCU-CHEK GUIDE) w/Device KIT Use to check glucose once daily. 1 kit 0   busPIRone  (BUSPAR ) 5 MG tablet Take 5 mg by mouth daily as needed (for anxiety).     carvedilol  (COREG ) 25 MG tablet Take 25 mg by mouth 2 (two) times daily.     Continuous Glucose Sensor (FREESTYLE LIBRE 3 PLUS SENSOR) MISC Change sensor every 15 days. 6 each 3   doxycycline  (VIBRAMYCIN ) 50 MG capsule Take 50 mg by mouth daily.     ezetimibe  (ZETIA ) 10 MG tablet Take 1 tablet (10 mg total) by mouth daily. 90 tablet 0   finasteride  (PROSCAR ) 5 MG tablet Take 5 mg by mouth daily.     Fluticasone -Umeclidin-Vilant (TRELEGY ELLIPTA ) 100-62.5-25 MCG/ACT AEPB One click each am (Patient taking differently: Take 1 puff by mouth daily as needed (for shortness of breath).) 3 each 3  hydrALAZINE  (APRESOLINE ) 50 MG tablet Take 1 tablet (50 mg total) by mouth 3 (three) times daily. 270 tablet 3   isosorbide  mononitrate (IMDUR ) 120 MG 24 hr tablet Take 1 tablet (120 mg total) by mouth daily. 90 tablet 3   levothyroxine  (SYNTHROID ) 88 MCG tablet Take 1 tablet (88 mcg total) by mouth every morning. 90 tablet 3   nitroGLYCERIN  (NITROSTAT ) 0.4 MG SL tablet Place 1 tablet (0.4 mg total) under the tongue every 5 (five) minutes as needed for chest pain. If a single episode of chest pain is not relieved by one tablet, the patient will try another within 5 minutes; and if this doesn't relieve the pain, the  patient is instructed to call 911 for transportation to an emergency department. 25 tablet 3   ondansetron  (ZOFRAN -ODT) 4 MG disintegrating tablet Take 1 tablet (4 mg total) by mouth every 8 (eight) hours as needed for nausea or vomiting. 20 tablet 0   rosuvastatin  (CRESTOR ) 40 MG tablet Take 1 tablet (40 mg total) by mouth daily. 90 tablet 3   Semaglutide , 1 MG/DOSE, (OZEMPIC , 1 MG/DOSE,) 2 MG/1.5ML SOPN Inject 1 mg into the skin every Tuesday.     tamsulosin  (FLOMAX ) 0.4 MG CAPS capsule Take 0.4 mg by mouth 2 (two) times daily.     valsartan  (DIOVAN ) 320 MG tablet Take 1 tablet (320 mg total) by mouth daily. 90 tablet 3   warfarin (COUMADIN ) 5 MG tablet Take 1 tablet (5 mg total) by mouth daily. 30 tablet 0   No current facility-administered medications for this visit.   Allergies:  Alcohol , Lisinopril, and Statins   Social History: The patient  reports that he quit smoking about 17 years ago. His smoking use included cigarettes. He has never used smokeless tobacco. He reports that he does not currently use alcohol . He reports that he does not use drugs.   Family History: The patient's family history includes Diabetes in his father and mother.   ROS:  Please see the history of present illness. Otherwise, complete review of systems is positive for none.  All other systems are reviewed and negative.   Physical Exam: VS:  There were no vitals taken for this visit., BMI There is no height or weight on file to calculate BMI.  Wt Readings from Last 3 Encounters:  04/27/24 221 lb 12.8 oz (100.6 kg)  04/06/24 217 lb 6.4 oz (98.6 kg)  03/07/24 223 lb (101.2 kg)    General: Patient appears comfortable at rest. HEENT: Conjunctiva and lids normal, oropharynx clear with moist mucosa. Neck: Supple, no elevated JVP or carotid bruits, no thyromegaly. Lungs: Clear to auscultation, nonlabored breathing at rest. Cardiac: Regular rate and rhythm, no S3 or significant systolic murmur, no pericardial  rub. Abdomen: Soft, nontender, no hepatomegaly, bowel sounds present, no guarding or rebound. Extremities: 1+ pitting edema in bilateral lower extremities, distal pulses 2+. Skin: Warm and dry. Musculoskeletal: No kyphosis. Neuropsychiatric: Alert and oriented x3, affect grossly appropriate.  ECG: NSR  Recent Labwork: 02/15/2024: B Natriuretic Peptide 84.8 02/16/2024: Magnesium 2.0 02/26/2024: Hemoglobin 13.0; Platelets 156 04/01/2024: ALT 45; AST 32; BUN 19; Creatinine, Ser 1.35; Potassium 3.5; Sodium 145; TSH 5.110     Component Value Date/Time   CHOL 175 04/01/2024 1521   TRIG 118 04/01/2024 1521   HDL 49 04/01/2024 1521   CHOLHDL 3.6 04/01/2024 1521   LDLCALC 105 (H) 04/01/2024 1521    Other Studies Reviewed Today: LHC in 2022 at [facility] LAD lesion is 100% stenosed.  Prox LAD to Mid LAD lesion is 20% stenosed.   Mid RCA to Dist RCA lesion is 100% stenosed.  The angiogram was most notable for chronic occlusion of the distal/apical  LAD and distal RCA.  There is otherwise scattered mild, nonocclusive  disease of the circumflex and proximal LAD.  Findings are unchanged from  the previous study 2 years ago.  Pursue aggressive medical therapy for angina prophylaxis and secondary  prevention.   Echocardiogram in 09/2021 Left Ventricle: Systolic function is normal. EF: 60-65%.    Left Ventricle: Doppler parameters consistent with moderate diastolic  dysfunction and elevated LA pressure.    Left Atrium: Left atrium is mildly dilated.    Mitral Valve: There is mild regurgitation.    Aortic Valve: Trace to mild aortic valve regurgitation   Assessment and Plan:  Paroxysmal A-fib: New onset A-fib with RVR during April 2025 hospitalization.  Started on amiodarone  loading followed by 200 mg twice daily that had to be decreased to 200 mg once daily due to ongoing nausea.  No nausea anymore but visibly short of breath during my interview today.  This is new compared to March 2025  visit with me.  PFTs in 2023 showed severe restrictive lung disease (interstitial) and moderate diffusion defect.  Amiodarone  likely exacerbated his lung pathology.  Will stop amiodarone .  LA's mildly dilated on the echocardiogram.  Hopefully, he will stay in rhythm.  Chest pains during April hospitalization likely from demand ischemia from A-fib with RVR.  Continue carvedilol  25 mg twice daily and warfarin.  His INR was fluctuating for the last few weeks, inquired if Eliquis  would be a better option.  Eliquis  is cost prohibitive, will refer to patient assistance program.  Hopefully Eliquis  is covered.  No risk of falls and no bleeding complications.  CAD (CTO distal RCA and distal LAD), medical management: 1 episode of angina since April 2025.  Continue current antianginal therapy, carvedilol  25 mg twice daily, Imdur  120 mg once daily.  Ranexa was cost prohibitive.  Did not tolerate amlodipine  due to leg swelling.  Continue cardioprotective medications, aspirin  81 mg once daily, rosuvastatin  20 mg nightly and Zetia  10 mg once daily.  Will hold off on cardiac rehab until his blood pressures are well-controlled.  HTN, poorly controlled: Home blood pressures range around 180 to 200 mmHg SBP.  He saw his PCP yesterday who increased the valsartan  dose from 160 mg to 320 mg once daily, did not tolerate amlodipine  due to leg swelling, diltiazem  was discontinued during April hospitalization due to first-degree AV block.  Start nifedipine 60 mg once daily.  He was started on hydralazine  50 mg TID by Neomi Banks in May 2025.  Otherwise, we will continue carvedilol  25 mg twice daily and Imdur  120 mg once daily.  Obtain renal artery Doppler to evaluate for any renal artery stenosis.  AKI versus AKI on CKD: Serum creatinine 1.3 in the last 2 months but prior to that it was normal.  Repeat BMP.  HLD, not at goal: Continue rosuvastatin  20 mg nightly and Zetia  10 mg once daily.  Goal LDL should be less than 55.  LDL 105  in May 2025.  Prior Auth for inclisiran was previously sent by Neomi Banks, cannot find.  Start Inclisiran.  Chronic diastolic heart failure: Short of breath during my interview today.  Shallow breaths.  He shortness of breath is likely secondary to amiodarone .  Will stop amiodarone  and reassess his symptoms in 6 weeks.  Echocardiogram from 2022 at  [facility] showed normal LVEF, moderate diastolic dysfunction with elevated LA pressure, mild MR and mild AR.  Not on p.o. Lasix  anymore.  Will reevaluate his symptoms in the next clinic visit and start diuretics if needed.  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   Disposition:  Follow up 6 weeks  Signed, Shelbi Vaccaro Beauford Bounds, MD, 04/28/2024 12:54 PM    [facility] at [facility] [address] [place], [place] [address]

## 2024-04-28 NOTE — Telephone Encounter (Signed)
 Copied from CRM [id number]. Topic: Clinical - Order For Equipment >> Apr 26, 2024 12:59 PM Juliana Ocean wrote: Reason for CRM: Autry Legions w [facility] calling to ask for a valid Rx for pt's cpap supplies. Pt needs mask and some other supplies Cb [phone]  Fax [phone]  Order has been sent over to [facility] . Nothing else further needed.

## 2024-04-28 NOTE — Assessment & Plan Note (Signed)
 Lipid panel updated last month.  Total cholesterol 175 and LDL 105.  He remains on rosuvastatin  and ezetimibe .  Per review of documentation, cardiology is attempting to start Leqvio.

## 2024-04-28 NOTE — Patient Instructions (Addendum)
 Medication Instructions:  Your physician has recommended you make the following change in your medication:  Stop taking Amiodarone   Start Nifedipine 60 mg once daily  Continue taking all other medications as prescribed  Labwork: BMET in one week at [facility] in [place]   Testing/Procedures: Your physician has requested that you have a renal artery duplex. During this test, an ultrasound is used to evaluate blood flow to the kidneys. Allow one hour for this exam. Do not eat after midnight the day before and avoid carbonated beverages. Take your medications as you usually do.   Follow-Up: Your physician recommends that you schedule a follow-up appointment in: 6 weeks  Any Other Special Instructions Will Be Listed Below (If Applicable). Thank you for choosing [facility]!     If you need a refill on your cardiac medications before your next appointment, please call your pharmacy.

## 2024-04-28 NOTE — Assessment & Plan Note (Addendum)
 Regular rate and rhythm detected on exam today.  Closely followed by cardiology.  He is scheduled for follow-up tomorrow (6/12).  He remains on amiodarone  and has recently been switched to warfarin from Eliquis  due to cost.  He is frustrated with having to take warfarin and would like to switch back to a DOAC if possible.  I have recommended that he discuss his concerns with cardiology at his appointment tomorrow.

## 2024-04-28 NOTE — Assessment & Plan Note (Signed)
 Followed by endocrinology.  A1c 6.6 on labs from April.  He remains on Ozempic  1 mg weekly.  No longer prescribed metformin .

## 2024-04-29 ENCOUNTER — Encounter: Payer: Self-pay | Admitting: Nurse Practitioner

## 2024-04-29 ENCOUNTER — Telehealth (INDEPENDENT_AMBULATORY_CARE_PROVIDER_SITE_OTHER): Admitting: Nurse Practitioner

## 2024-04-29 ENCOUNTER — Ambulatory Visit: Admitting: Nurse Practitioner

## 2024-04-29 ENCOUNTER — Other Ambulatory Visit: Payer: Self-pay

## 2024-04-29 ENCOUNTER — Telehealth: Payer: Self-pay

## 2024-04-29 ENCOUNTER — Other Ambulatory Visit (HOSPITAL_COMMUNITY): Payer: Self-pay | Admitting: Physician Assistant

## 2024-04-29 DIAGNOSIS — I480 Paroxysmal atrial fibrillation: Secondary | ICD-10-CM | POA: Diagnosis not present

## 2024-04-29 DIAGNOSIS — J449 Chronic obstructive pulmonary disease, unspecified: Secondary | ICD-10-CM

## 2024-04-29 DIAGNOSIS — Z87891 Personal history of nicotine dependence: Secondary | ICD-10-CM

## 2024-04-29 DIAGNOSIS — G4733 Obstructive sleep apnea (adult) (pediatric): Secondary | ICD-10-CM

## 2024-04-29 NOTE — Assessment & Plan Note (Signed)
 Stable. No recent exacerbations. Action plan in place.

## 2024-04-29 NOTE — Assessment & Plan Note (Addendum)
 Severe OSA on CPAP.  Excellent compliance and control.  Receives benefit from use.  Pressure adjustment seems to have helped.  Still has some residual fatigue, related to his A-fib.  Aware of risks of untreated sleep apnea.  Safe driving practices reviewed.  Healthy weight management encouraged. Will follow-up with the PCC's on his supply order.  Need to have them escalate this problem within [place].  Patient Instructions  Continue to use CPAP every night, minimum of 4-6 hours a night.  Change equipment as directed. Wash your tubing with warm soap and water daily, hang to dry. Wash humidifier portion weekly. Use bottled, distilled water and change daily Be aware of reduced alertness and do not drive or operate heavy machinery if experiencing this or drowsiness.  Exercise encouraged, as tolerated. Healthy weight management discussed.  Avoid or decrease alcohol  consumption and medications that make you more sleepy, if possible. Notify if persistent daytime sleepiness occurs even with consistent use of PAP therapy.  Will reach out to [facility] again Call us  next week if you haven't heard something  Continue inhalers as prescribed   Follow up in 1 year with Robert Jerry Ami Thornsberry,NP Keep future follow ups with Dr. Waymond Hailey. If symptoms do not improve or worsen, please contact office for sooner follow up or seek emergency care.

## 2024-04-29 NOTE — Patient Instructions (Addendum)
 Continue to use CPAP every night, minimum of 4-6 hours a night.  Change equipment as directed. Wash your tubing with warm soap and water daily, hang to dry. Wash humidifier portion weekly. Use bottled, distilled water and change daily Be aware of reduced alertness and do not drive or operate heavy machinery if experiencing this or drowsiness.  Exercise encouraged, as tolerated. Healthy weight management discussed.  Avoid or decrease alcohol  consumption and medications that make you more sleepy, if possible. Notify if persistent daytime sleepiness occurs even with consistent use of PAP therapy.  Will reach out to [facility] again Call us  next week if you haven't heard something  Continue inhalers as prescribed   Follow up in 1 year with Robert Jerry Kasiyah Platter,NP Keep future follow ups with Dr. Waymond Brown. If symptoms do not improve or worsen, please contact office for sooner follow up or seek emergency care.

## 2024-04-29 NOTE — Telephone Encounter (Signed)
 Hello,  Auth Submission: DENIED Site of care: Site of care: [facility] Payer: [employer] Medication & CPT/J Code(s) submitted: Leqvio (Inclisiran) [id number] Diagnosis Code:  Route of submission (phone, fax, portal): portal Phone # Fax # Auth type:  Units/visits requested:  Reference number:     Authorization has been DENIED because pt must try/fail praluent or repatha.

## 2024-04-29 NOTE — Progress Notes (Signed)
 Patient ID: Robert Brown, male     DOB: 1951/04/14, 73 y.o.      MRN: [id number]  No chief complaint on file.   Virtual Visit via Video Note  I connected with Robert Brown on 04/29/24 at  2:30 PM EDT by a video enabled telemedicine application and verified that I am speaking with the correct person using two identifiers.  Location: Patient: Home Provider: Office   I discussed the limitations of evaluation and management by telemedicine and the availability of in person appointments. The patient expressed understanding and agreed to proceed.  History of Present Illness: 73 year old male, former smoker followed for OSA on CPAP and COPD. He is followed by Dr. Waymond Hailey. Past medical history significant for HFpEF, CAD, HTN, a fib RVR on Eliquis  and amio, hx of stroke, vertebral artery stenosis, OSA on CPAP, DM, hypothyroid, BPH, radiculopathy, hx of prostate cancer, HLD.    TEST/EVENTS:  06/2019 NPSG outside facility: RDI 33, SpO2 low 88%   03/03/2024: OV with Jett Kulzer NP for sleep consult Discussed the use of AI scribe software for clinical note transcription with the patient, who gave verbal consent to proceed. Robert Brown is a 73 year old male who presents for a sleep consult regarding his CPAP therapy. He was referred for a sleep consult following a recent hospitalization for new onset a fib. His OSA was previously managed at [facility].  He has been on CPAP therapy for many years due to sleep apnea, with the last sleep study conducted in 2022 at [facility]. He uses his CPAP machine every night but experiences issues with the machine blowing too hard sometimes, waking him up, and requiring him to restart it. The mask is uncomfortable and causes leaks. He also has to refill the water tank during the night. He has tried different masks. He like the hybrid mask but had dry mouth with it. Never made any other setting adjustments while on it. He's currently using a large FFM, which he  feels doesn't seal well around his nose and he doesn't like th headgear. It can be cumbersome to get on.  Despite the issues, he cannot sleep without the CPAP as it significantly improves his sleep quality, reducing nighttime awakenings and the need to urinate frequently. He denies any drowsy driving.  He has felt fatigued during the day, which he attributes to recent heart issues and medication changes. Before these heart issues, he felt the CPAP improved his sleep and energy levels.  12/04/2023-03/02/2024: CPAP 13 cmH2O 88/90 days; 94% >4 hr; average use 7 hr 40 min Leaks 95th 81.1 AHI 0.2  [place] 8  04/29/2024: Today - follow up Patient presents today for follow-up.  At his last visit, we adjusted his CPAP pressures and also gave him a new mask.  Feels like the pressure change has helped.  Sleeping well for the most part.  Energy levels are okay during the day.  He is just dealing with A-fib right now, which does make him a little more fatigued.  No issues with drowsy driving or morning headaches.  Unfortunately, he has still not received updated supplies from the DME company.  Multiple orders have been sent.  He contacted them and they stated they still did not have an updated order. CMA has contact [employer] numerous times; faxed and emailed orders.   03/29/2024-04/27/2024 CPAP 8-13 cmH2O 30/30 days; 100% >4 hr; average use 7 hr 42 min Pressure 10.9 Leaks 95th 55.6 AHI 0.5  Allergies  Allergen Reactions   Alcohol  Other (See Comments)    Prefers not to drink   Lisinopril     Medicines ending in -pril - unknown reaction   Statins     myalgia   Immunization History  Administered Date(s) Administered   Fluad Quad(high Dose 65+) 09/10/2020   Fluad Trivalent(High Dose 65+) 10/13/2023   Influenza, High Dose Seasonal PF 09/06/2018, 10/11/2019, 08/28/2021   PFIZER Comirnaty(Gray Top)Covid-19 Tri-Sucrose Vaccine 03/12/2020, 04/06/2020, 10/22/2020, 04/01/2021   PFIZER(Purple Top)SARS-COV-2  Vaccination 03/12/2020, 04/06/2020, 10/22/2020   Pfizer Covid-19 Vaccine Bivalent Booster 58yrs & up 09/04/2021   Pneumococcal Conjugate-13 06/18/2015   Pneumococcal Polysaccharide-23 06/23/2017, 06/23/2017   Tdap 02/22/2020   Past Medical History:  Diagnosis Date   CAD (coronary artery disease)    Cancer (HCC)    Diabetes mellitus without complication (HCC)    Hypertension    Stroke (HCC)     Tobacco History: Social History   Tobacco Use  Smoking Status Former   Current packs/day: 0.00   Types: Cigarettes   Quit date: 11/17/2006   Years since quitting: 17.4  Smokeless Tobacco Never  Tobacco Comments   Former smoker 03/07/24   Counseling given: Not Answered Tobacco comments: Former smoker 03/07/24   Outpatient Medications Prior to Visit  Medication Sig Dispense Refill   albuterol  (PROVENTIL ) (2.5 MG/3ML) 0.083% nebulizer solution Take 3 mLs (2.5 mg total) by nebulization every 6 (six) hours as needed for wheezing or shortness of breath. 225 mL 3   albuterol  (VENTOLIN  HFA) 108 (90 Base) MCG/ACT inhaler Inhale 1-2 puffs into the lungs every 6 (six) hours as needed for shortness of breath or wheezing. 54 g 3   Blood Glucose Monitoring Suppl (ACCU-CHEK GUIDE) w/Device KIT Use to check glucose once daily. 1 kit 0   busPIRone  (BUSPAR ) 5 MG tablet Take 5 mg by mouth daily as needed (for anxiety).     carvedilol  (COREG ) 25 MG tablet Take 25 mg by mouth 2 (two) times daily.     Continuous Glucose Sensor (FREESTYLE LIBRE 3 PLUS SENSOR) MISC Change sensor every 15 days. 6 each 3   doxycycline  (VIBRAMYCIN ) 50 MG capsule Take 50 mg by mouth daily.     ezetimibe  (ZETIA ) 10 MG tablet Take 1 tablet (10 mg total) by mouth daily. 90 tablet 0   finasteride  (PROSCAR ) 5 MG tablet Take 5 mg by mouth daily.     Fluticasone -Umeclidin-Vilant (TRELEGY ELLIPTA ) 100-62.5-25 MCG/ACT AEPB One click each am 3 each 3   hydrALAZINE  (APRESOLINE ) 50 MG tablet Take 1 tablet (50 mg total) by mouth 3 (three)  times daily. 270 tablet 3   isosorbide  mononitrate (IMDUR ) 120 MG 24 hr tablet Take 1 tablet (120 mg total) by mouth daily. 90 tablet 3   levothyroxine  (SYNTHROID ) 88 MCG tablet Take 1 tablet (88 mcg total) by mouth every morning. 90 tablet 3   NIFEdipine  (PROCARDIA  XL/NIFEDICAL XL) 60 MG 24 hr tablet Take 1 tablet (60 mg total) by mouth daily. 30 tablet 5   nitroGLYCERIN  (NITROSTAT ) 0.4 MG SL tablet Place 1 tablet (0.4 mg total) under the tongue every 5 (five) minutes as needed for chest pain. If a single episode of chest pain is not relieved by one tablet, the patient will try another within 5 minutes; and if this doesn't relieve the pain, the patient is instructed to call 911 for transportation to an emergency department. 25 tablet 3   ondansetron  (ZOFRAN -ODT) 4 MG disintegrating tablet Take 1 tablet (4 mg total) by mouth every 8 (  eight) hours as needed for nausea or vomiting. 20 tablet 0   rosuvastatin  (CRESTOR ) 40 MG tablet Take 1 tablet (40 mg total) by mouth daily. 90 tablet 3   Semaglutide , 1 MG/DOSE, (OZEMPIC , 1 MG/DOSE,) 2 MG/1.5ML SOPN Inject 1 mg into the skin every Tuesday.     tamsulosin  (FLOMAX ) 0.4 MG CAPS capsule Take 0.4 mg by mouth 2 (two) times daily.     valsartan  (DIOVAN ) 320 MG tablet Take 1 tablet (320 mg total) by mouth daily. 90 tablet 3   warfarin (COUMADIN ) 5 MG tablet Take 1 tablet (5 mg total) by mouth daily. 30 tablet 0   No facility-administered medications prior to visit.     Review of Systems:   Constitutional: No weight loss or gain, night sweats, fevers, chills, or lassitude. +fatigue  HEENT: No headaches, difficulty swallowing, tooth/dental problems, or sore throat. No sneezing, itching, ear ache, nasal congestion, or post nasal drip CV:  +palpitations. No chest pain, orthopnea, PND, swelling in lower extremities, anasarca, dizziness, syncope Resp: +stable shortness of breath with exertion. No excess mucus or change in color of mucus. No productive or  non-productive. No hemoptysis. No wheezing.  No chest wall deformity GI:  No heartburn, indigestion GU: No nocturia   Skin: No rash, lesions, ulcerations Neuro: No memory impairment  Psych: No depression or anxiety. Mood stable. +sleep disturbance   Observations/Objective: Patient is well-developed, well-nourished in no acute distress.  Resting comfortably at home.  No labored breathing.  Speech is clear and coherent with logical content.  Patient is alert and oriented at baseline.   Assessment and Plan: OSA (obstructive sleep apnea) Severe OSA on CPAP.  Excellent compliance and control.  Receives benefit from use.  Pressure adjustment seems to have helped.  Still has some residual fatigue, related to his A-fib.  Aware of risks of untreated sleep apnea.  Safe driving practices reviewed.  Healthy weight management encouraged. Will follow-up with the PCC's on his supply order.  Need to have them escalate this problem within [place].  Patient Instructions  Continue to use CPAP every night, minimum of 4-6 hours a night.  Change equipment as directed. Wash your tubing with warm soap and water daily, hang to dry. Wash humidifier portion weekly. Use bottled, distilled water and change daily Be aware of reduced alertness and do not drive or operate heavy machinery if experiencing this or drowsiness.  Exercise encouraged, as tolerated. Healthy weight management discussed.  Avoid or decrease alcohol  consumption and medications that make you more sleepy, if possible. Notify if persistent daytime sleepiness occurs even with consistent use of PAP therapy.  Will reach out to [facility] again Call us  next week if you haven't heard something  Continue inhalers as prescribed   Follow up in 1 year with [place] Jerry Darvin Dials,NP Keep future follow ups with Dr. Waymond Hailey. If symptoms do not improve or worsen, please contact office for sooner follow up or seek emergency care.    COPD GOLD 0 Stable. No recent  exacerbations. Action plan in place.   Paroxysmal atrial fibrillation (HCC) Follow up with cardiology as scheduled   I discussed the assessment and treatment plan with the patient. The patient was provided an opportunity to ask questions and all were answered. The patient agreed with the plan and demonstrated an understanding of the instructions.   The patient was advised to call back or seek an in-person evaluation if the symptoms worsen or if the condition fails to improve as anticipated.  I provided 25 minutes  of non-face-to-face time during this encounter.   Roetta Clarke, NP

## 2024-04-29 NOTE — Assessment & Plan Note (Signed)
 Follow up with cardiology as scheduled.

## 2024-05-02 ENCOUNTER — Encounter: Payer: Self-pay | Admitting: Nurse Practitioner

## 2024-05-02 ENCOUNTER — Other Ambulatory Visit (HOSPITAL_COMMUNITY): Payer: Self-pay

## 2024-05-02 ENCOUNTER — Telehealth: Payer: Self-pay | Admitting: Pharmacy Technician

## 2024-05-02 MED ORDER — REPATHA SURECLICK 140 MG/ML ~~LOC~~ SOAJ: 140.0000 mg | SUBCUTANEOUS | 2 refills | Status: DC

## 2024-05-02 NOTE — Telephone Encounter (Signed)
 Left message for patient to call the office

## 2024-05-02 NOTE — Telephone Encounter (Signed)
 Prescription refill request received for warfarin [place]: 04/28/24 ([place])  Next INR check: 05/04/24 Warfarin tablet strength: 5mg   Appropriate dose. Refill sent.

## 2024-05-02 NOTE — Telephone Encounter (Signed)
 Pharmacy Patient Advocate Encounter  Received notification from [facility] that Prior Authorization for repatha has been APPROVED from 05/02/24 to 11/01/24. Unable to obtain price due to refill too soon rejection, last fill date 04/22/24 next available fill date07/07/25   PA #/Case ID/Reference #: [id number]

## 2024-05-02 NOTE — Addendum Note (Signed)
 Addended by: Camilo Cella on: 05/02/2024 01:48 PM   Modules accepted: Orders

## 2024-05-02 NOTE — Telephone Encounter (Signed)
 Advised patient of change in medication. He stated that he has tried in the past made him sick will try again to see what happens this time.Inquired about side effects of medication advise him to call if unable to tolerate.

## 2024-05-02 NOTE — Telephone Encounter (Signed)
 Pharmacy Patient Advocate Encounter   Received notification from Physician's Office that prior authorization for repatha is required/requested.   Insurance verification completed.   The patient is insured through [employer] .   Per test claim: PA required; PA submitted to above mentioned insurance via CoverMyMeds Key/confirmation #/EOC [id number] Status is pending

## 2024-05-04 ENCOUNTER — Ambulatory Visit: Attending: Internal Medicine

## 2024-05-04 DIAGNOSIS — I4891 Unspecified atrial fibrillation: Secondary | ICD-10-CM | POA: Diagnosis not present

## 2024-05-04 DIAGNOSIS — Z5181 Encounter for therapeutic drug level monitoring: Secondary | ICD-10-CM | POA: Diagnosis not present

## 2024-05-04 DIAGNOSIS — I480 Paroxysmal atrial fibrillation: Secondary | ICD-10-CM

## 2024-05-04 LAB — POCT INR
INR: 1.3 — AB (ref 2.0–3.0)
INR: 1.3 — AB (ref 2.0–3.0)

## 2024-05-04 NOTE — Patient Instructions (Signed)
 Description   Start taking Warfarin 1/2 tablet daily except 1 tablet on Wednesdays.   Recheck in 1 week. Aaron Aas

## 2024-05-05 ENCOUNTER — Ambulatory Visit (HOSPITAL_COMMUNITY): Admitting: Physician Assistant

## 2024-05-10 ENCOUNTER — Ambulatory Visit: Attending: Internal Medicine | Admitting: *Deleted

## 2024-05-10 DIAGNOSIS — Z5181 Encounter for therapeutic drug level monitoring: Secondary | ICD-10-CM | POA: Diagnosis not present

## 2024-05-10 DIAGNOSIS — I480 Paroxysmal atrial fibrillation: Secondary | ICD-10-CM

## 2024-05-10 LAB — POCT INR: INR: 1.5 — AB (ref 2.0–3.0)

## 2024-05-10 MED ORDER — WARFARIN SODIUM 5 MG PO TABS: ORAL_TABLET | ORAL | 3 refills | Status: DC

## 2024-05-10 NOTE — Patient Instructions (Signed)
 Increase warfarin to 1/2 tablet daily except 1 tablet on Tuesdays and Fridays.   Recheck in 1 week.

## 2024-05-10 NOTE — Progress Notes (Signed)
Please see anticoagulation encounter.

## 2024-05-11 ENCOUNTER — Telehealth: Payer: Self-pay | Admitting: Nurse Practitioner

## 2024-05-11 NOTE — Telephone Encounter (Signed)
 Let pt know PAP of ozempic  is here

## 2024-05-12 NOTE — Telephone Encounter (Signed)
Pt picked up pt assistance of ozempic

## 2024-05-13 NOTE — Telephone Encounter (Signed)
 Patient was called and made aware.

## 2024-05-17 ENCOUNTER — Ambulatory Visit: Attending: Internal Medicine | Admitting: *Deleted

## 2024-05-17 DIAGNOSIS — Z5181 Encounter for therapeutic drug level monitoring: Secondary | ICD-10-CM

## 2024-05-17 DIAGNOSIS — I480 Paroxysmal atrial fibrillation: Secondary | ICD-10-CM

## 2024-05-17 LAB — POCT INR: INR: 1.6 — AB (ref 2.0–3.0)

## 2024-05-17 NOTE — Patient Instructions (Signed)
 Increase warfarin to 1 tablet daily except 1/2 tablet on Mondays, Wednesdays and Fridays.   Recheck in 1 week.

## 2024-05-21 ENCOUNTER — Other Ambulatory Visit: Payer: Self-pay | Admitting: Nurse Practitioner

## 2024-05-24 ENCOUNTER — Ambulatory Visit: Attending: Internal Medicine | Admitting: *Deleted

## 2024-05-24 DIAGNOSIS — I480 Paroxysmal atrial fibrillation: Secondary | ICD-10-CM

## 2024-05-24 DIAGNOSIS — Z5181 Encounter for therapeutic drug level monitoring: Secondary | ICD-10-CM

## 2024-05-24 LAB — POCT INR: INR: 2.1 (ref 2.0–3.0)

## 2024-05-24 NOTE — Patient Instructions (Signed)
 Continue warfarin 1 tablet daily except 1/2 tablet on Mondays, Wednesdays and Fridays.   Recheck in 1 week.

## 2024-05-24 NOTE — Progress Notes (Signed)
Please see anticoagulation encounter.

## 2024-05-27 ENCOUNTER — Ambulatory Visit (HOSPITAL_COMMUNITY)

## 2024-05-30 ENCOUNTER — Encounter

## 2024-05-31 ENCOUNTER — Ambulatory Visit (INDEPENDENT_AMBULATORY_CARE_PROVIDER_SITE_OTHER): Admitting: *Deleted

## 2024-05-31 ENCOUNTER — Encounter: Payer: Self-pay | Admitting: Internal Medicine

## 2024-05-31 ENCOUNTER — Ambulatory Visit: Attending: Internal Medicine | Admitting: Internal Medicine

## 2024-05-31 VITALS — BP 180/72 | HR 70 | Ht 70.0 in | Wt 217.0 lb

## 2024-05-31 DIAGNOSIS — I480 Paroxysmal atrial fibrillation: Secondary | ICD-10-CM | POA: Diagnosis not present

## 2024-05-31 DIAGNOSIS — I2510 Atherosclerotic heart disease of native coronary artery without angina pectoris: Secondary | ICD-10-CM

## 2024-05-31 DIAGNOSIS — Z5181 Encounter for therapeutic drug level monitoring: Secondary | ICD-10-CM | POA: Diagnosis not present

## 2024-05-31 DIAGNOSIS — E785 Hyperlipidemia, unspecified: Secondary | ICD-10-CM

## 2024-05-31 DIAGNOSIS — Z79899 Other long term (current) drug therapy: Secondary | ICD-10-CM | POA: Diagnosis not present

## 2024-05-31 LAB — POCT INR: INR: 1.8 — AB (ref 2.0–3.0)

## 2024-05-31 MED ORDER — ROSUVASTATIN CALCIUM 40 MG PO TABS: 40.0000 mg | ORAL_TABLET | Freq: Every day | ORAL | 6 refills | Status: AC

## 2024-05-31 NOTE — Progress Notes (Signed)
Please see anticoagulation encounter.

## 2024-05-31 NOTE — Patient Instructions (Signed)
 Take warfarin 1 1/2 tablets tonight then increase dose to 1 tablet daily except 1/2 tablet on Mondays and Fridays.   Recheck in 1 week.

## 2024-05-31 NOTE — Patient Instructions (Addendum)
 Medication Instructions:   Increase Rosuvastatin  to 40mg  daily  Continue all other medications.     Labwork:  FLP - order given today Reminder:  Nothing to eat or drink after 12 midnight prior to labs. Please do about a week prior to next visit   Testing/Procedures:  none  Follow-Up:  3 months   Any Other Special Instructions Will Be Listed Below (If Applicable).   If you need a refill on your cardiac medications before your next appointment, please call your pharmacy.

## 2024-05-31 NOTE — Progress Notes (Signed)
 Cardiology Office Note  Date: 05/31/2024   ID: Robert Brown, DOB Mar 08, 1951, MRN [id number]  PCP:  Robert Doffing, FNP  Cardiologist:  Robert SHAUNNA Maywood, MD Electrophysiologist:  None    History of Present Illness: Robert Brown is a 73 y.o. male known to have CTO of distal RCA and distal LAD with normal LVEF (on medical management), paroxysmal A-fib, HTN, DM 2, history of CVA presented to cardiology clinic for follow-up visit.  Patient underwent LHC in 2020 and 2022 for worsening chest pains which showed CTO of distal RCA and CTO of distal LAD with collateralization.  Medical management was recommended with aggressive antianginal therapy. Ranexa was cost prohibitive and hence is currently on BB, and Imdur .  Previously was on amlodipine , was taken off due to leg swelling. Diltiazem  was discontinued in the April 2025 hospitalization due to first-degree AV block.  He was admitted to [facility] in April 2025 for new onset atrial fibrillation with RVR, loaded with amiodarone  and discharged on amiodarone  200 mg twice daily that was later switched to 200 mg once daily due to ongoing nausea.  Amiodarone  was discontinued in the last clinic visit due to evidence from PFTs from 2023 noting severe restrictive lung disease (interstitial) and moderate diffusion defect.  He presented today for follow-up visit.    DOE improved.  No interval angina.  Blood pressures at home range around 140 to 150 mmHg SBP.  He feels that he is overmedicated and does not feel good.  Currently, he is taking 1 antihypertensive medication at a time, checking his blood pressure and if elevated, only then he is taking the second antihypertensive medication.  Does not have any dizziness, palpitations, leg swelling.  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 His wife has mild dementia and had to help her with ADLs including lifting her every day to the bathroom.  Past Medical History:  Diagnosis Date   CAD (coronary artery disease)     Cancer (HCC)    Diabetes mellitus without complication (HCC)    Hypertension    Stroke [facility])     Past Surgical History:  Procedure Laterality Date   bone grafts     TUMOR EXCISION      Current Outpatient Medications  Medication Sig Dispense Refill   albuterol  (PROVENTIL ) (2.5 MG/3ML) 0.083% nebulizer solution Take 3 mLs (2.5 mg total) by nebulization every 6 (six) hours as needed for wheezing or shortness of breath. 225 mL 3   albuterol  (VENTOLIN  HFA) 108 (90 Base) MCG/ACT inhaler Inhale 1-2 puffs into the lungs every 6 (six) hours as needed for shortness of breath or wheezing. 54 g 3   Blood Glucose Monitoring Suppl (ACCU-CHEK GUIDE) w/Device KIT Use to check glucose once daily. 1 kit 0   busPIRone  (BUSPAR ) 5 MG tablet Take 5 mg by mouth daily as needed (for anxiety).     carvedilol  (COREG ) 25 MG tablet Take 25 mg by mouth 2 (two) times daily.     Continuous Glucose Sensor (FREESTYLE LIBRE 3 PLUS SENSOR) MISC Change sensor every 15 days. 6 each 3   doxycycline  (VIBRAMYCIN ) 50 MG capsule Take 50 mg by mouth daily.     Evolocumab  (REPATHA  SURECLICK) 140 MG/ML SOAJ Inject 140 mg into the skin every 14 (fourteen) days. 2 mL 2   ezetimibe  (ZETIA ) 10 MG tablet Take 1 tablet (10 mg total) by mouth daily. 90 tablet 0   finasteride  (PROSCAR ) 5 MG tablet Take 5 mg by mouth daily.  Fluticasone -Umeclidin-Vilant (TRELEGY ELLIPTA ) 100-62.5-25 MCG/ACT AEPB One click each am 3 each 3   hydrALAZINE  (APRESOLINE ) 50 MG tablet Take 1 tablet (50 mg total) by mouth 3 (three) times daily. 270 tablet 3   isosorbide  mononitrate (IMDUR ) 120 MG 24 hr tablet Take 1 tablet (120 mg total) by mouth daily. 90 tablet 3   levothyroxine  (SYNTHROID ) 88 MCG tablet TAKE 1 TABLET BY MOUTH EVERY  MORNING 90 tablet 3   NIFEdipine  (PROCARDIA  XL/NIFEDICAL XL) 60 MG 24 hr tablet Take 1 tablet (60 mg total) by mouth daily. 30 tablet 5   nitroGLYCERIN  (NITROSTAT ) 0.4 MG SL tablet Place 1 tablet (0.4 mg total) under the tongue  every 5 (five) minutes as needed for chest pain. If a single episode of chest pain is not relieved by one tablet, the patient will try another within 5 minutes; and if this doesn't relieve the pain, the patient is instructed to call 911 for transportation to an emergency department. 25 tablet 3   ondansetron  (ZOFRAN -ODT) 4 MG disintegrating tablet Take 1 tablet (4 mg total) by mouth every 8 (eight) hours as needed for nausea or vomiting. 20 tablet 0   rosuvastatin  (CRESTOR ) 40 MG tablet Take 1 tablet (40 mg total) by mouth daily. 90 tablet 3   Semaglutide , 1 MG/DOSE, (OZEMPIC , 1 MG/DOSE,) 2 MG/1.5ML SOPN Inject 1 mg into the skin every Tuesday.     tamsulosin  (FLOMAX ) 0.4 MG CAPS capsule Take 0.4 mg by mouth 2 (two) times daily.     valsartan  (DIOVAN ) 320 MG tablet Take 1 tablet (320 mg total) by mouth daily. 90 tablet 3   warfarin (COUMADIN ) 5 MG tablet TAKE 1/2 TO 1 TABLET BY MOUTH DAILY OR AS DIRECTED BY COUMADIN  CLINIC 30 tablet 3   No current facility-administered medications for this visit.   Allergies:  Alcohol , Lisinopril, and Statins   Social History: The patient  reports that he quit smoking about 17 years ago. His smoking use included cigarettes. He has never used smokeless tobacco. He reports that he does not currently use alcohol . He reports that he does not use drugs.   Family History: The patient's family history includes Diabetes in his father and mother.   ROS:  Please see the history of present illness. Otherwise, complete review of systems is positive for none.  All other systems are reviewed and negative.   Physical Exam: VS:  There were no vitals taken for this visit., BMI There is no height or weight on file to calculate BMI.  Wt Readings from Last 3 Encounters:  04/28/24 224 lb 3.2 oz (101.7 kg)  04/27/24 221 lb 12.8 oz (100.6 kg)  04/06/24 217 lb 6.4 oz (98.6 kg)    General: Patient appears comfortable at rest. HEENT: Conjunctiva and lids normal, oropharynx clear  with moist mucosa. Neck: Supple, no elevated JVP or carotid bruits, no thyromegaly. Lungs: Clear to auscultation, nonlabored breathing at rest. Cardiac: Regular rate and rhythm, no S3 or significant systolic murmur, no pericardial rub. Abdomen: Soft, nontender, no hepatomegaly, bowel sounds present, no guarding or rebound. Extremities: 1+ pitting edema in bilateral lower extremities, distal pulses 2+. Skin: Warm and dry. Musculoskeletal: No kyphosis. Neuropsychiatric: Alert and oriented x3, affect grossly appropriate.  ECG: NSR  Recent Labwork: 02/15/2024: B Natriuretic Peptide 84.8 02/16/2024: Magnesium 2.0 02/26/2024: Hemoglobin 13.0; Platelets 156 04/01/2024: ALT 45; AST 32; BUN 19; Creatinine, Ser 1.35; Potassium 3.5; Sodium 145; TSH 5.110     Component Value Date/Time   CHOL 175 04/01/2024 1521  TRIG 118 04/01/2024 1521   HDL 49 04/01/2024 1521   CHOLHDL 3.6 04/01/2024 1521   LDLCALC 105 (H) 04/01/2024 1521    Other Studies Reviewed Today: LHC in 2022 at [facility] LAD lesion is 100% stenosed.   Prox LAD to Mid LAD lesion is 20% stenosed.   Mid RCA to Dist RCA lesion is 100% stenosed.  The angiogram was most notable for chronic occlusion of the distal/apical  LAD and distal RCA.  There is otherwise scattered mild, nonocclusive  disease of the circumflex and proximal LAD.  Findings are unchanged from  the previous study 2 years ago.  Pursue aggressive medical therapy for angina prophylaxis and secondary  prevention.   Echocardiogram in 09/2021 Left Ventricle: Systolic function is normal. EF: 60-65%.    Left Ventricle: Doppler parameters consistent with moderate diastolic  dysfunction and elevated LA pressure.    Left Atrium: Left atrium is mildly dilated.    Mitral Valve: There is mild regurgitation.    Aortic Valve: Trace to mild aortic valve regurgitation   Assessment and Plan:  Paroxysmal A-fib:  - Asymptomatic, DOE resolved after stopping amiodarone . PFTs in  2023 showed severe restrictive lung disease (interstitial) and moderate diffusion defect.  - EKG from June 2025 showed normal sinus rhythm - Continue carvedilol  25 mg twice daily and Coumadin  5 mg once daily. - DOAC's are cost prohibitive   CAD (CTO distal RCA and distal LAD), medical management: - No interval angina. - Continue current antianginal therapy, carvedilol  25 mg twice daily, Imdur  120 mg once daily, ranolazine was cost prohibitive. - Did not tolerate amlodipine  due to leg swelling. - Continue cardioprotective medications, not on aspirin  due to Coumadin  use and increase rosuvastatin  from 20 mg to 40 mg nightly in addition to Zetia  10 mg once daily.   HTN, poorly controlled:  - Home blood pressures range around 140 to 150 mmHg SBP. - Currently taking blood pressure medications one by one depending on blood pressure checks at home. - Continue current antihypertensives, valsartan  320 mg once daily, nifedipine  60 mg once daily, Imdur  120 mg once daily, carvedilol  25 mg twice daily and hydralazine  50 mg 3 times daily. - Renal artery Doppler pending.   AKI versus AKI on CKD: - Serum creatinine 1.3 in the last 2 months but prior to that it was normal.  Repeat BMP.   HLD, not at goal: - Increase rosuvastatin  from 20 mg to 40 mg nightly and Zetia  10 mg once daily.  - Goal LDL should be less than 55.  LDL 105 in May 2025.  Repeat fasting lipid panel in 3 months. - Repatha  cost prohibitive.  Leqvio, insurance denied.   Chronic diastolic heart failure: - Compensated. - Echocardiogram from 2022 at [facility] showed normal LVEF, moderate diastolic dysfunction with elevated LA pressure, mild MR and mild AR.    Valvular heart disease (mild MR and mild AR in 2022): - Repeat echocardiogram showed normal LVEF, G1 DD, normal RV function, mildly dilated LA, no valvular heart disease and CVP 3 mmHg.   Disposition:  Follow up 3 months  Signed, Tawnya Pujol Arleta Maywood, MD, 05/31/2024 11:38 AM     [facility] [address] [place], [place] [address]

## 2024-06-01 ENCOUNTER — Other Ambulatory Visit: Payer: Self-pay

## 2024-06-07 ENCOUNTER — Ambulatory Visit: Attending: Internal Medicine | Admitting: *Deleted

## 2024-06-07 DIAGNOSIS — I480 Paroxysmal atrial fibrillation: Secondary | ICD-10-CM

## 2024-06-07 DIAGNOSIS — Z5181 Encounter for therapeutic drug level monitoring: Secondary | ICD-10-CM

## 2024-06-07 LAB — POCT INR: INR: 2 (ref 2.0–3.0)

## 2024-06-07 MED ORDER — CARVEDILOL 25 MG PO TABS: 25.0000 mg | ORAL_TABLET | Freq: Two times a day (BID) | ORAL | 1 refills | Status: DC

## 2024-06-07 NOTE — Patient Instructions (Signed)
 Continue warfarin 1 tablet daily except 1/2 tablet on Mondays and Fridays.   Recheck in 2 weeks

## 2024-06-07 NOTE — Progress Notes (Signed)
Please see anticoagulation encounter.

## 2024-06-14 ENCOUNTER — Ambulatory Visit (HOSPITAL_COMMUNITY)
Admission: RE | Admit: 2024-06-14 | Discharge: 2024-06-14 | Disposition: A | Discharge status: Home / Self Care | Source: Ambulatory Visit | Attending: Internal Medicine | Admitting: Internal Medicine

## 2024-06-14 DIAGNOSIS — I10 Essential (primary) hypertension: Secondary | ICD-10-CM | POA: Insufficient documentation

## 2024-06-14 DIAGNOSIS — I701 Atherosclerosis of renal artery: Secondary | ICD-10-CM | POA: Diagnosis not present

## 2024-06-21 ENCOUNTER — Ambulatory Visit: Attending: Internal Medicine | Admitting: *Deleted

## 2024-06-21 DIAGNOSIS — Z5181 Encounter for therapeutic drug level monitoring: Secondary | ICD-10-CM | POA: Diagnosis not present

## 2024-06-21 DIAGNOSIS — I480 Paroxysmal atrial fibrillation: Secondary | ICD-10-CM

## 2024-06-21 LAB — POCT INR: INR: 3.2 — AB (ref 2.0–3.0)

## 2024-06-21 NOTE — Patient Instructions (Signed)
 Hold warfarin tonight then decrease dose to 1 tablet daily except 1/2 tablet on Mondays, Wednsdays and Fridays.   Recheck in 2 weeks

## 2024-06-21 NOTE — Progress Notes (Signed)
 INR 3.2. Please see anticoagulation encounter

## 2024-06-23 ENCOUNTER — Ambulatory Visit: Payer: Self-pay | Admitting: Internal Medicine

## 2024-06-23 DIAGNOSIS — I701 Atherosclerosis of renal artery: Secondary | ICD-10-CM

## 2024-06-23 NOTE — Telephone Encounter (Signed)
-----   Message from Vishnu P Mallipeddi sent at 06/23/2024  4:16 PM EDT ----- <59% renal artery stenosis bilaterally.  Serum creatinine mildly elevated in the last 3 months.  Place referral to vascular surgery. ----- Message ----- From: Interface, Three One Seven Sent: 06/14/2024   1:09 PM EDT To: Vishnu P Mallipeddi, MD

## 2024-06-23 NOTE — Telephone Encounter (Signed)
 Left detailed message and Sent MyChart message regarding results and referral placed.

## 2024-07-05 ENCOUNTER — Encounter

## 2024-07-06 ENCOUNTER — Ambulatory Visit: Attending: Internal Medicine | Admitting: *Deleted

## 2024-07-06 DIAGNOSIS — I480 Paroxysmal atrial fibrillation: Secondary | ICD-10-CM

## 2024-07-06 DIAGNOSIS — Z5181 Encounter for therapeutic drug level monitoring: Secondary | ICD-10-CM

## 2024-07-06 LAB — POCT INR: INR: 2.9 (ref 2.0–3.0)

## 2024-07-06 MED ORDER — ISOSORBIDE MONONITRATE ER 120 MG PO TB24: 120.0000 mg | ORAL_TABLET | Freq: Every day | ORAL | 1 refills | Status: DC

## 2024-07-06 NOTE — Patient Instructions (Signed)
 Continue warfarin 1 tablet daily except 1/2 tablet on Mondays, Wednsdays and Fridays.   Recheck in 3 weeks

## 2024-07-06 NOTE — Progress Notes (Signed)
 INR 2.9; Please see anticoagulation encounter

## 2024-07-20 ENCOUNTER — Ambulatory Visit: Payer: Medicare Other

## 2024-07-27 ENCOUNTER — Ambulatory Visit: Attending: Internal Medicine | Admitting: *Deleted

## 2024-07-27 DIAGNOSIS — I480 Paroxysmal atrial fibrillation: Secondary | ICD-10-CM | POA: Diagnosis not present

## 2024-07-27 DIAGNOSIS — Z5181 Encounter for therapeutic drug level monitoring: Secondary | ICD-10-CM | POA: Diagnosis not present

## 2024-07-27 LAB — POCT INR: INR: 1.5 — AB (ref 2.0–3.0)

## 2024-07-27 NOTE — Patient Instructions (Signed)
 Take warfarin 1 1/2 tablets tonight then resume 1 tablet daily except 1/2 tablet on Mondays, Wednsdays and Fridays.   Recheck in 2 weeks

## 2024-07-27 NOTE — Progress Notes (Signed)
 INR-1.5; Please see anticoagulation encounter

## 2024-08-01 ENCOUNTER — Ambulatory Visit (HOSPITAL_COMMUNITY): Admission: RE | Admit: 2024-08-01 | Source: Ambulatory Visit | Admitting: Physician Assistant

## 2024-08-01 NOTE — Progress Notes (Incomplete)
 Primary Care Physician: Bevely Doffing, FNP Primary Cardiologist: [name], MD Electrophysiologist: None  Referring Physician: Reche Finder NP   Robert Brown is a 73 y.o. male with a history of CAD, CHF, CVA, COPD, HTN, DM, OSA, atrial fibrillation who presents for follow up in the [facility].  Patient was hospitalized 3/31-02/17/2024 after presenting with chest pain found to have atrial fibrillation with RVR.  Placed on IV diltiazem  drip.  Returned to NSR. Started on amiodarone  given significant anginal symptoms on presentation. Diltiazem  stopped due to first degree heart block. Aspirin  was stopped and Eliquis  initiated for stroke prevention. Patient reported nausea at his follow up visit on 4/15 and his amiodarone  was decreased. Amiodarone  later discontinued due to h/o lung issues. Also transitioned to warfarin due to cost of DOACs.   Patient returns for follow up for atrial fibrillation ***  Today, he  denies symptoms of ***palpitations, chest pain, shortness of breath, orthopnea, PND, lower extremity edema, dizziness, presyncope, syncope, snoring, daytime somnolence, bleeding, or neurologic sequela. The patient is tolerating medications without difficulties and is otherwise without complaint today.    Atrial Fibrillation Risk Factors:  he does have symptoms or diagnosis of sleep apnea. he does not have a history of rheumatic fever. he does not have a history of alcohol  use.   Atrial Fibrillation Management history:  Previous antiarrhythmic drugs: amiodarone   Previous cardioversions: none Previous ablations: none Anticoagulation history: Eliquis   ROS- All systems are reviewed and negative except as per the HPI above.  Past Medical History:  Diagnosis Date   CAD (coronary artery disease)    Cancer (HCC)    Diabetes mellitus without complication (HCC)    Hypertension    Stroke [facility])     Current Outpatient Medications   Medication Sig Dispense Refill   albuterol  (PROVENTIL ) (2.5 MG/3ML) 0.083% nebulizer solution Take 3 mLs (2.5 mg total) by nebulization every 6 (six) hours as needed for wheezing or shortness of breath. 225 mL 3   albuterol  (VENTOLIN  HFA) 108 (90 Base) MCG/ACT inhaler Inhale 1-2 puffs into the lungs every 6 (six) hours as needed for shortness of breath or wheezing. 54 g 3   Blood Glucose Monitoring Suppl (ACCU-CHEK GUIDE) w/Device KIT Use to check glucose once daily. 1 kit 0   busPIRone  (BUSPAR ) 5 MG tablet Take 5 mg by mouth daily as needed (for anxiety).     carvedilol  (COREG ) 25 MG tablet Take 1 tablet (25 mg total) by mouth 2 (two) times daily. 180 tablet 1   Continuous Glucose Sensor (FREESTYLE LIBRE 3 PLUS SENSOR) MISC Change sensor every 15 days. 6 each 3   doxycycline  (VIBRAMYCIN ) 50 MG capsule Take 50 mg by mouth daily.     Evolocumab  (REPATHA  SURECLICK) 140 MG/ML SOAJ Inject 140 mg into the skin every 14 (fourteen) days. 2 mL 2   ezetimibe  (ZETIA ) 10 MG tablet Take 1 tablet (10 mg total) by mouth daily. 90 tablet 0   finasteride  (PROSCAR ) 5 MG tablet Take 5 mg by mouth daily.     Fluticasone -Umeclidin-Vilant (TRELEGY ELLIPTA ) 100-62.5-25 MCG/ACT AEPB One click each am 3 each 3   hydrALAZINE  (APRESOLINE ) 50 MG tablet Take 1 tablet (50 mg total) by mouth 3 (three) times daily. 270 tablet 3   isosorbide  mononitrate (IMDUR ) 120 MG 24 hr tablet Take 1 tablet (120 mg total) by mouth daily. 90 tablet 1   levothyroxine  (SYNTHROID ) 88 MCG tablet TAKE 1 TABLET BY MOUTH EVERY  MORNING 90 tablet  3   NIFEdipine  (PROCARDIA  XL/NIFEDICAL XL) 60 MG 24 hr tablet Take 1 tablet (60 mg total) by mouth daily. 30 tablet 5   nitroGLYCERIN  (NITROSTAT ) 0.4 MG SL tablet Place 1 tablet (0.4 mg total) under the tongue every 5 (five) minutes as needed for chest pain. If a single episode of chest pain is not relieved by one tablet, the patient will try another within 5 minutes; and if this doesn't relieve the pain, the  patient is instructed to call 911 for transportation to an emergency department. 25 tablet 3   ondansetron  (ZOFRAN -ODT) 4 MG disintegrating tablet Take 1 tablet (4 mg total) by mouth every 8 (eight) hours as needed for nausea or vomiting. 20 tablet 0   rosuvastatin  (CRESTOR ) 40 MG tablet Take 1 tablet (40 mg total) by mouth at bedtime. 30 tablet 6   Semaglutide , 1 MG/DOSE, (OZEMPIC , 1 MG/DOSE,) 2 MG/1.5ML SOPN Inject 1 mg into the skin every Tuesday.     tamsulosin  (FLOMAX ) 0.4 MG CAPS capsule Take 0.4 mg by mouth 2 (two) times daily.     valsartan  (DIOVAN ) 320 MG tablet Take 1 tablet (320 mg total) by mouth daily. 90 tablet 3   warfarin (COUMADIN ) 5 MG tablet TAKE 1/2 TO 1 TABLET BY MOUTH DAILY OR AS DIRECTED BY COUMADIN  CLINIC 30 tablet 3   No current facility-administered medications for this visit.    Physical Exam: There were no vitals taken for this visit.  GEN: Well nourished, well developed in no acute distress NECK: No JVD; No carotid bruits CARDIAC: {EPRHYTHM:[id number]}, no murmurs, rubs, gallops RESPIRATORY:  Clear to auscultation without rales, wheezing or rhonchi  ABDOMEN: Soft, non-tender, non-distended EXTREMITIES:  No edema; No deformity    Wt Readings from Last 3 Encounters:  05/31/24 98.4 kg  04/28/24 101.7 kg  04/27/24 100.6 kg     EKG today demonstrates  ***   Echo 02/16/24 demonstrated   1. Left ventricular ejection fraction, by estimation, is 65 to 70%. The  left ventricle has normal function. The left ventricle has no regional  wall motion abnormalities. There is moderate concentric left ventricular  hypertrophy. Left ventricular diastolic parameters are consistent with Grade I diastolic dysfunction (impaired relaxation).   2. Right ventricular systolic function is normal. The right ventricular  size is normal.   3. Left atrial size was mildly dilated.   4. The mitral valve is normal in structure. Trivial mitral valve  regurgitation. No evidence of mitral  stenosis.   5. The aortic valve is tricuspid. There is mild calcification of the  aortic valve. Aortic valve regurgitation is trivial. Aortic valve  sclerosis/calcification is present, without any evidence of aortic  stenosis.   6. The inferior vena cava is normal in size with greater than 50%  respiratory variability, suggesting right atrial pressure of 3 mmHg.    CHA2DS2-VASc Score = 7  The patient's score is based upon: CHF History: 1 HTN History: 1 Diabetes History: 1 Stroke History: 2 Vascular Disease History: 1 Age Score: 1 Gender Score: 0   {Confirm score is correct.  If not, click here to update score.  REFRESH note.  :1}    ASSESSMENT AND PLAN: Paroxysmal Atrial Fibrillation (ICD10:  I48.0) The patient's CHA2DS2-VASc score is 7, indicating a 11.2% annual risk of stroke.   Amiodarone  discontinued due to h/o abnormal PFTs.  ***dofetilide  Continue warfarin  Continue carvedilol  25 mg BID  Secondary Hypercoagulable State (ICD10:  D68.69){Click to add to Prob List or Visit Dx  :  [id number]} The patient is at significant risk for stroke/thromboembolism based upon his CHA2DS2-VASc Score of 7.  Continue Warfarin (Coumadin ). No bleeding issues.   Chronic HFpEF EF 65-70% GDMT per primary cardiology team Fluid status appears stable today ***  CAD Chronic total occlusion of distal RCA and LAD No anginal symptoms Followed by Dr Mallipeddi ***  HTN Stable on current regimen ***   Follow up ***with Almarie Crate as scheduled.    Daril Kicks PA-C [facility] [address] [place], [place] [address] [phone]

## 2024-08-01 NOTE — Progress Notes (Unsigned)
 [facility] [place]  ASSESSMENT / PLAN: 73 y.o. male with mild bilateral renal artery stenosis in the setting of difficult to control hypertension and mild chronic kidney disease.  I explained to the patient that in the absence of greater than 60% bilateral carotid artery stenosis, we should not consider intervention.  I will see him again in a year with repeat renal artery duplex.  CHIEF COMPLAINT: Renal artery stenosis  HISTORY OF PRESENT ILLNESS: Robert Brown is a 73 y.o. male referred to clinic for evaluation of renal artery stenosis.  This was discovered by Dr. Malllipeddi during workup for difficult to control hypertension.  The patient's only complaint is lethargy and easy fatigability, which she attributes to tight control of his blood pressure.  Patient does report some mild chronic kidney disease.  He has no other complaints today.  I counseled him about the typical findings necessary for renal artery intervention.  Past Medical History:  Diagnosis Date   CAD (coronary artery disease)    Cancer (HCC)    Diabetes mellitus without complication (HCC)    Hypertension    Stroke [facility])     Past Surgical History:  Procedure Laterality Date   bone grafts     TUMOR EXCISION      Family History  Problem Relation Age of Onset   Diabetes Mother    Diabetes Father     Social History   Socioeconomic History   Marital status: Married    Spouse name: Not on file   Number of children: Not on file   Years of education: Not on file   Highest education level: Bachelor's degree (e.g., BA, AB, BS)  Occupational History   Not on file  Tobacco Use   Smoking status: Former    Current packs/day: 0.00    Types: Cigarettes    Quit date: 11/17/2006    Years since quitting: 17.7   Smokeless tobacco: Never   Tobacco comments:    Former smoker 03/07/24  Vaping Use   Vaping status: Never Used  Substance and Sexual Activity   Alcohol  use: Not Currently     Comment: former drinker   Drug use: Never   Sexual activity: Not Currently  Other Topics Concern   Not on file  Social History Narrative   Not on file   Social Drivers of Health   Financial Resource Strain: High Risk (04/27/2024)   Overall Financial Resource Strain (CARDIA)    Difficulty of Paying Living Expenses: Hard  Food Insecurity: Food Insecurity Present (04/27/2024)   Hunger Vital Sign    Worried About Running Out of Food in the Last Year: Often true    Ran Out of Food in the Last Year: Sometimes true  Transportation Needs: No Transportation Needs (04/27/2024)   PRAPARE - Administrator, Civil Service (Medical): No    Lack of Transportation (Non-Medical): No  Physical Activity: Insufficiently Active (02/27/2022)   Received from [facility]   Exercise Vital Sign    On average, how many days per week do you engage in moderate to strenuous exercise (like a brisk walk)?: 1 day    On average, how many minutes do you engage in exercise at this level?: 60 min  Stress: Stress Concern Present (04/27/2024)   [employer] of Occupational Health - Occupational Stress Questionnaire    Feeling of Stress : To some extent  Social Connections: Socially Integrated (04/27/2024)   Social Connection and Isolation Panel    Frequency  of Communication with Friends and Family: More than three times a week    Frequency of Social Gatherings with Friends and Family: More than three times a week    Attends Religious Services: More than 4 times per year    Active Member of [employer] or Organizations: Yes    Attends Engineer, structural: More than 4 times per year    Marital Status: Married  Catering manager Violence: Not At Risk (02/16/2024)   Humiliation, Afraid, Rape, and Kick questionnaire    Fear of Current or Ex-Partner: No    Emotionally Abused: No    Physically Abused: No    Sexually Abused: No    Allergies  Allergen Reactions   Alcohol  Other (See Comments)    Prefers  not to drink   Lisinopril     Medicines ending in -pril - unknown reaction   Statins     myalgia    Current Outpatient Medications  Medication Sig Dispense Refill   albuterol  (PROVENTIL ) (2.5 MG/3ML) 0.083% nebulizer solution Take 3 mLs (2.5 mg total) by nebulization every 6 (six) hours as needed for wheezing or shortness of breath. 225 mL 3   albuterol  (VENTOLIN  HFA) 108 (90 Base) MCG/ACT inhaler Inhale 1-2 puffs into the lungs every 6 (six) hours as needed for shortness of breath or wheezing. 54 g 3   Blood Glucose Monitoring Suppl (ACCU-CHEK GUIDE) w/Device KIT Use to check glucose once daily. 1 kit 0   busPIRone  (BUSPAR ) 5 MG tablet Take 5 mg by mouth daily as needed (for anxiety).     carvedilol  (COREG ) 25 MG tablet Take 1 tablet (25 mg total) by mouth 2 (two) times daily. 180 tablet 1   Continuous Glucose Sensor (FREESTYLE LIBRE 3 PLUS SENSOR) MISC Change sensor every 15 days. 6 each 3   doxycycline  (VIBRAMYCIN ) 50 MG capsule Take 50 mg by mouth daily.     Evolocumab  (REPATHA  SURECLICK) 140 MG/ML SOAJ Inject 140 mg into the skin every 14 (fourteen) days. 2 mL 2   ezetimibe  (ZETIA ) 10 MG tablet Take 1 tablet (10 mg total) by mouth daily. 90 tablet 0   finasteride  (PROSCAR ) 5 MG tablet Take 5 mg by mouth daily.     Fluticasone -Umeclidin-Vilant (TRELEGY ELLIPTA ) 100-62.5-25 MCG/ACT AEPB One click each am 3 each 3   hydrALAZINE  (APRESOLINE ) 50 MG tablet Take 1 tablet (50 mg total) by mouth 3 (three) times daily. 270 tablet 3   isosorbide  mononitrate (IMDUR ) 120 MG 24 hr tablet Take 1 tablet (120 mg total) by mouth daily. 90 tablet 1   levothyroxine  (SYNTHROID ) 88 MCG tablet TAKE 1 TABLET BY MOUTH EVERY  MORNING 90 tablet 3   NIFEdipine  (PROCARDIA  XL/NIFEDICAL XL) 60 MG 24 hr tablet Take 1 tablet (60 mg total) by mouth daily. 30 tablet 5   nitroGLYCERIN  (NITROSTAT ) 0.4 MG SL tablet Place 1 tablet (0.4 mg total) under the tongue every 5 (five) minutes as needed for chest pain. If a single  episode of chest pain is not relieved by one tablet, the patient will try another within 5 minutes; and if this doesn't relieve the pain, the patient is instructed to call 911 for transportation to an emergency department. 25 tablet 3   ondansetron  (ZOFRAN -ODT) 4 MG disintegrating tablet Take 1 tablet (4 mg total) by mouth every 8 (eight) hours as needed for nausea or vomiting. 20 tablet 0   rosuvastatin  (CRESTOR ) 40 MG tablet Take 1 tablet (40 mg total) by mouth at bedtime. 30 tablet 6  Semaglutide , 1 MG/DOSE, (OZEMPIC , 1 MG/DOSE,) 2 MG/1.5ML SOPN Inject 1 mg into the skin every Tuesday.     tamsulosin  (FLOMAX ) 0.4 MG CAPS capsule Take 0.4 mg by mouth 2 (two) times daily.     valsartan  (DIOVAN ) 320 MG tablet Take 1 tablet (320 mg total) by mouth daily. 90 tablet 3   warfarin (COUMADIN ) 5 MG tablet TAKE 1/2 TO 1 TABLET BY MOUTH DAILY OR AS DIRECTED BY COUMADIN  CLINIC 30 tablet 3   No current facility-administered medications for this visit.    PHYSICAL EXAM Vitals:   08/02/24 1336  BP: (!) 131/55  Pulse: 73  SpO2: 98%  Weight: 214 lb (97.1 kg)  Height: 6' (1.829 m)   No acute distress Regular rate and rhythm Unlabored breathing   PERTINENT LABORATORY AND RADIOLOGIC DATA  Most recent CBC    Latest Ref Rng & Units 02/26/2024    1:40 PM 02/17/2024    5:08 AM 02/16/2024    7:01 AM  CBC  WBC 3.4 - 10.8 x10E3/uL 9.2  9.3  8.2   Hemoglobin 13.0 - 17.7 g/dL 86.9  87.3  86.8   Hematocrit 37.5 - 51.0 % 41.0  39.0  41.3   Platelets 150 - 450 x10E3/uL 156  172  162      Most recent CMP    Latest Ref Rng & Units 04/01/2024    3:21 PM 03/07/2024    2:34 PM 02/26/2024    1:40 PM  CMP  Glucose 70 - 99 mg/dL 91  885  866   BUN 8 - 27 mg/dL 19  12  15    Creatinine 0.76 - 1.27 mg/dL 8.64  8.65  8.67   Sodium 134 - 144 mmol/L 145  139  143   Potassium 3.5 - 5.2 mmol/L 3.5  3.4  3.9   Chloride 96 - 106 mmol/L 103  102  101   CO2 20 - 29 mmol/L 27  26  25    Calcium  8.6 - 10.2 mg/dL 9.2  8.6   9.2   Total Protein 6.0 - 8.5 g/dL 6.5     Total Bilirubin 0.0 - 1.2 mg/dL 0.3     Alkaline Phos 44 - 121 IU/L 116     AST 0 - 40 IU/L 32     ALT 0 - 44 IU/L 45       Renal function CrCl cannot be calculated (Patient's most recent lab result is older than the maximum 21 days allowed.).  Hemoglobin A1C (no units)  Date Value  08/04/2022 6.6   Hgb A1c MFr Bld (%)  Date Value  02/16/2024 6.6 (H)    LDL Chol Calc (NIH)  Date Value Ref Range Status  04/01/2024 105 (H) 0 - 99 mg/dL Final    ABDOMINAL VISCERAL   Patient Name:  Robert Brown  Date of Exam:   06/14/2024  Medical Rec #: [id number]             Accession #:    [id number]  Date of Birth: 17-Mar-1951              Patient Gender: M  Patient Age:   50 years  Exam Location:  [address]  Procedure:      VAS US  RENAL ARTERY DUPLEX  Referring Phys: [id number] VISHNU P MALLIPEDDI    ---------------------------------------------------------------------------  -----   Indications: Renal artery stenosis   High Risk Factors: Hypertension, Diabetes, prior CVA.   Comparison Study: None.   Performing Technologist: Garnette  Wedde     Examination Guidelines: A complete evaluation includes B-mode imaging,  spectral  Doppler, color Doppler, and power Doppler as needed of all accessible  portions  of each vessel. Bilateral testing is considered an integral part of a  complete  examination. Limited examinations for reoccurring indications may be  performed  as noted.     Duplex Findings:  +----------+--------+--------+------+--------+  MesentericPSV cm/sEDV cm/sPlaqueComments  +----------+--------+--------+------+--------+  Aorta Prox   80                           +----------+--------+--------+------+--------+           +------------------+--------+--------+-------+  Right Renal ArteryPSV cm/sEDV cm/sComment  +------------------+--------+--------+-------+  Origin              66      16             +------------------+--------+--------+-------+  Proximal            59      13            +------------------+--------+--------+-------+  Mid                131      27            +------------------+--------+--------+-------+  Distal             103      19            +------------------+--------+--------+-------+   +-----------------+--------+--------+-------+  Left Renal ArteryPSV cm/sEDV cm/sComment  +-----------------+--------+--------+-------+  Origin             45      4             +-----------------+--------+--------+-------+  Proximal           44      5             +-----------------+--------+--------+-------+  Mid                83      16            +-----------------+--------+--------+-------+  Distal             65      15            +-----------------+--------+--------+-------+   +------------+--------+--------+----+-----------+--------+--------+----+  Right KidneyPSV cm/sEDV cm/sRI  Left KidneyPSV cm/sEDV cm/sRI    +------------+--------+--------+----+-----------+--------+--------+----+  Upper Pole  33      9       0.73Upper Pole 34      9       0.74  +------------+--------+--------+----+-----------+--------+--------+----+  Mid        23      6       0.        24      7       0.71  +------------+--------+--------+----+-----------+--------+--------+----+  Lower Pole  28      8       0.71Lower Pole 28      6       0.79  +------------+--------+--------+----+-----------+--------+--------+----+  Hilar      39      8       0.79Hilar      62      16      0.74  +------------+--------+--------+----+-----------+--------+--------+----+   +------------------+-----+------------------+-----+  Right Kidney           Left Kidney              +------------------+-----+------------------+-----+  RAR                   RAR                       +------------------+-----+------------------+-----+  RAR (manual)      1.64 RAR (manual)      1.03   +------------------+-----+------------------+-----+  Cortex                Cortex                   +------------------+-----+------------------+-----+  Cortex thickness       Corex thickness          +------------------+-----+------------------+-----+  Kidney length (cm)12.74Kidney length (cm)13.59  +------------------+-----+------------------+-----+     Summary:  Renal:    Right: Normal size right kidney. 1-59% stenosis of the right renal         artery. RRV flow present. Normal right Resisitive Index.  Left:  Normal size of left kidney. Normal left Resistive Index.         1-59% stenosis of the left renal artery. LRV flow present.   Debby SAILOR. Magda, MD FACS [facility] Phone Number: [phone] 08/02/2024 2:08 PM   Total time spent on preparing this encounter including chart review, data review, collecting history, examining the patient, and coordinating care: 45 minutes  Portions of this report may have been transcribed using voice recognition software.  Every effort has been made to ensure accuracy; however, inadvertent computerized transcription errors may still be present.

## 2024-08-02 ENCOUNTER — Encounter (HOSPITAL_COMMUNITY): Payer: Self-pay

## 2024-08-02 ENCOUNTER — Encounter: Payer: Self-pay | Admitting: Vascular Surgery

## 2024-08-02 ENCOUNTER — Ambulatory Visit

## 2024-08-02 ENCOUNTER — Ambulatory Visit (INDEPENDENT_AMBULATORY_CARE_PROVIDER_SITE_OTHER): Admitting: Vascular Surgery

## 2024-08-02 VITALS — BP 131/55 | HR 73 | Ht 72.0 in | Wt 214.0 lb

## 2024-08-02 DIAGNOSIS — I701 Atherosclerosis of renal artery: Secondary | ICD-10-CM | POA: Diagnosis not present

## 2024-08-03 ENCOUNTER — Ambulatory Visit: Payer: Medicare Other

## 2024-08-04 DIAGNOSIS — E039 Hypothyroidism, unspecified: Secondary | ICD-10-CM | POA: Diagnosis not present

## 2024-08-05 LAB — COMPREHENSIVE METABOLIC PANEL WITH GFR
ALT: 16 IU/L (ref 0–44)
AST: 21 IU/L (ref 0–40)
Albumin: 3.9 g/dL (ref 3.8–4.8)
Alkaline Phosphatase: 90 IU/L (ref 47–123)
BUN/Creatinine Ratio: 12 (ref 10–24)
BUN: 15 mg/dL (ref 8–27)
Bilirubin Total: 0.3 mg/dL (ref 0.0–1.2)
CO2: 25 mmol/L (ref 20–29)
Calcium: 8.6 mg/dL (ref 8.6–10.2)
Chloride: 105 mmol/L (ref 96–106)
Creatinine, Ser: 1.29 mg/dL — ABNORMAL HIGH (ref 0.76–1.27)
Globulin, Total: 2.5 g/dL (ref 1.5–4.5)
Glucose: 88 mg/dL (ref 70–99)
Potassium: 4 mmol/L (ref 3.5–5.2)
Sodium: 143 mmol/L (ref 134–144)
Total Protein: 6.4 g/dL (ref 6.0–8.5)
eGFR: 59 mL/min/1.73 — ABNORMAL LOW (ref >59)

## 2024-08-05 LAB — T4, FREE: Free T4: 1.52 ng/dL (ref 0.82–1.77)

## 2024-08-05 LAB — TSH: TSH: 4.69 u[IU]/mL — ABNORMAL HIGH (ref 0.450–4.500)

## 2024-08-08 ENCOUNTER — Ambulatory Visit: Admitting: Nurse Practitioner

## 2024-08-08 ENCOUNTER — Encounter: Payer: Self-pay | Admitting: Nurse Practitioner

## 2024-08-08 VITALS — BP 130/80 | HR 90 | Ht 72.0 in | Wt 216.2 lb

## 2024-08-08 DIAGNOSIS — Z7985 Long-term (current) use of injectable non-insulin antidiabetic drugs: Secondary | ICD-10-CM

## 2024-08-08 DIAGNOSIS — E559 Vitamin D deficiency, unspecified: Secondary | ICD-10-CM | POA: Diagnosis not present

## 2024-08-08 DIAGNOSIS — E039 Hypothyroidism, unspecified: Secondary | ICD-10-CM

## 2024-08-08 DIAGNOSIS — Z7984 Long term (current) use of oral hypoglycemic drugs: Secondary | ICD-10-CM | POA: Diagnosis not present

## 2024-08-08 DIAGNOSIS — E119 Type 2 diabetes mellitus without complications: Secondary | ICD-10-CM | POA: Diagnosis not present

## 2024-08-08 LAB — POCT GLYCOSYLATED HEMOGLOBIN (HGB A1C): Hemoglobin A1C: 6.3 % — AB (ref 4.0–5.6)

## 2024-08-08 NOTE — Progress Notes (Signed)
 Endocrinology Follow Up Note       08/08/2024, 4:02 PM   Subjective:    Patient ID: Robert Brown, male    DOB: [birth year].  Robert Brown is being seen in follow up after being seen in consultation for management of currently uncontrolled symptomatic diabetes requested by  Bevely Doffing, FNP.   Past Medical History:  Diagnosis Date   CAD (coronary artery disease)    Cancer (HCC)    Diabetes mellitus without complication (HCC)    Hypertension    Stroke [facility])     Past Surgical History:  Procedure Laterality Date   bone grafts     TUMOR EXCISION      Social History   Socioeconomic History   Marital status: Married    Spouse name: Not on file   Number of children: Not on file   Years of education: Not on file   Highest education level: Bachelor's degree (e.g., BA, AB, BS)  Occupational History   Not on file  Tobacco Use   Smoking status: Former    Current packs/day: 0.00    Types: Cigarettes    Quit date: 11/17/2006    Years since quitting: 17.7   Smokeless tobacco: Never   Tobacco comments:    Former smoker 03/07/24  Vaping Use   Vaping status: Never Used  Substance and Sexual Activity   Alcohol  use: Not Currently    Comment: former drinker   Drug use: Never   Sexual activity: Not Currently  Other Topics Concern   Not on file  Social History Narrative   Not on file   Social Drivers of Health   Financial Resource Strain: High Risk (04/27/2024)   Overall Financial Resource Strain (CARDIA)    Difficulty of Paying Living Expenses: Hard  Food Insecurity: Food Insecurity Present (04/27/2024)   Hunger Vital Sign    Worried About Running Out of Food in the Last Year: Often true    Ran Out of Food in the Last Year: Sometimes true  Transportation Needs: No Transportation Needs (04/27/2024)   PRAPARE - Administrator, Civil Service (Medical): No    Lack of Transportation  (Non-Medical): No  Physical Activity: Insufficiently Active (02/27/2022)   [facility] from [facility]   Exercise Vital Sign    On average, how many days per week do you engage in moderate to strenuous exercise (like a brisk walk)?: 1 day    On average, how many minutes do you engage in exercise at this level?: 60 min  Stress: Stress Concern Present (04/27/2024)   [employer] of Occupational Health - Occupational Stress Questionnaire    Feeling of Stress : To some extent  Social Connections: Socially Integrated (04/27/2024)   Social Connection and Isolation Panel    Frequency of Communication with Friends and Family: More than three times a week    Frequency of Social Gatherings with Friends and Family: More than three times a week    Attends Religious Services: More than 4 times per year    Active Member of [employer] or Organizations: Yes    Attends Banker Meetings: More than 4 times per year    Marital  Status: Married    Family History  Problem Relation Age of Onset   Diabetes Mother    Diabetes Father     Outpatient Encounter Medications as of 08/08/2024  Medication Sig   albuterol  (PROVENTIL ) (2.5 MG/3ML) 0.083% nebulizer solution Take 3 mLs (2.5 mg total) by nebulization every 6 (six) hours as needed for wheezing or shortness of breath.   albuterol  (VENTOLIN  HFA) 108 (90 Base) MCG/ACT inhaler Inhale 1-2 puffs into the lungs every 6 (six) hours as needed for shortness of breath or wheezing.   Blood Glucose Monitoring Suppl (ACCU-CHEK GUIDE) w/Device KIT Use to check glucose once daily.   busPIRone  (BUSPAR ) 5 MG tablet Take 5 mg by mouth daily as needed (for anxiety).   carvedilol  (COREG ) 25 MG tablet Take 1 tablet (25 mg total) by mouth 2 (two) times daily.   Continuous Glucose Sensor (FREESTYLE LIBRE 3 PLUS SENSOR) MISC Change sensor every 15 days.   finasteride  (PROSCAR ) 5 MG tablet Take 5 mg by mouth daily.   hydrALAZINE  (APRESOLINE ) 50 MG tablet Take 1 tablet (50  mg total) by mouth 3 (three) times daily.   isosorbide  mononitrate (IMDUR ) 120 MG 24 hr tablet Take 1 tablet (120 mg total) by mouth daily.   levothyroxine  (SYNTHROID ) 88 MCG tablet TAKE 1 TABLET BY MOUTH EVERY  MORNING   nitroGLYCERIN  (NITROSTAT ) 0.4 MG SL tablet Place 1 tablet (0.4 mg total) under the tongue every 5 (five) minutes as needed for chest pain. If a single episode of chest pain is not relieved by one tablet, the patient will try another within 5 minutes; and if this doesn't relieve the pain, the patient is instructed to call 911 for transportation to an emergency department.   ondansetron  (ZOFRAN -ODT) 4 MG disintegrating tablet Take 1 tablet (4 mg total) by mouth every 8 (eight) hours as needed for nausea or vomiting.   rosuvastatin  (CRESTOR ) 40 MG tablet Take 1 tablet (40 mg total) by mouth at bedtime.   Semaglutide , 1 MG/DOSE, (OZEMPIC , 1 MG/DOSE,) 2 MG/1.5ML SOPN Inject 1 mg into the skin every Tuesday.   tamsulosin  (FLOMAX ) 0.4 MG CAPS capsule Take 0.4 mg by mouth 2 (two) times daily.   valsartan  (DIOVAN ) 320 MG tablet Take 1 tablet (320 mg total) by mouth daily.   warfarin (COUMADIN ) 5 MG tablet TAKE 1/2 TO 1 TABLET BY MOUTH DAILY OR AS DIRECTED BY COUMADIN  CLINIC   doxycycline  (VIBRAMYCIN ) 50 MG capsule Take 50 mg by mouth daily. (Patient not taking: Reported on 08/08/2024)   Evolocumab  (REPATHA  SURECLICK) 140 MG/ML SOAJ Inject 140 mg into the skin every 14 (fourteen) days. (Patient not taking: Reported on 08/08/2024)   ezetimibe  (ZETIA ) 10 MG tablet Take 1 tablet (10 mg total) by mouth daily. (Patient not taking: Reported on 08/08/2024)   Fluticasone -Umeclidin-Vilant (TRELEGY ELLIPTA ) 100-62.5-25 MCG/ACT AEPB One click each am (Patient not taking: Reported on 08/08/2024)   NIFEdipine  (PROCARDIA  XL/NIFEDICAL XL) 60 MG 24 hr tablet Take 1 tablet (60 mg total) by mouth daily. (Patient not taking: Reported on 08/08/2024)   No facility-administered encounter medications on file as of  08/08/2024.    ALLERGIES: Allergies  Allergen Reactions   Alcohol  Other (See Comments)    Prefers not to drink   Lisinopril     Medicines ending in -pril - unknown reaction   Statins     myalgia    VACCINATION STATUS: Immunization History  Administered Date(s) Administered   Fluad Quad(high Dose 65+) 09/10/2020   Fluad Trivalent(High Dose 65+) 10/13/2023  INFLUENZA, HIGH DOSE SEASONAL PF 09/06/2018, 10/11/2019, 08/28/2021   PFIZER Comirnaty(Gray Top)Covid-19 Tri-Sucrose Vaccine 03/12/2020, 04/06/2020, 10/22/2020, 04/01/2021   PFIZER(Purple Top)SARS-COV-2 Vaccination 03/12/2020, 04/06/2020, 10/22/2020   Pfizer Covid-19 Vaccine Bivalent Booster 83yrs & up 09/04/2021   Pneumococcal Conjugate-13 06/18/2015   Pneumococcal Polysaccharide-23 06/23/2017, 06/23/2017   Tdap 02/22/2020    Diabetes He presents for his follow-up diabetic visit. He has type 2 diabetes mellitus. His disease course has been stable. There are no hypoglycemic associated symptoms. There are no diabetic associated symptoms. There are no hypoglycemic complications. Symptoms are stable. Diabetic complications include a CVA, heart disease (CAD and CHF), nephropathy and peripheral neuropathy. Risk factors for coronary artery disease include diabetes mellitus, dyslipidemia, family history, obesity, male sex, hypertension and sedentary lifestyle. Current diabetic treatments: Ozempic  only. He is compliant with treatment most of the time. His weight is fluctuating minimally. He is following a generally healthy diet. When asked about meal planning, he reported none. He has not had a previous visit with a dietitian. He rarely participates in exercise. His home blood glucose trend is decreasing steadily. His overall blood glucose range is 90-110 mg/dl. (He presents today with his CGM showing no data since 9/15 (insurance stopped paying due to discontinuation of insulin ) but at goal glycemic profile overall.  His POCT A1c today is  6.2%, improving from last visit of 6.6%.  He denies any significant hypoglycemia.   Analysis of his CGM shows TIR 92%, TAR 3%, TBR 5%. ) An ACE inhibitor/angiotensin II receptor blocker is being taken. He does not see a podiatrist.Eye exam is current.     Review of systems  Constitutional: +stable body weight, current Body mass index is 29.32 kg/m., no fatigue, no subjective hyperthermia, no subjective hypothermia Eyes: no blurry vision, no xerophthalmia ENT: no sore throat, no nodules palpated in throat, no dysphagia/odynophagia, no hoarseness Cardiovascular: no chest pain, no shortness of breath, no palpitations, no leg swelling Respiratory: no cough, no shortness of breath Gastrointestinal: no nausea/vomiting/diarrhea Musculoskeletal: no muscle/joint aches Skin: no rashes, no hyperemia Neurological: no tremors, no numbness, no tingling, no dizziness Psychiatric: no depression, no anxiety  Objective:     BP 130/80 (BP Location: Left Arm, Patient Position: Sitting, Cuff Size: Large)   Pulse 90   Ht 6' (1.829 m)   Wt 216 lb 3.2 oz (98.1 kg)   BMI 29.32 kg/m   Wt Readings from Last 3 Encounters:  08/08/24 216 lb 3.2 oz (98.1 kg)  08/02/24 214 lb (97.1 kg)  05/31/24 217 lb (98.4 kg)     BP Readings from Last 3 Encounters:  08/08/24 130/80  08/02/24 (!) 131/55  05/31/24 (!) 180/72      Physical Exam- Limited  Constitutional:  Body mass index is 29.32 kg/m. , not in acute distress, normal state of mind Eyes:  EOMI, no exophthalmos Musculoskeletal: no gross deformities, strength intact in all four extremities, no gross restriction of joint movements Skin:  no rashes, no hyperemia Neurological: no tremor with outstretched hands   Diabetic Foot Exam - Simple   No data filed      CMP ( most recent) CMP     Component Value Date/Time   NA 143 08/04/2024 1445   K 4.0 08/04/2024 1445   CL 105 08/04/2024 1445   CO2 25 08/04/2024 1445   GLUCOSE 88 08/04/2024 1445    GLUCOSE 114 (H) 03/07/2024 1434   BUN 15 08/04/2024 1445   CREATININE 1.29 (H) 08/04/2024 1445   CALCIUM  8.6 08/04/2024 1445  PROT 6.4 08/04/2024 1445   ALBUMIN 3.9 08/04/2024 1445   AST 21 08/04/2024 1445   ALT 16 08/04/2024 1445   ALKPHOS 90 08/04/2024 1445   BILITOT 0.3 08/04/2024 1445   GFRNONAA 56 (L) 03/07/2024 1434   GFRAA >60 06/21/2020 1510     Diabetic Labs (most recent): Lab Results  Component Value Date   HGBA1C 6.3 (A) 08/08/2024   HGBA1C 6.6 (H) 02/16/2024   HGBA1C 9.4 (A) 12/08/2023   MICROALBUR 30mg /L 08/31/2023     Lipid Panel ( most recent) Lipid Panel     Component Value Date/Time   CHOL 175 04/01/2024 1521   TRIG 118 04/01/2024 1521   HDL 49 04/01/2024 1521   CHOLHDL 3.6 04/01/2024 1521   LDLCALC 105 (H) 04/01/2024 1521   LABVLDL 21 04/01/2024 1521      Lab Results  Component Value Date   TSH 4.690 (H) 08/04/2024   TSH 5.110 (H) 04/01/2024   TSH 3.560 02/26/2024   TSH 6.043 (H) 02/15/2024   TSH 3.110 08/24/2023   TSH 3.96 08/04/2022   FREET4 1.52 08/04/2024   FREET4 1.61 04/01/2024   FREET4 1.62 02/26/2024   FREET4 0.92 02/15/2024   FREET4 1.33 08/24/2023         Latest Reference Range & Units 02/15/24 22:15 02/26/24 13:40 04/01/24 15:21 08/04/24 14:45  TSH 0.450 - 4.500 uIU/mL 6.043 (H) 3.560 5.110 (H) 4.690 (H)  T4,Free(Direct) 0.82 - 1.77 ng/dL 9.07 8.37 8.38 8.47  (H): Data is abnormally high   Assessment & Plan:   1) Type 2 Diabetes mellitus without complication without long-term current use of insulin   He presents today with his CGM showing no data since 9/15 (insurance stopped paying due to discontinuation of insulin ) but at goal glycemic profile overall.  His POCT A1c today is 6.2%, improving from last visit of 6.6%.  He denies any significant hypoglycemia.   Analysis of his CGM shows TIR 92%, TAR 3%, TBR 5%.   - Robert Brown has currently uncontrolled symptomatic type 2 DM since 73 years of age.   -Recent labs  reviewed.  - I had a long discussion with him about the progressive nature of diabetes and the pathology behind its complications. -his diabetes is complicated by CAD with MI, CVA, CKD stage 2, neuropathy and he remains at a high risk for more acute and chronic complications which include CAD, CVA, CKD, retinopathy, and neuropathy. These are all discussed in detail with him.  The following Lifestyle Medicine recommendations according to American College of Lifestyle Medicine [facility]) were discussed and offered to patient and he agrees to start the journey:  A. Whole Foods, Plant-based plate comprising of fruits and vegetables, plant-based proteins, whole-grain carbohydrates was discussed in detail with the patient.   A list for source of those nutrients were also provided to the patient.  Patient will use only water or unsweetened tea for hydration. B.  The need to stay away from risky substances including alcohol , smoking; obtaining 7 to 9 hours of restorative sleep, at least 150 minutes of moderate intensity exercise weekly, the importance of healthy social connections,  and stress reduction techniques were discussed. C.  A full color page of  Calorie density of various food groups per pound showing examples of each food groups was provided to the patient.  - Nutritional counseling repeated at each appointment due to patients tendency to fall back in to old habits.  - The patient admits there is a room for improvement in their  diet and drink choices. -  Suggestion is made for the patient to avoid simple carbohydrates from their diet including Cakes, Sweet Desserts / Pastries, Ice Cream, Soda (diet and regular), Sweet Tea, Candies, Chips, Cookies, Sweet Pastries, Store Bought Juices, Alcohol  in Excess of 1-2 drinks a day, Artificial Sweeteners, Coffee Creamer, and Sugar-free Products. This will help patient to have stable blood glucose profile and potentially avoid unintended weight gain.   - I  encouraged the patient to switch to unprocessed or minimally processed complex starch and increased protein intake (animal or plant source), fruits, and vegetables.   - Patient is advised to stick to a routine mealtimes to eat 3 meals a day and avoid unnecessary snacks (to snack only to correct hypoglycemia).  - I have approached him with the following individualized plan to manage his diabetes and patient agrees:   -He is advised to continue Ozempic  1 mg SQ weekly.  He could most certainly benefit from GLP1 product given his extensive CAD history with MI and CVA in the past.  He does note some change in vision, wonders if the Ozempic  is contributing to this.  We did talk about retinopathy and how GLP1 products can worsen this.  He notes he does have eye appt coming up soon to evaluate.  -he can take a break from routine monitoring for now as he is on safe regimen.   His insurance did not cover CGM due to not being on insulin .  He does have a few sensors left over- I encouraged him to use them during the holiday season so he can keep an eye on sugar during some of the toughest months.  - he is warned not to take insulin  without proper monitoring per orders. - Adjustment parameters are given to him for hypo and hyperglycemia in writing.  - he is not a candidate for full dose Metformin  due to concurrent renal insufficiency.  - Specific targets for  A1c; LDL, HDL, and Triglycerides were discussed with the patient.  2) Blood Pressure /Hypertension:  his blood pressure is controlled to target.   he is advised to continue his current medications as prescribed by PCP/cardiology.  3) Lipids/Hyperlipidemia:    Review of his recent lipid panel from 04/01/24 showed uncontrolled LDL at 105 (improving).  he is advised to continue Crestor  20 mg daily at bedtime.  Side effects and precautions discussed with him.  His cardiologist is looking in to getting an injectable for him through PAP.  4)  Weight/Diet:   his Body mass index is 29.32 kg/m.  -  clearly complicating his diabetes care.   he is a candidate for weight loss. I discussed with him the fact that loss of 5 - 10% of his  current body weight will have the most impact on his diabetes management.  Exercise, and detailed carbohydrates information provided  -  detailed on discharge instructions.  5) Hypothyroidism-unspecified The details surrounding his diagnosis are not available.  He denies any known family history of thyroid  problems.   His previsit TFTs are consistent with appropriate hormone replacement (TSH is high but Free T4 is normal).  He is also on Amiodarone  for Afib which is known to throw off TSH.  He is advised to continue Levothyroxine  88 mcg po daily before breakfast.  Will recheck TFTs prior to next visit and adjust dose accordingly.   - The correct intake of thyroid  hormone (Levothyroxine , Synthroid ), is on empty stomach first thing in the morning, with water, separated by  at least 30 minutes from breakfast and other medications,  and separated by more than 4 hours from calcium , iron, multivitamins, acid reflux medications (PPIs).  - This medication is a life-long medication and will be needed to correct thyroid  hormone imbalances for the rest of your life.  The dose may change from time to time, based on thyroid  blood work.  - It is extremely important to be consistent taking this medication, near the same time each morning.  -AVOID TAKING PRODUCTS CONTAINING BIOTIN (commonly found in Hair, Skin, Nails vitamins) AS IT INTERFERES WITH THE VALIDITY OF THYROID  FUNCTION BLOOD TESTS.  6) Chronic Care/Health Maintenance: -he is on ACEI/ARB and Statin medications and is encouraged to initiate and continue to follow up with Ophthalmology, Dentist, Podiatrist at least yearly or according to recommendations, and advised to stay away from smoking. I have recommended yearly flu vaccine and pneumonia vaccine at least every 5 years; moderate  intensity exercise for up to 150 minutes weekly; and sleep for at least 7 hours a day.  - he is advised to maintain close follow up with Bevely Doffing, FNP for primary care needs, as well as his other providers for optimal and coordinated care.      I spent  48  minutes in the care of the patient today including review of labs from CMP, Lipids, Thyroid  Function, Hematology (current and previous including abstractions from other facilities); face-to-face time discussing  his blood glucose readings/logs, discussing hypoglycemia and hyperglycemia episodes and symptoms, medications doses, his options of short and long term treatment based on the latest standards of care / guidelines;  discussion about incorporating lifestyle medicine;  and documenting the encounter. Risk reduction counseling performed per USPSTF guidelines to reduce obesity and cardiovascular risk factors.     Please refer to Patient Instructions for Blood Glucose Monitoring and Insulin /Medications Dosing Guide  in media tab for additional information. Please  also refer to  Patient Self Inventory in the Media  tab for reviewed elements of pertinent patient history.  Robert Brown participated in the discussions, expressed understanding, and voiced agreement with the above plans.  All questions were answered to his satisfaction. he is encouraged to contact clinic should he have any questions or concerns prior to his return visit.     Follow up plan: - Return in about 4 months (around 12/08/2024) for Diabetes F/U with A1c in office, No previsit labs, Thyroid  follow up.  Benton Rio, [facility] [address] [place], [place] [address] Phone: [phone] Fax: [phone]  08/08/2024, 4:02 PM

## 2024-08-10 ENCOUNTER — Ambulatory Visit: Attending: Internal Medicine | Admitting: *Deleted

## 2024-08-10 DIAGNOSIS — I480 Paroxysmal atrial fibrillation: Secondary | ICD-10-CM

## 2024-08-10 DIAGNOSIS — Z5181 Encounter for therapeutic drug level monitoring: Secondary | ICD-10-CM | POA: Diagnosis not present

## 2024-08-10 LAB — POCT INR: INR: 1.8 — AB (ref 2.0–3.0)

## 2024-08-10 NOTE — Progress Notes (Signed)
 INR 1.8. Please see anticoagulation encounter

## 2024-08-10 NOTE — Patient Instructions (Signed)
 Take warfarin 1 tablet tonight then resume 1 tablet daily except 1/2 tablet on Mondays, Wednsdays and Fridays.   Recheck in 3 weeks Missed warfarin last night

## 2024-08-15 ENCOUNTER — Other Ambulatory Visit: Payer: Self-pay | Admitting: Internal Medicine

## 2024-08-15 ENCOUNTER — Telehealth: Payer: Self-pay | Admitting: Nurse Practitioner

## 2024-08-15 NOTE — Telephone Encounter (Signed)
 Let pt know PAP of Ozempic  is ready for pick up

## 2024-08-17 NOTE — Telephone Encounter (Signed)
 Pt picked up PAP of ozempic 

## 2024-08-23 ENCOUNTER — Encounter: Payer: Self-pay | Admitting: *Deleted

## 2024-08-23 NOTE — Progress Notes (Signed)
 Robert Brown                                          MRN: [id number]   08/23/2024   The VBCI Quality Team Specialist reviewed this patient medical record for the purposes of chart review for care gap closure. The following were reviewed: chart review for care gap closure-kidney health evaluation for diabetes:eGFR  and uACR.    VBCI Quality Team

## 2024-08-29 ENCOUNTER — Ambulatory Visit: Admitting: Nurse Practitioner

## 2024-08-29 DIAGNOSIS — H04123 Dry eye syndrome of bilateral lacrimal glands: Secondary | ICD-10-CM | POA: Diagnosis not present

## 2024-08-31 ENCOUNTER — Ambulatory Visit: Attending: Internal Medicine | Admitting: *Deleted

## 2024-08-31 DIAGNOSIS — Z5181 Encounter for therapeutic drug level monitoring: Secondary | ICD-10-CM

## 2024-08-31 DIAGNOSIS — I480 Paroxysmal atrial fibrillation: Secondary | ICD-10-CM | POA: Diagnosis not present

## 2024-08-31 LAB — POCT INR: INR: 1.9 — AB (ref 2.0–3.0)

## 2024-08-31 NOTE — Patient Instructions (Signed)
Increase warfarin to 1 tablet daily except 1/2 tablet on Mondays and Fridays Recheck in 4 weeks 

## 2024-08-31 NOTE — Progress Notes (Signed)
 INR 1.9; Please see anticoagulation encounter

## 2024-09-02 ENCOUNTER — Ambulatory Visit: Admitting: Nurse Practitioner

## 2024-09-02 ENCOUNTER — Encounter: Payer: Self-pay | Admitting: Nurse Practitioner

## 2024-09-02 NOTE — Progress Notes (Deleted)
  Cardiology Office Note   Date:  09/02/2024  ID:  Melik Blancett, DOB [birth year], MRN [id number] PCP: Bevely Doffing, FNP  [facility] Providers Cardiologist:  [name], MD { Click to update primary MD,subspecialty MD or APP then REFRESH:1}    History of Present Illness Robert Brown is a 73 y.o. male with a PMH of CAD, PAF, hypertension, type 2 diabetes, history of CVA, who presents today for 3 month follow-up.   Previous cardiovascular history of CTO distal RCA and distal LAD, normal LVEF.  Collateralization was noted, medical management was recommended with aggressive antianginal therapy.  Was admitted to [facility] in April 2025 for new onset A-fib with RVR, loaded with amiodarone  and discharged on p.o. amiodarone .  Amiodarone  later discontinued due to evidence of PFTs from 2023 noting severe restrictive lung disease and moderate diffusion defect.  Last seen by Dr. Mallipeddi on May 31, 2024.  He noted improved DOE, denied any chest pain.  Home Bps showed average SBP around 140s to 150s.  Renal artery duplex showed less than 59% renal artery stenosis bilaterally and serum creatinine had been mildly elevated in the last several months, verbal order was given for referral for vascular surgery.  Overall was doing well at the time.   Today he is here for 76-month follow-up appointment.  He states ROS: ***  Studies Reviewed      *** Risk Assessment/Calculations {Does this patient have ATRIAL FIBRILLATION?:[phone]} No BP recorded.  {Refresh Note OR Click here to enter BP  :1}***       Physical Exam VS:  There were no vitals taken for this visit.       Wt Readings from Last 3 Encounters:  08/08/24 216 lb 3.2 oz (98.1 kg)  08/02/24 214 lb (97.1 kg)  05/31/24 217 lb (98.4 kg)    GEN: Well nourished, well developed in no acute distress NECK: No JVD; No carotid bruits CARDIAC: ***RRR, no murmurs, rubs, gallops RESPIRATORY:  Clear to auscultation  without rales, wheezing or rhonchi  ABDOMEN: Soft, non-tender, non-distended EXTREMITIES:  No edema; No deformity   ASSESSMENT AND PLAN ***    {Are you ordering a CV Procedure (e.g. stress test, cath, DCCV, TEE, etc)?   Press F2        :[id number]}  Dispo: ***  Signed, Almarie Crate, NP

## 2024-09-12 ENCOUNTER — Ambulatory Visit: Admitting: Internal Medicine

## 2024-09-14 ENCOUNTER — Ambulatory Visit: Admitting: Internal Medicine

## 2024-09-27 ENCOUNTER — Telehealth: Payer: Self-pay

## 2024-09-27 NOTE — Telephone Encounter (Signed)
 Copied from CRM [id number]. Topic: Clinical - Order For Equipment >> Sep 26, 2024  2:01 PM Robert Brown wrote: Reason for CRM: Patient  has been waiting since June/July for his cpap supplies, he is reusing old equipment that is not working properly- [phone]

## 2024-09-27 NOTE — Telephone Encounter (Signed)
 Patient is scheduled with Dr. Nance and information has been mailed to the patient

## 2024-09-27 NOTE — Telephone Encounter (Signed)
 Pt needs sleep follow up in [place]

## 2024-09-28 ENCOUNTER — Ambulatory Visit: Attending: Internal Medicine | Admitting: *Deleted

## 2024-09-28 DIAGNOSIS — Z5181 Encounter for therapeutic drug level monitoring: Secondary | ICD-10-CM | POA: Diagnosis not present

## 2024-09-28 DIAGNOSIS — I480 Paroxysmal atrial fibrillation: Secondary | ICD-10-CM | POA: Diagnosis not present

## 2024-09-28 LAB — POCT INR: INR: 1.9 — AB (ref 2.0–3.0)

## 2024-09-28 NOTE — Progress Notes (Signed)
 INR 1.9; Please see anticoagulation encounter

## 2024-09-28 NOTE — Telephone Encounter (Signed)
 Patient rescheduled to 1/7 w/ Alghanim.

## 2024-09-28 NOTE — Telephone Encounter (Signed)
 Pt has appt scheduled in Feb with dr Catherine - can see if we have any closer appts for pt

## 2024-09-28 NOTE — Patient Instructions (Signed)
 Increase warfarin to 1 tablet daily except 1/2 tablet on Fridays.   Recheck in 4 weeks

## 2024-10-24 ENCOUNTER — Other Ambulatory Visit (HOSPITAL_BASED_OUTPATIENT_CLINIC_OR_DEPARTMENT_OTHER): Payer: Self-pay | Admitting: Family

## 2024-10-25 NOTE — Progress Notes (Signed)
 Robert Brown                                          MRN: [id number]   10/25/2024   The VBCI Quality Team Specialist reviewed this patient medical record for the purposes of chart review for care gap closure. The following were reviewed: abstraction for care gap closure-glycemic status assessment.    VBCI Quality Team

## 2024-10-25 NOTE — Progress Notes (Signed)
 Robert Brown                                          MRN: [id number]   10/25/2024   The [facility] Quality Team Specialist reviewed this patient medical record for the purposes of chart review for care gap closure. The following were reviewed: chart review for care gap closure-kidney health evaluation for diabetes:eGFR  and uACR.    [facility] Quality Team

## 2024-10-26 ENCOUNTER — Ambulatory Visit: Attending: Internal Medicine | Admitting: *Deleted

## 2024-10-26 DIAGNOSIS — Z5181 Encounter for therapeutic drug level monitoring: Secondary | ICD-10-CM

## 2024-10-26 DIAGNOSIS — I480 Paroxysmal atrial fibrillation: Secondary | ICD-10-CM

## 2024-10-26 LAB — POCT INR: INR: 4.4 — AB (ref 2.0–3.0)

## 2024-10-26 NOTE — Patient Instructions (Signed)
 Hold warfarin tonight, take 1/2 tablet tomorrow night then resume 1 tablet daily except 1/2 tablet on Fridays.   Recheck in 2 weeks

## 2024-10-26 NOTE — Progress Notes (Signed)
 INR 4.4 Please see anticoagulation encounter

## 2024-11-08 ENCOUNTER — Ambulatory Visit: Attending: Internal Medicine | Admitting: *Deleted

## 2024-11-08 DIAGNOSIS — I480 Paroxysmal atrial fibrillation: Secondary | ICD-10-CM | POA: Diagnosis not present

## 2024-11-08 DIAGNOSIS — Z5181 Encounter for therapeutic drug level monitoring: Secondary | ICD-10-CM | POA: Diagnosis not present

## 2024-11-08 LAB — POCT INR: INR: 1.8 — AB (ref 2.0–3.0)

## 2024-11-08 NOTE — Progress Notes (Signed)
 INR 1.8. Please see anticoagulation encounter

## 2024-11-08 NOTE — Patient Instructions (Signed)
 Take warfarin 1 1/2 tablets tonight then resume 1 tablet daily except 1/2 tablet on Fridays.   Recheck in 3 weeks

## 2024-11-11 ENCOUNTER — Ambulatory Visit

## 2024-11-11 ENCOUNTER — Other Ambulatory Visit: Payer: Self-pay

## 2024-11-11 ENCOUNTER — Telehealth: Payer: Self-pay | Admitting: Internal Medicine

## 2024-11-11 DIAGNOSIS — I480 Paroxysmal atrial fibrillation: Secondary | ICD-10-CM

## 2024-11-11 MED ORDER — WARFARIN SODIUM 5 MG PO TABS: ORAL_TABLET | ORAL | 3 refills | Status: AC

## 2024-11-11 NOTE — Telephone Encounter (Signed)
" ° °*  STAT* If patient is at the pharmacy, call can be transferred to refill team.   1. Which medications need to be refilled? (please list name of each medication and dose if known)  warfarin (COUMADIN ) 5 MG tablet   2. Which pharmacy/location (including street and city if local pharmacy) is medication to be sent to?  [id number] - [place], [place] - [address]    3. Do they need a 30 day or 90 day supply? 30  "

## 2024-11-18 ENCOUNTER — Other Ambulatory Visit: Payer: Self-pay | Admitting: Internal Medicine

## 2024-11-22 ENCOUNTER — Ambulatory Visit: Admitting: Physician Assistant

## 2024-11-23 ENCOUNTER — Encounter: Admitting: Pulmonary Disease

## 2024-11-23 DIAGNOSIS — G4733 Obstructive sleep apnea (adult) (pediatric): Secondary | ICD-10-CM

## 2024-11-29 ENCOUNTER — Ambulatory Visit: Payer: Self-pay | Attending: Internal Medicine | Admitting: *Deleted

## 2024-11-29 DIAGNOSIS — Z5181 Encounter for therapeutic drug level monitoring: Secondary | ICD-10-CM

## 2024-11-29 DIAGNOSIS — I480 Paroxysmal atrial fibrillation: Secondary | ICD-10-CM

## 2024-11-29 LAB — POCT INR: INR: 2.1 (ref 2.0–3.0)

## 2024-11-29 NOTE — Progress Notes (Signed)
 INR 2.1; Please see anticoagulation encounter

## 2024-11-29 NOTE — Patient Instructions (Signed)
Continue warfarin 1 tablet daily except 1/2 tablet on Fridays Recheck in 4 weeks 

## 2024-11-30 ENCOUNTER — Encounter (HOSPITAL_BASED_OUTPATIENT_CLINIC_OR_DEPARTMENT_OTHER): Payer: Self-pay

## 2024-11-30 ENCOUNTER — Ambulatory Visit (HOSPITAL_BASED_OUTPATIENT_CLINIC_OR_DEPARTMENT_OTHER)

## 2024-11-30 VITALS — BP 133/78 | HR 79 | Ht 72.0 in | Wt 214.0 lb

## 2024-11-30 DIAGNOSIS — Z87891 Personal history of nicotine dependence: Secondary | ICD-10-CM

## 2024-11-30 DIAGNOSIS — G4733 Obstructive sleep apnea (adult) (pediatric): Secondary | ICD-10-CM

## 2024-11-30 DIAGNOSIS — J449 Chronic obstructive pulmonary disease, unspecified: Secondary | ICD-10-CM

## 2024-11-30 MED ORDER — ALBUTEROL SULFATE HFA 108 (90 BASE) MCG/ACT IN AERS: 1.0000 | INHALATION_SPRAY | Freq: Four times a day (QID) | RESPIRATORY_TRACT | 3 refills | Status: AC | PRN

## 2024-11-30 NOTE — Progress Notes (Signed)
 "  @Patient  ID: Robert Brown, male    DOB: July 07, 1951, 74 y.o.   MRN: [id number]  Chief Complaint  Patient presents with   Sleep Apnea    Referring provider: Bevely Doffing, FNP  HPI: Discussed the use of AI scribe software for clinical note transcription with the patient, who gave verbal consent to proceed.  History of Present Illness Jayson Waterhouse is a 74 year old male with atrial fibrillation and angina who presents for evaluation of CPAP compliance and respiratory management.  Compliance data indicates that he is using his CPAP machine daily and estimates he averages about eight and a half hours per night. He reports that his CPAP mask always leaks, but he continues to use it. His current mask is two weeks old, and his six-year-old machine is due for replacement as it has started shutting off during use.  He uses albuterol  approximately twice a year, primarily during seasonal changes, which helps manage respiratory symptoms. He previously used Trelegy but no longer has access to it. He is concerned about interactions between his inhaler and heart medications, especially since being diagnosed with atrial fibrillation.  He experiences angina about three times a week, with increased frequency. When angina is controlled, respiratory symptoms improve. He has had episodes of heart rate reaching 143 bpm and significantly elevated blood pressure, requiring nitroglycerin  and aspirin . He is currently on warfarin for atrial fibrillation.  He follows closely with Cardiology for this.  He expresses frustration with obtaining CPAP supplies and equipment, noting previous difficulties with insurance and supply companies. He is considering transitioning his care to a new facility due to frustration with access to cardiovascular and respiratory care.  Last visit telemed 04/29/2024: 74 year old male, former smoker followed for OSA on CPAP and COPD. He is followed by Dr. Darlean. Past medical  history significant for HFpEF, CAD, HTN, a fib RVR on Eliquis  and amio, hx of stroke, vertebral artery stenosis, OSA on CPAP, DM, hypothyroid, BPH, radiculopathy, hx of prostate cancer, HLD.   Patient presents today for follow-up.  At his last visit, we adjusted his CPAP pressures and also gave him a new mask.  Feels like the pressure change has helped.  Sleeping well for the most part.  Energy levels are okay during the day.  He is just dealing with A-fib right now, which does make him a little more fatigued.  No issues with drowsy driving or morning headaches.   Unfortunately, he has still not received updated supplies from the DME company.  Multiple orders have been sent.  He contacted them and they stated they still did not have an updated order. CMA has contact [employer] numerous times; faxed and emailed orders.   TEST/EVENTS:  06/2019 NPSG outside facility: RDI 33, SpO2 low 88%  Allergies[1]  Immunization History  Administered Date(s) Administered   Fluad Quad(high Dose 65+) 09/10/2020   Fluad Trivalent(High Dose 65+) 10/13/2023   INFLUENZA, HIGH DOSE SEASONAL PF 09/06/2018, 10/11/2019, 08/28/2021   PFIZER Comirnaty(Gray Top)Covid-19 Tri-Sucrose Vaccine 03/12/2020, 04/06/2020, 10/22/2020, 04/01/2021   PFIZER(Purple Top)SARS-COV-2 Vaccination 03/12/2020, 04/06/2020, 10/22/2020   Pfizer Covid-19 Vaccine Bivalent Booster 66yrs & up 09/04/2021   Pneumococcal Conjugate-13 06/18/2015   Pneumococcal Polysaccharide-23 06/23/2017, 06/23/2017   Tdap 02/22/2020    Past Medical History:  Diagnosis Date   CAD (coronary artery disease)    Cancer (HCC)    Diabetes mellitus without complication (HCC)    Hypertension    Stroke (HCC)     Tobacco History: Tobacco Use  History[2] Counseling given: Not Answered Tobacco comments: Former smoker 03/07/24   Outpatient Medications Prior to Visit  Medication Sig Dispense Refill   albuterol  (PROVENTIL ) (2.5 MG/3ML) 0.083% nebulizer solution Take 3 mLs  (2.5 mg total) by nebulization every 6 (six) hours as needed for wheezing or shortness of breath. 225 mL 3   Blood Glucose Monitoring Suppl (ACCU-CHEK GUIDE) w/Device KIT Use to check glucose once daily. 1 kit 0   busPIRone  (BUSPAR ) 5 MG tablet Take 5 mg by mouth daily as needed (for anxiety).     carvedilol  (COREG ) 25 MG tablet Take 1 tablet (25 mg total) by mouth 2 (two) times daily. 180 tablet 1   Continuous Glucose Sensor (FREESTYLE LIBRE 3 PLUS SENSOR) MISC Change sensor every 15 days. 6 each 3   finasteride  (PROSCAR ) 5 MG tablet Take 5 mg by mouth daily.     hydrALAZINE  (APRESOLINE ) 50 MG tablet Take 1 tablet (50 mg total) by mouth 3 (three) times daily. 270 tablet 3   isosorbide  mononitrate (IMDUR ) 120 MG 24 hr tablet TAKE 1 TABLET BY MOUTH DAILY 100 tablet 2   levothyroxine  (SYNTHROID ) 88 MCG tablet TAKE 1 TABLET BY MOUTH EVERY  MORNING 90 tablet 3   NIFEdipine  (PROCARDIA  XL/NIFEDICAL XL) 60 MG 24 hr tablet TAKE 1 TABLET BY MOUTH DAILY 90 tablet 2   nitroGLYCERIN  (NITROSTAT ) 0.4 MG SL tablet Place 1 tablet (0.4 mg total) under the tongue every 5 (five) minutes as needed for chest pain. If a single episode of chest pain is not relieved by one tablet, the patient will try another within 5 minutes; and if this doesn't relieve the pain, the patient is instructed to call 911 for transportation to an emergency department. 25 tablet 3   ondansetron  (ZOFRAN -ODT) 4 MG disintegrating tablet Take 1 tablet (4 mg total) by mouth every 8 (eight) hours as needed for nausea or vomiting. 20 tablet 0   rosuvastatin  (CRESTOR ) 40 MG tablet Take 1 tablet (40 mg total) by mouth at bedtime. 30 tablet 6   Semaglutide , 1 MG/DOSE, (OZEMPIC , 1 MG/DOSE,) 2 MG/1.5ML SOPN Inject 1 mg into the skin every Tuesday.     tamsulosin  (FLOMAX ) 0.4 MG CAPS capsule Take 0.4 mg by mouth 2 (two) times daily.     valsartan  (DIOVAN ) 320 MG tablet Take 1 tablet (320 mg total) by mouth daily. 90 tablet 3   warfarin (COUMADIN ) 5 MG tablet  TAKE 1/2 TO 1 TABLET BY MOUTH DAILY OR AS DIRECTED BY COUMADIN  CLINIC 30 tablet 3   albuterol  (VENTOLIN  HFA) 108 (90 Base) MCG/ACT inhaler Inhale 1-2 puffs into the lungs every 6 (six) hours as needed for shortness of breath or wheezing. 54 g 3   doxycycline  (VIBRAMYCIN ) 50 MG capsule Take 50 mg by mouth daily. (Patient not taking: Reported on 11/30/2024)     Evolocumab  (REPATHA  SURECLICK) 140 MG/ML SOAJ Inject 140 mg into the skin every 14 (fourteen) days. (Patient not taking: Reported on 11/30/2024) 2 mL 2   ezetimibe  (ZETIA ) 10 MG tablet Take 1 tablet (10 mg total) by mouth daily. (Patient not taking: Reported on 11/30/2024) 90 tablet 0   Fluticasone -Umeclidin-Vilant (TRELEGY ELLIPTA ) 100-62.5-25 MCG/ACT AEPB One click each am (Patient not taking: Reported on 11/30/2024) 3 each 3   No facility-administered medications prior to visit.     Review of Systems: as per hpi  Constitutional:   No  weight loss, night sweats,  Fevers, chills, fatigue, or  lassitude.  HEENT:   No headaches,  Difficulty swallowing,  Tooth/dental problems, or  Sore throat,                No sneezing, itching, ear ache, nasal congestion, post nasal drip,   CV:  No chest pain,  Orthopnea, PND, swelling in lower extremities, anasarca, dizziness, palpitations, syncope.   GI  No heartburn, indigestion, abdominal pain, nausea, vomiting, diarrhea, change in bowel habits, loss of appetite, bloody stools.   Resp: No shortness of breath with exertion or at rest.  No excess mucus, no productive cough,  No non-productive cough,  No coughing up of blood.  No change in color of mucus.  No wheezing.  No chest wall deformity  Skin: no rash or lesions.  GU: no dysuria, change in color of urine, no urgency or frequency.  No flank pain, no hematuria   MS:  No joint pain or swelling.  No decreased range of motion.  No back pain.    Physical Exam  BP 133/78   Pulse 79   Ht 6' (1.829 m)   Wt 214 lb (97.1 kg)   SpO2 96%   BMI  29.02 kg/m   GEN: A/Ox3; pleasant , NAD, well nourished    HEENT:  [place]/AT,  EACs-clear, TMs-wnl, NOSE-clear, THROAT-clear, no lesions, no postnasal drip or exudate noted.   NECK:  Supple w/ fair ROM; no JVD; normal carotid impulses w/o bruits; no thyromegaly or nodules palpated; no lymphadenopathy.    RESP  Clear  P & A; w/o, wheezes/ rales/ or rhonchi. no accessory muscle use, no dullness to percussion  CARD:  RRR, no m/r/g, no peripheral edema, pulses intact, no cyanosis or clubbing.  GI:   Soft & nt; nml bowel sounds; no organomegaly or masses detected.   Musco: Warm bil, no deformities or joint swelling noted.   Neuro: alert, no focal deficits noted.    Skin: Warm, no lesions or rashes    Lab Results:  CBC    Component Value Date/Time   WBC 9.2 02/26/2024 1340   WBC 9.3 02/17/2024 0508   RBC 4.86 02/26/2024 1340   RBC 4.69 02/17/2024 0508   HGB 13.0 02/26/2024 1340   HCT 41.0 02/26/2024 1340   PLT 156 02/26/2024 1340   MCV 84 02/26/2024 1340   MCH 26.7 02/26/2024 1340   MCH 26.9 02/17/2024 0508   MCHC 31.7 02/26/2024 1340   MCHC 32.3 02/17/2024 0508   RDW 13.3 02/26/2024 1340   LYMPHSABS 1.5 02/26/2024 1340   MONOABS 0.9 02/15/2024 2215   EOSABS 0.2 02/26/2024 1340   BASOSABS 0.0 02/26/2024 1340    BMET    Component Value Date/Time   NA 143 08/04/2024 1445   K 4.0 08/04/2024 1445   CL 105 08/04/2024 1445   CO2 25 08/04/2024 1445   GLUCOSE 88 08/04/2024 1445   GLUCOSE 114 (H) 03/07/2024 1434   BUN 15 08/04/2024 1445   CREATININE 1.29 (H) 08/04/2024 1445   CALCIUM  8.6 08/04/2024 1445   GFRNONAA 56 (L) 03/07/2024 1434   GFRAA >60 06/21/2020 1510    BNP    Component Value Date/Time   BNP 84.8 02/15/2024 2215    ProBNP No results found for: PROBNP  Imaging: No results found.  Administration History     None          Latest Ref Rng & Units 07/15/2022    1:57 PM  PFT Results  FVC-Pre L 2.04   FVC-Predicted Pre % 46   FVC-Post L 2.07    FVC-Predicted Post %  47   Pre FEV1/FVC % % 81   Post FEV1/FCV % % 79   FEV1-Pre L 1.66   FEV1-Predicted Pre % 51   FEV1-Post L 1.64   DLCO uncorrected ml/min/mmHg 15.87   DLCO UNC% % 61   DLVA Predicted % 121     No results found for: NITRICOXIDE   Assessment & Plan:   Assessment & Plan OSA (obstructive sleep apnea)  COPD GOLD 0  Assessment and Plan Assessment & Plan Obstructive sleep apnea Well-controlled with CPAP. Compliance excellent. AHI 0.5 events/hour. High leak due to facial hair and mask fit. CPAP machine six years old, shutting off during use. - Ordered new CPAP machine through [facility]. - Patient prefers [place] follow up if at all possible    Return in about 7 weeks (around 01/18/2025) for compliance download.  Candis Dandy, PA-C 11/30/2024      [1]  Allergies Allergen Reactions   Alcohol  Other (See Comments)    Prefers not to drink   Lisinopril     Medicines ending in -pril - unknown reaction   Statins     myalgia  [2]  Social History Tobacco Use  Smoking Status Former   Current packs/day: 0.00   Types: Cigarettes   Quit date: 11/17/2006   Years since quitting: 18.0  Smokeless Tobacco Never  Tobacco Comments   Former smoker 03/07/24   "

## 2024-11-30 NOTE — Patient Instructions (Signed)
 Follow up 6-8 weeks after receiving new machine for compliance download.  Prefers follow up in [place].  New CPAP ordered today.

## 2024-12-01 ENCOUNTER — Encounter: Payer: Self-pay | Admitting: Internal Medicine

## 2024-12-01 ENCOUNTER — Other Ambulatory Visit: Payer: Self-pay | Admitting: *Deleted

## 2024-12-01 MED ORDER — CARVEDILOL 25 MG PO TABS: 25.0000 mg | ORAL_TABLET | Freq: Two times a day (BID) | ORAL | 0 refills | Status: AC

## 2024-12-07 ENCOUNTER — Ambulatory Visit (INDEPENDENT_AMBULATORY_CARE_PROVIDER_SITE_OTHER)

## 2024-12-07 VITALS — BP 200/84 | HR 72 | Resp 14 | Ht 71.0 in | Wt 214.0 lb

## 2024-12-07 DIAGNOSIS — Z5320 Procedure and treatment not carried out because of patient's decision for unspecified reasons: Secondary | ICD-10-CM

## 2024-12-07 DIAGNOSIS — Z0000 Encounter for general adult medical examination without abnormal findings: Secondary | ICD-10-CM

## 2024-12-07 NOTE — Progress Notes (Signed)
 "  Screenings not ordered: Declined Referral/Order for DIABETIC KIDNEY EVALUATION, DIABETIC FOOT EXAM, HEP C SCREENING Chief Complaint  Patient presents with   Medicare Wellness     Subjective:   Robert Brown is a 74 y.o. male who presents for a Medicare Annual Wellness Visit.  Visit info / Clinical Intake: Medicare Wellness Visit Type:: Subsequent Annual Wellness Visit Persons participating in visit and providing information:: patient Medicare Wellness Visit Mode:: In-person (required for WTM) Interpreter Needed?: No Pre-visit prep was completed: yes AWV questionnaire completed by patient prior to visit?: yes Date:: 12/07/24 Living arrangements:: (Patient-Rptd) lives with spouse/significant other Patient's Overall Health Status Rating: (!) (Patient-Rptd) fair Typical amount of pain: (Patient-Rptd) some Does pain affect daily life?: (Patient-Rptd) no Are you currently prescribed opioids?: no  Dietary Habits and Nutritional Risks How many meals a day?: (Patient-Rptd) 2 Eats fruit and vegetables daily?: (!) (Patient-Rptd) no Most meals are obtained by: (Patient-Rptd) preparing own meals; eating out In the last 2 weeks, have you had any of the following?: none Diabetic:: (!) yes Any non-healing wounds?: no How often do you check your BS?: continuous glucose monitor Would you like to be referred to a Nutritionist or for Diabetic Management? : no  Functional Status Activities of Daily Living (to include ambulation/medication): (Patient-Rptd) Independent Ambulation: Independent Medication Administration: (Patient-Rptd) Independent Home Management (perform basic housework or laundry): (Patient-Rptd) Needs assistance (comment) Manage your own finances?: (Patient-Rptd) yes Primary transportation is: (Patient-Rptd) driving Concerns about vision?: no *vision screening is required for WTM* Concerns about hearing?: no  Fall Screening Falls in the past year?: (Patient-Rptd)  0 Number of falls in past year: 0 Was there an injury with Fall?: 0 Fall Risk Category Calculator: 0 Patient Fall Risk Level: Low Fall Risk  Fall Risk Patient at Risk for Falls Due to: No Fall Risks Fall risk Follow up: Falls evaluation completed; Education provided; Falls prevention discussed  Home and Transportation Safety: All rugs have non-skid backing?: (!) (Patient-Rptd) no All stairs or steps have railings?: (!) (Patient-Rptd) no Grab bars in the bathtub or shower?: (Patient-Rptd) yes Have non-skid surface in bathtub or shower?: (!) (Patient-Rptd) no Good home lighting?: (Patient-Rptd) yes Regular seat belt use?: (Patient-Rptd) yes Hospital stays in the last year:: (!) yes How many hospital stays:: 1 Reason: new onset afib  Cognitive Assessment Difficulty concentrating, remembering, or making decisions? : (Patient-Rptd) yes Will 6CIT or Mini Cog be Completed: no 6CIT or Mini Cog Declined: patient declined  Advance Directives (For Healthcare) Does Patient Have a Medical Advance Directive?: No Does patient want to make changes to medical advance directive?: No - Patient declined Type of Advance Directive: Healthcare Power of [place]; Living will Copy of Healthcare Power of Attorney in Chart?: No - copy requested Copy of Living Will in Chart?: No - copy requested Would patient like information on creating a medical advance directive?: No - Patient declined  Reviewed/Updated  Reviewed/Updated: Reviewed All (Medical, Surgical, Family, Medications, Allergies, Care Teams, Patient Goals)    Allergies (verified) Alcohol , Lisinopril, and Statins   Current Medications (verified) Outpatient Encounter Medications as of 12/07/2024  Medication Sig   albuterol  (PROVENTIL ) (2.5 MG/3ML) 0.083% nebulizer solution Take 3 mLs (2.5 mg total) by nebulization every 6 (six) hours as needed for wheezing or shortness of breath.   albuterol  (VENTOLIN  HFA) 108 (90 Base) MCG/ACT inhaler Inhale  1-2 puffs into the lungs every 6 (six) hours as needed for shortness of breath or wheezing.   Blood Glucose Monitoring Suppl (ACCU-CHEK GUIDE) w/Device  KIT Use to check glucose once daily.   busPIRone  (BUSPAR ) 5 MG tablet Take 5 mg by mouth daily as needed (for anxiety).   carvedilol  (COREG ) 25 MG tablet Take 1 tablet (25 mg total) by mouth 2 (two) times daily.   Continuous Glucose Sensor (FREESTYLE LIBRE 3 PLUS SENSOR) MISC Change sensor every 15 days.   finasteride  (PROSCAR ) 5 MG tablet Take 5 mg by mouth daily.   hydrALAZINE  (APRESOLINE ) 50 MG tablet Take 1 tablet (50 mg total) by mouth 3 (three) times daily.   isosorbide  mononitrate (IMDUR ) 120 MG 24 hr tablet TAKE 1 TABLET BY MOUTH DAILY   levothyroxine  (SYNTHROID ) 88 MCG tablet TAKE 1 TABLET BY MOUTH EVERY  MORNING   NIFEdipine  (PROCARDIA  XL/NIFEDICAL XL) 60 MG 24 hr tablet TAKE 1 TABLET BY MOUTH DAILY   nitroGLYCERIN  (NITROSTAT ) 0.4 MG SL tablet Place 1 tablet (0.4 mg total) under the tongue every 5 (five) minutes as needed for chest pain. If a single episode of chest pain is not relieved by one tablet, the patient will try another within 5 minutes; and if this doesn't relieve the pain, the patient is instructed to call 911 for transportation to an emergency department.   ondansetron  (ZOFRAN -ODT) 4 MG disintegrating tablet Take 1 tablet (4 mg total) by mouth every 8 (eight) hours as needed for nausea or vomiting.   rosuvastatin  (CRESTOR ) 40 MG tablet Take 1 tablet (40 mg total) by mouth at bedtime.   Semaglutide , 1 MG/DOSE, (OZEMPIC , 1 MG/DOSE,) 2 MG/1.5ML SOPN Inject 1 mg into the skin every Tuesday.   tamsulosin  (FLOMAX ) 0.4 MG CAPS capsule Take 0.4 mg by mouth 2 (two) times daily.   valsartan  (DIOVAN ) 320 MG tablet Take 1 tablet (320 mg total) by mouth daily.   warfarin (COUMADIN ) 5 MG tablet TAKE 1/2 TO 1 TABLET BY MOUTH DAILY OR AS DIRECTED BY COUMADIN  CLINIC   doxycycline  (VIBRAMYCIN ) 50 MG capsule Take 50 mg by mouth daily. (Patient  not taking: Reported on 11/30/2024)   Evolocumab  (REPATHA  SURECLICK) 140 MG/ML SOAJ Inject 140 mg into the skin every 14 (fourteen) days. (Patient not taking: Reported on 11/30/2024)   ezetimibe  (ZETIA ) 10 MG tablet Take 1 tablet (10 mg total) by mouth daily. (Patient not taking: Reported on 11/30/2024)   Fluticasone -Umeclidin-Vilant (TRELEGY ELLIPTA ) 100-62.5-25 MCG/ACT AEPB One click each am (Patient not taking: Reported on 11/30/2024)   No facility-administered encounter medications on file as of 12/07/2024.    History: Past Medical History:  Diagnosis Date   CAD (coronary artery disease)    Cancer (HCC)    Diabetes mellitus without complication (HCC)    Hypertension    Stroke [facility])    Past Surgical History:  Procedure Laterality Date   bone grafts     TUMOR EXCISION     Family History  Problem Relation Age of Onset   Diabetes Mother    Diabetes Father    Social History   Occupational History   Not on file  Tobacco Use   Smoking status: Former    Current packs/day: 0.00    Average packs/day: 1 pack/day for 5.0 years (5.0 ttl pk-yrs)    Types: Cigarettes    Quit date: 11/17/2006    Years since quitting: 18.0   Smokeless tobacco: Never   Tobacco comments:    Former smoker 03/07/24  Vaping Use   Vaping status: Never Used  Substance and Sexual Activity   Alcohol  use: Not Currently    Comment: former drinker   Drug use:  Never   Sexual activity: Not Currently   Tobacco Counseling Counseling given: Yes Tobacco comments: Former smoker 03/07/24  SDOH Screenings   Food Insecurity: Food Insecurity Present (12/07/2024)  Housing: High Risk (12/07/2024)  Transportation Needs: No Transportation Needs (12/07/2024)  Utilities: Not At Risk (12/07/2024)  Depression (PHQ2-9): High Risk (04/27/2024)  Financial Resource Strain: High Risk (12/07/2024)  Physical Activity: Inactive (12/07/2024)  Social Connections: Moderately Integrated (12/07/2024)  Stress: Stress Concern Present  (12/07/2024)  Tobacco Use: Medium Risk (12/07/2024)  Health Literacy: Adequate Health Literacy (12/07/2024)   See flowsheets for full screening details  Depression Screen Depression Screening Exception Documentation Depression Screening Exception:: Patient refusal  PHQ 2 & 9 Depression Scale- Over the past 2 weeks, how often have you been bothered by any of the following problems? Little interest or pleasure in doing things: 2 Feeling down, depressed, or hopeless (PHQ Adolescent also includes...irritable): 2 PHQ-2 Total Score: 4 Trouble falling or staying asleep, or sleeping too much: 2 Feeling tired or having little energy: 3 Poor appetite or overeating (PHQ Adolescent also includes...weight loss): 2 Feeling bad about yourself - or that you are a failure or have let yourself or your family down: 2 Trouble concentrating on things, such as reading the newspaper or watching television (PHQ Adolescent also includes...like school work): 2 Moving or speaking so slowly that other people could have noticed. Or the opposite - being so fidgety or restless that you have been moving around a lot more than usual: 2 Thoughts that you would be better off dead, or of hurting yourself in some way: 2 PHQ-9 Total Score: 19 If you checked off any problems, how difficult have these problems made it for you to do your work, take care of things at home, or get along with other people?: Somewhat difficult  Depression Treatment Depression Interventions/Treatment : Currently on Treatment     Goals Addressed               This Visit's Progress     remain active (pt-stated)               Objective:    Today's Vitals   12/07/24 1502 12/07/24 1503  BP: (!) 203/88 (!) 200/84  Pulse: 72   Resp: 14   SpO2: 94%   Weight: 214 lb (97.1 kg)   Height: 5' 11 (1.803 m)    Body mass index is 29.85 kg/m.  Hearing/Vision screen Hearing Screening - Comments:: Patient denies any hearing difficulties.    Vision Screening - Comments:: Patient is up to date with eye exams at [facility] Immunizations and Health Maintenance Health Maintenance  Topic Date Due   OPHTHALMOLOGY EXAM  Never done   Hepatitis C Screening  Never done   Zoster Vaccines- Shingrix (1 of 2) Never done   Influenza Vaccine  06/17/2024   COVID-19 Vaccine (9 - 2025-26 season) 07/18/2024   Medicare Annual Wellness (AWV)  07/27/2024   Diabetic kidney evaluation - Urine ACR  08/30/2024   FOOT EXAM  08/30/2024   HEMOGLOBIN A1C  02/05/2025   Diabetic kidney evaluation - eGFR measurement  08/04/2025   DTaP/Tdap/Td (2 - Td or Tdap) 02/21/2030   Colonoscopy  10/08/2031   Pneumococcal Vaccine: 50+ Years  Completed   Meningococcal B Vaccine  Aged Out        Assessment/Plan:  This is a routine wellness examination for Robert Brown.  Patient Care Team: Bevely Doffing, FNP as PCP - General (Family Medicine) Darlean Ozell NOVAK, MD as  Consulting Physician (Pulmonary Disease) Parrett, Madelin RAMAN, NP as Nurse Practitioner (Pulmonary Disease) Malachy Comer GAILS, NP as Nurse Practitioner (Nurse Practitioner) Stacia Diannah SQUIBB, MD as Consulting Physician (Cardiology) [facility] (Ophthalmology) Therisa Benton PARAS, NP as Nurse Practitioner (Endocrinology)  I have personally reviewed and noted the following in the patients chart:   Medical and social history Use of alcohol , tobacco or illicit drugs  Current medications and supplements including opioid prescriptions. Functional ability and status Nutritional status Physical activity Advanced directives List of other physicians Hospitalizations, surgeries, and ER visits in previous 12 months Vitals Screenings to include cognitive, depression, and falls Referrals and appointments  No orders of the defined types were placed in this encounter.  In addition, I have reviewed and discussed with patient certain preventive protocols, quality metrics, and best practice  recommendations. A written personalized care plan for preventive services as well as general preventive health recommendations were provided to patient.   Robert Brown, CMA   12/07/2024   No follow-ups on file.  After Visit Summary: (MyChart) Due to this being a telephonic visit, the after visit summary with patients personalized plan was offered to patient via MyChart   Nurse Notes:   PATIENT'S BLOOD PRESSURE WAS ELEVATED ( 203/88) ON FIRST READING. PATIENT WAS ALLOWED TO SIT WHILE REST OF VISIT WAS COMPLETED. BLOOD PRESSURE RECHECKED MANUALLY AND REMAINED ELEVATED (200/84). I ADVISED PATIENT THAT I WAS GOING TO SPEAK WITH LAURA HUENINK FNP REGARDING THE READINGS AND I'D BE BACK. LAURA AND I WENT BACK TO THE ROOM AND PATIENT HAD LEFT.   ATTEMPTED TO CALL PATIENT AND SPEAK WITH HIM AND ADVISE HIM THAT HE SHOULD GO TO THE EMERGENCY ROOM FOR FURTHER EVALUATION PER LAURA. NO ANSWER. LEFT VM. "

## 2024-12-07 NOTE — Patient Instructions (Signed)
 Mr. Robert Brown,  Thank you for taking the time for your Medicare Wellness Visit. I appreciate your continued commitment to your health goals. Please review the care plan we discussed, and feel free to reach out if I can assist you further.  Please note that Annual Wellness Visits do not include a physical exam. Some assessments may be limited, especially if the visit was conducted virtually. If needed, we may recommend an in-person follow-up with your provider.  Ongoing Care Seeing your primary care provider every 3 to 6 months helps us  monitor your health and provide consistent, personalized care.   AFTER SPEAKING WITH LAURA, NP REGARDING YOUR ELEVATED BLOOD PRESSURE READINGS TODAY, IT IS RECOMMENDED THAT YOU GO TO THE EMERGENCY ROOM FOR FURTHER EVALUATION.   Referrals If a referral was made during today's visit and you haven't received any updates within two weeks, please contact the referred provider directly to check on the status.  Recommended Screenings:  Health Maintenance  Topic Date Due   Eye exam for diabetics  Never done   Hepatitis C Screening  Never done   Zoster (Shingles) Vaccine (1 of 2) Never done   Flu Shot  06/17/2024   COVID-19 Vaccine (9 - 2025-26 season) 07/18/2024   Medicare Annual Wellness Visit  07/27/2024   Kidney health urinalysis for diabetes  08/30/2024   Complete foot exam   08/30/2024   Hemoglobin A1C  02/05/2025   Yearly kidney function blood test for diabetes  08/04/2025   DTaP/Tdap/Td vaccine (2 - Td or Tdap) 02/21/2030   Colon Cancer Screening  10/08/2031   Pneumococcal Vaccine for age over 9  Completed   Meningitis B Vaccine  Aged Out       12/07/2024   12:01 PM  Advanced Directives  Does Patient Have a Medical Advance Directive? No  Would patient like information on creating a medical advance directive? No - Patient declined    Vision: Annual vision screenings are recommended for early detection of glaucoma, cataracts, and diabetic  retinopathy. These exams can also reveal signs of chronic conditions such as diabetes and high blood pressure.  Dental: Annual dental screenings help detect early signs of oral cancer, gum disease, and other conditions linked to overall health, including heart disease and diabetes.  Please see the attached documents for additional preventive care recommendations.

## 2024-12-08 ENCOUNTER — Encounter: Payer: Self-pay | Admitting: Nurse Practitioner

## 2024-12-08 ENCOUNTER — Ambulatory Visit: Admitting: Nurse Practitioner

## 2024-12-08 VITALS — BP 130/68 | HR 87 | Ht 72.0 in | Wt 218.2 lb

## 2024-12-08 DIAGNOSIS — E119 Type 2 diabetes mellitus without complications: Secondary | ICD-10-CM

## 2024-12-08 DIAGNOSIS — Z7985 Long-term (current) use of injectable non-insulin antidiabetic drugs: Secondary | ICD-10-CM | POA: Diagnosis not present

## 2024-12-08 LAB — POCT GLYCOSYLATED HEMOGLOBIN (HGB A1C): Hemoglobin A1C: 6.4 % — AB (ref 4.0–5.6)

## 2024-12-08 MED ORDER — SEMAGLUTIDE (1 MG/DOSE) 4 MG/3ML ~~LOC~~ SOPN: 1.0000 mg | PEN_INJECTOR | SUBCUTANEOUS | 1 refills | Status: AC

## 2024-12-08 NOTE — Progress Notes (Signed)
 "                                                                        Endocrinology Follow Up Note       12/08/2024, 3:33 PM   Subjective:    Patient ID: Robert Brown, male    DOB: [birth year].  Robert Brown is being seen in follow up after being seen in consultation for management of currently uncontrolled symptomatic diabetes requested by  Bevely Doffing, FNP.   Past Medical History:  Diagnosis Date   CAD (coronary artery disease)    Cancer (HCC)    Diabetes mellitus without complication (HCC)    Hypertension    Stroke [facility])     Past Surgical History:  Procedure Laterality Date   bone grafts     TUMOR EXCISION      Social History   Socioeconomic History   Marital status: Married    Spouse name: Not on file   Number of children: Not on file   Years of education: Not on file   Highest education level: Bachelor's degree (e.g., BA, AB, BS)  Occupational History   Not on file  Tobacco Use   Smoking status: Former    Current packs/day: 0.00    Average packs/day: 1 pack/day for 5.0 years (5.0 ttl pk-yrs)    Types: Cigarettes    Quit date: 11/17/2006    Years since quitting: 18.0   Smokeless tobacco: Never   Tobacco comments:    Former smoker 03/07/24  Vaping Use   Vaping status: Never Used  Substance and Sexual Activity   Alcohol  use: Not Currently    Comment: former drinker   Drug use: Never   Sexual activity: Not Currently  Other Topics Concern   Not on file  Social History Narrative   Not on file   Social Drivers of Health   Tobacco Use: Medium Risk (12/08/2024)   Patient History    Smoking Tobacco Use: Former    Smokeless Tobacco Use: Never    Passive Exposure: Not on file  Financial Resource Strain: High Risk (12/07/2024)   Overall Financial Resource Strain (CARDIA)    Difficulty of Paying Living Expenses: Very hard  Food Insecurity: Food Insecurity Present (12/07/2024)   Epic    Worried About Programme Researcher, Broadcasting/film/video in the Last Year: Often  true    Ran Out of Food in the Last Year: Often true  Transportation Needs: No Transportation Needs (12/07/2024)   Epic    Lack of Transportation (Medical): No    Lack of Transportation (Non-Medical): No  Physical Activity: Inactive (12/07/2024)   Exercise Vital Sign    Days of Exercise per Week: 0 days    Minutes of Exercise per Session: 0 min  Stress: Stress Concern Present (12/07/2024)   [employer] of Occupational Health - Occupational Stress Questionnaire    Feeling of Stress: Rather much  Social Connections: Moderately Integrated (12/07/2024)   Social Connection and Isolation Panel    Frequency of Communication with Friends and Family: Three times a week    Frequency of Social Gatherings with Friends and Family: Three times a week    Attends Religious Services: More than 4 times per year  Active Member of Clubs or Organizations: No    Attends Banker Meetings: Never    Marital Status: Married  Depression (PHQ2-9): Low Risk (12/07/2024)   Depression (PHQ2-9)    PHQ-2 Score: 0  Alcohol  Screen: Not on file  Housing: High Risk (12/07/2024)   Epic    Unable to Pay for Housing in the Last Year: Yes    Number of Times Moved in the Last Year: 0    Homeless in the Last Year: No  Utilities: Not At Risk (12/07/2024)   Epic    Threatened with loss of utilities: No  Health Literacy: Adequate Health Literacy (12/07/2024)   B1300 Health Literacy    Frequency of need for help with medical instructions: Never    Family History  Problem Relation Age of Onset   Diabetes Mother    Diabetes Father     Outpatient Encounter Medications as of 12/08/2024  Medication Sig   albuterol  (VENTOLIN  HFA) 108 (90 Base) MCG/ACT inhaler Inhale 1-2 puffs into the lungs every 6 (six) hours as needed for shortness of breath or wheezing.   carvedilol  (COREG ) 25 MG tablet Take 1 tablet (25 mg total) by mouth 2 (two) times daily.   finasteride  (PROSCAR ) 5 MG tablet Take 5 mg by mouth daily.    hydrALAZINE  (APRESOLINE ) 50 MG tablet Take 1 tablet (50 mg total) by mouth 3 (three) times daily.   isosorbide  mononitrate (IMDUR ) 120 MG 24 hr tablet TAKE 1 TABLET BY MOUTH DAILY   levothyroxine  (SYNTHROID ) 88 MCG tablet TAKE 1 TABLET BY MOUTH EVERY  MORNING   NIFEdipine  (PROCARDIA  XL/NIFEDICAL XL) 60 MG 24 hr tablet TAKE 1 TABLET BY MOUTH DAILY   nitroGLYCERIN  (NITROSTAT ) 0.4 MG SL tablet Place 1 tablet (0.4 mg total) under the tongue every 5 (five) minutes as needed for chest pain. If a single episode of chest pain is not relieved by one tablet, the patient will try another within 5 minutes; and if this doesn't relieve the pain, the patient is instructed to call 911 for transportation to an emergency department.   ondansetron  (ZOFRAN -ODT) 4 MG disintegrating tablet Take 1 tablet (4 mg total) by mouth every 8 (eight) hours as needed for nausea or vomiting.   rosuvastatin  (CRESTOR ) 40 MG tablet Take 1 tablet (40 mg total) by mouth at bedtime.   Semaglutide , 1 MG/DOSE, 4 MG/3ML SOPN Inject 1 mg as directed once a week.   tamsulosin  (FLOMAX ) 0.4 MG CAPS capsule Take 0.4 mg by mouth 2 (two) times daily.   valsartan  (DIOVAN ) 320 MG tablet Take 1 tablet (320 mg total) by mouth daily.   warfarin (COUMADIN ) 5 MG tablet TAKE 1/2 TO 1 TABLET BY MOUTH DAILY OR AS DIRECTED BY COUMADIN  CLINIC   [DISCONTINUED] Semaglutide , 1 MG/DOSE, (OZEMPIC , 1 MG/DOSE,) 2 MG/1.5ML SOPN Inject 1 mg into the skin every Tuesday.   [DISCONTINUED] albuterol  (PROVENTIL ) (2.5 MG/3ML) 0.083% nebulizer solution Take 3 mLs (2.5 mg total) by nebulization every 6 (six) hours as needed for wheezing or shortness of breath. (Patient not taking: Reported on 12/08/2024)   [DISCONTINUED] Blood Glucose Monitoring Suppl (ACCU-CHEK GUIDE) w/Device KIT Use to check glucose once daily. (Patient not taking: Reported on 12/08/2024)   [DISCONTINUED] busPIRone  (BUSPAR ) 5 MG tablet Take 5 mg by mouth daily as needed (for anxiety). (Patient not taking:  Reported on 12/08/2024)   [DISCONTINUED] Continuous Glucose Sensor (FREESTYLE LIBRE 3 PLUS SENSOR) MISC Change sensor every 15 days. (Patient not taking: Reported on 12/08/2024)   [DISCONTINUED] doxycycline  (  VIBRAMYCIN ) 50 MG capsule Take 50 mg by mouth daily. (Patient not taking: Reported on 12/08/2024)   [DISCONTINUED] Evolocumab  (REPATHA  SURECLICK) 140 MG/ML SOAJ Inject 140 mg into the skin every 14 (fourteen) days. (Patient not taking: Reported on 12/08/2024)   [DISCONTINUED] ezetimibe  (ZETIA ) 10 MG tablet Take 1 tablet (10 mg total) by mouth daily. (Patient not taking: Reported on 12/08/2024)   [DISCONTINUED] Fluticasone -Umeclidin-Vilant (TRELEGY ELLIPTA ) 100-62.5-25 MCG/ACT AEPB One click each am (Patient not taking: Reported on 12/08/2024)   No facility-administered encounter medications on file as of 12/08/2024.    ALLERGIES: Allergies  Allergen Reactions   Alcohol  Other (See Comments)    Prefers not to drink   Lisinopril     Medicines ending in -pril - unknown reaction   Statins     myalgia    VACCINATION STATUS: Immunization History  Administered Date(s) Administered   Fluad Quad(high Dose 65+) 09/10/2020   Fluad Trivalent(High Dose 65+) 10/13/2023   INFLUENZA, HIGH DOSE SEASONAL PF 09/06/2018, 10/11/2019, 08/28/2021   PFIZER Comirnaty(Gray Top)Covid-19 Tri-Sucrose Vaccine 03/12/2020, 04/06/2020, 10/22/2020, 04/01/2021   PFIZER(Purple Top)SARS-COV-2 Vaccination 03/12/2020, 04/06/2020, 10/22/2020   Pfizer Covid-19 Vaccine Bivalent Booster 57yrs & up 09/04/2021   Pneumococcal Conjugate-13 06/18/2015   Pneumococcal Polysaccharide-23 06/23/2017, 06/23/2017   Tdap 02/22/2020    Diabetes He presents for his follow-up diabetic visit. He has type 2 diabetes mellitus. His disease course has been stable. There are no hypoglycemic associated symptoms. There are no diabetic associated symptoms. There are no hypoglycemic complications. Symptoms are stable. Diabetic complications include a  CVA, heart disease (CAD and CHF), nephropathy and peripheral neuropathy. Risk factors for coronary artery disease include diabetes mellitus, dyslipidemia, family history, obesity, male sex, hypertension and sedentary lifestyle. Current diabetic treatments: Ozempic  only. He is compliant with treatment most of the time. His weight is fluctuating minimally. He is following a generally healthy diet. When asked about meal planning, he reported none. He has not had a previous visit with a dietitian. He rarely participates in exercise. (He presents today with no meter or logs to review.  He was not asked to routinely monitor glucose given safe regimen.  His POCT A1c today is 6.4%, essentially unchanged from last visit.  He does note he has been sick between visits and admits he cheats on his diet from time to time.) An ACE inhibitor/angiotensin II receptor blocker is being taken. He does not see a podiatrist.Eye exam is current.     Review of systems  Constitutional: +stable body weight, current Body mass index is 29.59 kg/m., no fatigue, no subjective hyperthermia, no subjective hypothermia Eyes: no blurry vision, no xerophthalmia ENT: no sore throat, no nodules palpated in throat, no dysphagia/odynophagia, no hoarseness Cardiovascular: no chest pain, no shortness of breath, no palpitations, no leg swelling Respiratory: no cough, no shortness of breath Gastrointestinal: no nausea/vomiting/diarrhea Musculoskeletal: no muscle/joint aches Skin: no rashes, no hyperemia Neurological: no tremors, no numbness, no tingling, no dizziness Psychiatric: no depression, no anxiety  Objective:     BP 130/68 (BP Location: Right Arm, Patient Position: Sitting, Cuff Size: Large)   Pulse 87   Ht 6' (1.829 m)   Wt 218 lb 3.2 oz (99 kg)   BMI 29.59 kg/m   Wt Readings from Last 3 Encounters:  12/08/24 218 lb 3.2 oz (99 kg)  12/07/24 214 lb (97.1 kg)  11/30/24 214 lb (97.1 kg)     BP Readings from Last 3  Encounters:  12/08/24 130/68  12/07/24 (!) 200/84  11/30/24 133/78  Physical Exam- Limited  Constitutional:  Body mass index is 29.59 kg/m. , not in acute distress, normal state of mind Eyes:  EOMI, no exophthalmos Musculoskeletal: no gross deformities, strength intact in all four extremities, no gross restriction of joint movements Skin:  no rashes, no hyperemia Neurological: no tremor with outstretched hands   Diabetic Foot Exam - Simple   No data filed      CMP ( most recent) CMP     Component Value Date/Time   NA 143 08/04/2024 1445   K 4.0 08/04/2024 1445   CL 105 08/04/2024 1445   CO2 25 08/04/2024 1445   GLUCOSE 88 08/04/2024 1445   GLUCOSE 114 (H) 03/07/2024 1434   BUN 15 08/04/2024 1445   CREATININE 1.29 (H) 08/04/2024 1445   CALCIUM  8.6 08/04/2024 1445   PROT 6.4 08/04/2024 1445   ALBUMIN 3.9 08/04/2024 1445   AST 21 08/04/2024 1445   ALT 16 08/04/2024 1445   ALKPHOS 90 08/04/2024 1445   BILITOT 0.3 08/04/2024 1445   GFRNONAA 56 (L) 03/07/2024 1434   GFRAA >60 06/21/2020 1510     Diabetic Labs (most recent): Lab Results  Component Value Date   HGBA1C 6.4 (A) 12/08/2024   HGBA1C 6.3 (A) 08/08/2024   HGBA1C 6.6 (H) 02/16/2024   MICROALBUR 30mg /L 08/31/2023     Lipid Panel ( most recent) Lipid Panel     Component Value Date/Time   CHOL 175 04/01/2024 1521   TRIG 118 04/01/2024 1521   HDL 49 04/01/2024 1521   CHOLHDL 3.6 04/01/2024 1521   LDLCALC 105 (H) 04/01/2024 1521   LABVLDL 21 04/01/2024 1521      Lab Results  Component Value Date   TSH 4.690 (H) 08/04/2024   TSH 5.110 (H) 04/01/2024   TSH 3.560 02/26/2024   TSH 6.043 (H) 02/15/2024   TSH 3.110 08/24/2023   TSH 3.96 08/04/2022   FREET4 1.52 08/04/2024   FREET4 1.61 04/01/2024   FREET4 1.62 02/26/2024   FREET4 0.92 02/15/2024   FREET4 1.33 08/24/2023         Latest Reference Range & Units 02/15/24 22:15 02/26/24 13:40 04/01/24 15:21 08/04/24 14:45  TSH 0.450 -  4.500 uIU/mL 6.043 (H) 3.560 5.110 (H) 4.690 (H)  T4,Free(Direct) 0.82 - 1.77 ng/dL 9.07 8.37 8.38 8.47  (H): Data is abnormally high   Assessment & Plan:   1) Type 2 Diabetes mellitus without complication without long-term current use of insulin   He presents today with no meter or logs to review.  He was not asked to routinely monitor glucose given safe regimen.  His POCT A1c today is 6.4%, essentially unchanged from last visit.  He does note he has been sick between visits and admits he cheats on his diet from time to time.  - Robert Brown has currently uncontrolled symptomatic type 2 DM since 74 years of age.   -Recent labs reviewed.  - I had a long discussion with him about the progressive nature of diabetes and the pathology behind its complications. -his diabetes is complicated by CAD with MI, CVA, CKD stage 2, neuropathy and he remains at a high risk for more acute and chronic complications which include CAD, CVA, CKD, retinopathy, and neuropathy. These are all discussed in detail with him.  The following Lifestyle Medicine recommendations according to American College of Lifestyle Medicine [facility]) were discussed and offered to patient and he agrees to start the journey:  A. Whole Foods, Plant-based plate comprising of fruits and vegetables, plant-based  proteins, whole-grain carbohydrates was discussed in detail with the patient.   A list for source of those nutrients were also provided to the patient.  Patient will use only water or unsweetened tea for hydration. B.  The need to stay away from risky substances including alcohol , smoking; obtaining 7 to 9 hours of restorative sleep, at least 150 minutes of moderate intensity exercise weekly, the importance of healthy social connections,  and stress reduction techniques were discussed. C.  A full color page of  Calorie density of various food groups per pound showing examples of each food groups was provided to the patient.  -  Nutritional counseling repeated/built upon at each appointment.  - The patient admits there is a room for improvement in their diet and drink choices. -  Suggestion is made for the patient to avoid simple carbohydrates from their diet including Cakes, Sweet Desserts / Pastries, Ice Cream, Soda (diet and regular), Sweet Tea, Candies, Chips, Cookies, Sweet Pastries, Store Bought Juices, Alcohol  in Excess of 1-2 drinks a day, Artificial Sweeteners, Coffee Creamer, and Sugar-free Products. This will help patient to have stable blood glucose profile and potentially avoid unintended weight gain.   - I encouraged the patient to switch to unprocessed or minimally processed complex starch and increased protein intake (animal or plant source), fruits, and vegetables.   - Patient is advised to stick to a routine mealtimes to eat 3 meals a day and avoid unnecessary snacks (to snack only to correct hypoglycemia).  - I have approached him with the following individualized plan to manage his diabetes and patient agrees:   -He is advised to continue Ozempic  1 mg SQ weekly.  Will hold off on advancing dose since he has done so well on it.  He could most certainly benefit from GLP1 product given his extensive CAD history with MI and CVA in the past.   -he can take a break from routine monitoring for now as he is on safe regimen.   His insurance did not cover CGM due to not being on insulin .  He does have a few sensors left over- I encouraged him to use them during the holiday season so he can keep an eye on sugar during some of the toughest months.  - he is warned not to take insulin  without proper monitoring per orders. - Adjustment parameters are given to him for hypo and hyperglycemia in writing.  - he is not a candidate for full dose Metformin  due to concurrent renal insufficiency.  - Specific targets for  A1c; LDL, HDL, and Triglycerides were discussed with the patient.  2) Blood Pressure /Hypertension:   his blood pressure is controlled to target.   he is advised to continue his current medications as prescribed by PCP/cardiology.  3) Lipids/Hyperlipidemia:    Review of his recent lipid panel from 04/01/24 showed uncontrolled LDL at 105 (improving).  he is advised to continue Crestor  20 mg daily at bedtime.  Side effects and precautions discussed with him.  His cardiologist is looking in to getting an injectable for him through [facility].  4)  Weight/Diet:  his Body mass index is 29.59 kg/m.  -  clearly complicating his diabetes care.   he is a candidate for weight loss. I discussed with him the fact that loss of 5 - 10% of his  current body weight will have the most impact on his diabetes management.  Exercise, and detailed carbohydrates information provided  -  detailed on discharge instructions.  5)  Hypothyroidism-unspecified The details surrounding his diagnosis are not available.  He denies any known family history of thyroid  problems.   There are no recent TFTs to review.  He is advised to continue Levothyroxine  88 mcg po daily before breakfast.  Will recheck TFTs prior to next visit and adjust dose accordingly.   - The correct intake of thyroid  hormone (Levothyroxine , Synthroid ), is on empty stomach first thing in the morning, with water, separated by at least 30 minutes from breakfast and other medications,  and separated by more than 4 hours from calcium , iron, multivitamins, acid reflux medications (PPIs).  - This medication is a life-long medication and will be needed to correct thyroid  hormone imbalances for the rest of your life.  The dose may change from time to time, based on thyroid  blood work.  - It is extremely important to be consistent taking this medication, near the same time each morning.  -AVOID TAKING PRODUCTS CONTAINING BIOTIN (commonly found in Hair, Skin, Nails vitamins) AS IT INTERFERES WITH THE VALIDITY OF THYROID  FUNCTION BLOOD TESTS.  6) Chronic Care/Health  Maintenance: -he is on ACEI/ARB and Statin medications and is encouraged to initiate and continue to follow up with Ophthalmology, Dentist, Podiatrist at least yearly or according to recommendations, and advised to stay away from smoking. I have recommended yearly flu vaccine and pneumonia vaccine at least every 5 years; moderate intensity exercise for up to 150 minutes weekly; and sleep for at least 7 hours a day.  - he is advised to maintain close follow up with Bevely Doffing, FNP for primary care needs, as well as his other providers for optimal and coordinated care.     I spent  42  minutes in the care of the patient today including review of labs from CMP, Lipids, Thyroid  Function, Hematology (current and previous including abstractions from other facilities); face-to-face time discussing  his blood glucose readings/logs, discussing hypoglycemia and hyperglycemia episodes and symptoms, medications doses, his options of short and long term treatment based on the latest standards of care / guidelines;  discussion about incorporating lifestyle medicine;  and documenting the encounter. Risk reduction counseling performed per USPSTF guidelines to reduce obesity and cardiovascular risk factors.     Please refer to Patient Instructions for Blood Glucose Monitoring and Insulin /Medications Dosing Guide  in media tab for additional information. Please  also refer to  Patient Self Inventory in the Media  tab for reviewed elements of pertinent patient history.  Robert Brown participated in the discussions, expressed understanding, and voiced agreement with the above plans.  All questions were answered to his satisfaction. he is encouraged to contact clinic should he have any questions or concerns prior to his return visit.     Follow up plan: - Return in about 4 months (around 04/07/2025) for Diabetes F/U with A1c in office, No previsit labs.  Benton Rio, [facility] [address] [place], [place] [address] Phone: [phone] Fax: [phone]  12/08/2024, 3:33 PM    "

## 2024-12-09 ENCOUNTER — Telehealth (HOSPITAL_BASED_OUTPATIENT_CLINIC_OR_DEPARTMENT_OTHER): Payer: Self-pay

## 2024-12-09 NOTE — Telephone Encounter (Signed)
 Copied from CRM [id number]. Topic: Clinical - Order For Equipment >> Dec 09, 2024 12:03 PM [name] wrote: Reason for CRM: Sana with [facility] stated the pt order  Was cancelled due to insurance inactive

## 2024-12-13 ENCOUNTER — Encounter (HOSPITAL_BASED_OUTPATIENT_CLINIC_OR_DEPARTMENT_OTHER): Payer: Self-pay

## 2024-12-16 ENCOUNTER — Ambulatory Visit: Admitting: Internal Medicine

## 2024-12-26 ENCOUNTER — Encounter: Admitting: Pulmonary Disease

## 2024-12-27 ENCOUNTER — Ambulatory Visit

## 2024-12-27 ENCOUNTER — Ambulatory Visit: Admitting: Nurse Practitioner

## 2025-01-18 ENCOUNTER — Ambulatory Visit (HOSPITAL_BASED_OUTPATIENT_CLINIC_OR_DEPARTMENT_OTHER)

## 2025-01-25 ENCOUNTER — Ambulatory Visit

## 2025-02-15 ENCOUNTER — Ambulatory Visit: Admitting: Internal Medicine

## 2025-02-28 ENCOUNTER — Ambulatory Visit: Admitting: Internal Medicine

## 2025-04-11 ENCOUNTER — Ambulatory Visit: Admitting: Nurse Practitioner
